# Patient Record
Sex: Female | Born: 1960 | Race: Black or African American | Hispanic: No | Marital: Married | Smoking: Current every day smoker
Health system: Southern US, Community
[De-identification: ages and names within clinical notes are randomized; demographics above are authoritative.]

## PROBLEM LIST (undated history)

## (undated) DIAGNOSIS — I10 Essential (primary) hypertension: Secondary | ICD-10-CM

## (undated) DIAGNOSIS — G4730 Sleep apnea, unspecified: Secondary | ICD-10-CM

## (undated) DIAGNOSIS — Z720 Tobacco use: Secondary | ICD-10-CM

## (undated) DIAGNOSIS — J45909 Unspecified asthma, uncomplicated: Secondary | ICD-10-CM

## (undated) DIAGNOSIS — E119 Type 2 diabetes mellitus without complications: Secondary | ICD-10-CM

## (undated) DIAGNOSIS — M5430 Sciatica, unspecified side: Secondary | ICD-10-CM

## (undated) DIAGNOSIS — J189 Pneumonia, unspecified organism: Secondary | ICD-10-CM

## (undated) DIAGNOSIS — K219 Gastro-esophageal reflux disease without esophagitis: Secondary | ICD-10-CM

## (undated) DIAGNOSIS — G40909 Epilepsy, unspecified, not intractable, without status epilepticus: Secondary | ICD-10-CM

## (undated) DIAGNOSIS — E785 Hyperlipidemia, unspecified: Secondary | ICD-10-CM

## (undated) HISTORY — PX: ENDOVENOUS ABLATION SAPHENOUS VEIN W/ LASER: SUR449

## (undated) HISTORY — DX: Gastro-esophageal reflux disease without esophagitis: K21.9

## (undated) HISTORY — PX: JOINT REPLACEMENT: SHX530

## (undated) HISTORY — PX: FOOT SURGERY: SHX648

## (undated) HISTORY — DX: Type 2 diabetes mellitus without complications: E11.9

## (undated) HISTORY — DX: Unspecified asthma, uncomplicated: J45.909

## (undated) HISTORY — DX: Essential (primary) hypertension: I10

## (undated) HISTORY — DX: Tobacco use: Z72.0

## (undated) HISTORY — PX: REPLACEMENT TOTAL KNEE BILATERAL: SUR1225

---

## 2012-01-04 HISTORY — PX: TOTAL KNEE ARTHROPLASTY: SHX125

## 2013-01-03 HISTORY — PX: ENDOVENOUS ABLATION SAPHENOUS VEIN W/ LASER: SUR449

## 2013-01-03 HISTORY — PX: COLONOSCOPY: SHX174

## 2015-01-04 HISTORY — PX: TOTAL KNEE ARTHROPLASTY: SHX125

## 2019-02-27 ENCOUNTER — Other Ambulatory Visit (HOSPITAL_COMMUNITY): Payer: Self-pay | Admitting: General Practice

## 2019-02-27 DIAGNOSIS — Z1231 Encounter for screening mammogram for malignant neoplasm of breast: Secondary | ICD-10-CM

## 2019-03-05 ENCOUNTER — Other Ambulatory Visit: Payer: Self-pay

## 2019-03-05 ENCOUNTER — Encounter: Payer: Self-pay | Admitting: Orthopaedic Surgery

## 2019-03-05 ENCOUNTER — Ambulatory Visit: Payer: Medicaid Other | Admitting: Orthopaedic Surgery

## 2019-03-05 VITALS — BP 125/77 | HR 85 | Temp 97.2°F | Ht 62.0 in | Wt 232.0 lb

## 2019-03-05 DIAGNOSIS — M25512 Pain in left shoulder: Secondary | ICD-10-CM | POA: Diagnosis not present

## 2019-03-05 DIAGNOSIS — G8929 Other chronic pain: Secondary | ICD-10-CM | POA: Diagnosis not present

## 2019-03-05 NOTE — Progress Notes (Signed)
Subjective:    Patient ID: Alyssa Patrick, female    DOB: 11/27/1960, 59 y.o.   MRN: [id number]  HPI She has four year history of left shoulder pain.  She fell and hurt her shoulder then while living in [place].  She had another injury shortly after the first one.  She was evaluated in [place].  She had MRI there.  She was told she might have rotator cuff injury.  She has now moved to [facility].  I have reviewed the notes from her family doctor and have reviewed the x-rays.  I have independently reviewed and interpreted x-rays of this patient done at another site by another physician or qualified health professional.  She has pain in the left shoulder now with most any motion.  She is taking Mobic.  She uses ice, heat,rubs with no help. She has no numbness. She has no neck pain,no redness.  She has no right shoulder pain.   Review of Systems  Constitutional: Positive for activity change.  Musculoskeletal: Positive for arthralgias, joint swelling and myalgias.  All other systems reviewed and are negative.  For Review of Systems, all other systems reviewed and are negative.  The following is a summary of the past history medically, past history surgically, known current medicines, social history and family history.  This information is gathered electronically by the computer from prior information and documentation.  I review this each visit and have found including this information at this point in the chart is beneficial and informative.   History reviewed. No pertinent past medical history.  Past Surgical History:  Procedure Laterality Date  . FOOT SURGERY    . JOINT REPLACEMENT      Current Outpatient Medications on File Prior to Visit  Medication Sig Dispense Refill  . acetaminophen (TYLENOL) 500 MG tablet Take 500 mg by mouth every 6 (six) hours as needed.    Marland Kitchen albuterol (VENTOLIN HFA) 108 (90 Base) MCG/ACT inhaler Inhale into the lungs.    Marland Kitchen amLODipine (NORVASC) 5 MG tablet Take by  mouth.    . bisacodyl (DULCOLAX) 5 MG EC tablet Take 5 mg by mouth daily as needed for moderate constipation.    . cetirizine (ZYRTEC) 10 MG tablet Take 10 mg by mouth daily.    . clotrimazole (LOTRIMIN) 1 % cream Apply 1 application topically 2 (two) times daily.    . Diclofenac & Menthol-Camphor 75 & 3-3 MG & % THPK by Combination route.    . diphenhydrAMINE (SOMINEX) 25 MG tablet Take 25 mg by mouth at bedtime as needed for sleep.    . divalproex (DEPAKOTE ER) 250 MG 24 hr tablet Take 250 mg by mouth daily.    . ergocalciferol (VITAMIN D2) 1.25 MG (50000 UT) capsule Take 50,000 Units by mouth once a week.    . Fluticasone-Salmeterol (ADVAIR) 100-50 MCG/DOSE AEPB Inhale into the lungs.    . hydrocortisone 2.5 % cream Apply topically 2 (two) times daily.    Marland Kitchen ipratropium-albuterol (DUONEB) 0.5-2.5 (3) MG/3ML SOLN Take 3 mLs by nebulization.    . meloxicam (MOBIC) 15 MG tablet Take by mouth.    . metFORMIN (GLUCOPHAGE) 1000 MG tablet Take by mouth.    . nicotine (NICODERM CQ - DOSED IN MG/24 HOURS) 21 mg/24hr patch Place 21 mg onto the skin daily.    . pantoprazole (PROTONIX) 40 MG tablet Take 40 mg by mouth daily.     No current facility-administered medications on file prior to visit.  Social History   Socioeconomic History  . Marital status: Single    Spouse name: Not on file  . Number of children: Not on file  . Years of education: Not on file  . Highest education level: Not on file  Occupational History  . Not on file  Tobacco Use  . Smoking status: Not on file  Substance and Sexual Activity  . Alcohol use: Not on file  . Drug use: Not on file  . Sexual activity: Not on file  Other Topics Concern  . Not on file  Social History Narrative  . Not on file   Social Determinants of Health   Financial Resource Strain:   . Difficulty of Paying Living Expenses: Not on file  Food Insecurity:   . Worried About Programme researcher, broadcasting/film/video in the Last Year: Not on file  . Ran Out of  Food in the Last Year: Not on file  Transportation Needs:   . Lack of Transportation (Medical): Not on file  . Lack of Transportation (Non-Medical): Not on file  Physical Activity:   . Days of Exercise per Week: Not on file  . Minutes of Exercise per Session: Not on file  Stress:   . Feeling of Stress : Not on file  Social Connections:   . Frequency of Communication with Friends and Family: Not on file  . Frequency of Social Gatherings with Friends and Family: Not on file  . Attends Religious Services: Not on file  . Active Member of Clubs or Organizations: Not on file  . Attends Banker Meetings: Not on file  . Marital Status: Not on file  Intimate Partner Violence:   . Fear of Current or Ex-Partner: Not on file  . Emotionally Abused: Not on file  . Physically Abused: Not on file  . Sexually Abused: Not on file    Family History  Problem Relation Age of Onset  . Cancer Father   . Cancer Brother   . Cancer Paternal Aunt   . Diabetes Maternal Grandmother     BP 125/77   Pulse 85   Temp (!) 97.2 F (36.2 C)   Ht 5\' 2"  (1.575 m)   Wt 232 lb (105.2 kg)   BMI 42.43 kg/m   Body mass index is 42.43 kg/m.     Objective:   Physical Exam Vitals and nursing note reviewed.  Constitutional:      Appearance: She is well-developed.  HENT:     Head: Normocephalic and atraumatic.  Eyes:     Conjunctiva/sclera: Conjunctivae normal.     Pupils: Pupils are equal, round, and reactive to light.  Cardiovascular:     Rate and Rhythm: Normal rate and regular rhythm.  Pulmonary:     Effort: Pulmonary effort is normal.  Abdominal:     Palpations: Abdomen is soft.  Musculoskeletal:       Arms:     Cervical back: Normal range of motion and neck supple.  Skin:    General: Skin is warm and dry.  Neurological:     Mental Status: She is alert and oriented to person, place, and time.     Cranial Nerves: No cranial nerve deficit.     Motor: No abnormal muscle tone.      Coordination: Coordination normal.     Deep Tendon Reflexes: Reflexes are normal and symmetric. Reflexes normal.  Psychiatric:        Behavior: Behavior normal.        Thought  Content: Thought content normal.        Judgment: Judgment normal.           Assessment & Plan:   Encounter Diagnosis  Name Primary?  . Chronic left shoulder pain Yes   PROCEDURE NOTE:  The patient request injection, verbal consent was obtained.  The left shoulder was prepped appropriately after time out was performed.   Sterile technique was observed and injection of 1 cc of Depo-Medrol 40 mg with several cc's of plain xylocaine. Anesthesia was provided by ethyl chloride and a 20-gauge needle was used to inject the shoulder area. A posterior approach was used.  The injection was tolerated well.  A band aid dressing was applied.  The patient was advised to apply ice later today and tomorrow to the injection sight as needed.  I will get a MRI of the left shoulder.  I am concerned about rotator cuff tear or adhesive capsulitis.  Return after the MRI.  Call if any problem.  Precautions discussed.   Electronically Signed Sanjuana Kava, MD 3/2/20213:02 PM

## 2019-03-18 DIAGNOSIS — R569 Unspecified convulsions: Secondary | ICD-10-CM | POA: Insufficient documentation

## 2019-04-02 ENCOUNTER — Other Ambulatory Visit: Payer: Self-pay

## 2019-04-02 ENCOUNTER — Ambulatory Visit
Admission: RE | Admit: 2019-04-02 | Discharge: 2019-04-02 | Disposition: A | Discharge status: Home / Self Care | Payer: Medicaid Other | Source: Ambulatory Visit | Attending: Orthopaedic Surgery | Admitting: Orthopaedic Surgery

## 2019-04-02 DIAGNOSIS — G8929 Other chronic pain: Secondary | ICD-10-CM

## 2019-04-02 DIAGNOSIS — M25512 Pain in left shoulder: Secondary | ICD-10-CM

## 2019-04-16 ENCOUNTER — Ambulatory Visit: Payer: Medicaid Other | Admitting: Orthopaedic Surgery

## 2019-04-16 ENCOUNTER — Other Ambulatory Visit: Payer: Self-pay

## 2019-04-16 ENCOUNTER — Encounter: Payer: Self-pay | Admitting: Orthopaedic Surgery

## 2019-04-16 DIAGNOSIS — G8929 Other chronic pain: Secondary | ICD-10-CM

## 2019-04-16 DIAGNOSIS — M25512 Pain in left shoulder: Secondary | ICD-10-CM

## 2019-04-16 MED ORDER — HYDROCODONE-ACETAMINOPHEN 5-325 MG PO TABS: 1.0000 | ORAL_TABLET | ORAL | 0 refills | Status: AC | PRN

## 2019-04-16 NOTE — Progress Notes (Signed)
[name], female DOB:[birth year], 59 y.o. PPJ:[id number]  Chief Complaint  Patient presents with  . Results    review MRI left shoulder     HPI  Alyssa Patrick is a 59 y.o. female who has continued left shoulder pain.  She had MRI which showed: IMPRESSION: 1. Moderate tendinosis of the supraspinatus tendon with a high-grade partial thickness near complete tear with a few intact fibers with the torn portions retracted 12 mm. 2. Moderate tendinosis of the infraspinatus tendon with a small interstitial tear at the musculotendinous junction. 3. Severe atrophy of the infraspinatus muscle.  I explained the findings to her.  I have independently reviewed the MRI.  I will have her see Dr. Aline Patrick to discuss possible surgery.  She is very agreeable to this.   There is no height or weight on file to calculate BMI.  ROS  Review of Systems  Constitutional: Positive for activity change.  Musculoskeletal: Positive for arthralgias, joint swelling and myalgias.  All other systems reviewed and are negative.   All other systems reviewed and are negative.  The following is a summary of the past history medically, past history surgically, known current medicines, social history and family history.  This information is gathered electronically by the computer from prior information and documentation.  I review this each visit and have found including this information at this point in the chart is beneficial and informative.    History reviewed. No pertinent past medical history.  Past Surgical History:  Procedure Laterality Date  . FOOT SURGERY    . JOINT REPLACEMENT      Family History  Problem Relation Age of Onset  . Cancer Father   . Cancer Brother   . Cancer Paternal Aunt   . Diabetes Maternal Grandmother     Social History Social History   Tobacco Use  . Smoking status: Not on file  Substance Use Topics  . Alcohol use: Not on file  . Drug use: Not on file     Allergies  Allergen Reactions  . Metronidazole Diarrhea  . Penicillin G Hives    Current Outpatient Medications  Medication Sig Dispense Refill  . acetaminophen (TYLENOL) 500 MG tablet Take 500 mg by mouth every 6 (six) hours as needed.    Marland Kitchen albuterol (VENTOLIN HFA) 108 (90 Base) MCG/ACT inhaler Inhale into the lungs.    Marland Kitchen amLODipine (NORVASC) 5 MG tablet Take by mouth.    . bisacodyl (DULCOLAX) 5 MG EC tablet Take 5 mg by mouth daily as needed for moderate constipation.    . cetirizine (ZYRTEC) 10 MG tablet Take 10 mg by mouth daily.    . clotrimazole (LOTRIMIN) 1 % cream Apply 1 application topically 2 (two) times daily.    . Diclofenac & Menthol-Camphor 75 & 3-3 MG & % THPK by Combination route.    . diphenhydrAMINE (SOMINEX) 25 MG tablet Take 25 mg by mouth at bedtime as needed for sleep.    . divalproex (DEPAKOTE ER) 250 MG 24 hr tablet Take 250 mg by mouth daily.    . ergocalciferol (VITAMIN D2) 1.25 MG (50000 UT) capsule Take 50,000 Units by mouth once a week.    . Fluticasone-Salmeterol (ADVAIR) 100-50 MCG/DOSE AEPB Inhale into the lungs.    . hydrocortisone 2.5 % cream Apply topically 2 (two) times daily.    Marland Kitchen ipratropium-albuterol (DUONEB) 0.5-2.5 (3) MG/3ML SOLN Take 3 mLs by nebulization.    . meloxicam (MOBIC) 15 MG tablet Take by mouth.    Marland Kitchen  metFORMIN (GLUCOPHAGE) 1000 MG tablet Take by mouth.    . nicotine (NICODERM CQ - DOSED IN MG/24 HOURS) 21 mg/24hr patch Place 21 mg onto the skin daily.    . pantoprazole (PROTONIX) 40 MG tablet Take 40 mg by mouth daily.    Marland Kitchen HYDROcodone-acetaminophen (NORCO/VICODIN) 5-325 MG tablet Take 1 tablet by mouth every 4 (four) hours as needed for up to 5 days for moderate pain. 30 tablet 0   No current facility-administered medications for this visit.     Physical Exam  There were no vitals taken for this visit.  Constitutional: overall normal hygiene, normal nutrition, well developed, normal grooming, normal body  habitus. Assistive device:none  Musculoskeletal: gait and station Limp none, muscle tone and strength are normal, no tremors or atrophy is present.  .  Neurological: coordination overall normal.  Deep tendon reflex/nerve stretch intact.  Sensation normal.  Cranial nerves II-XII intact.   Skin:   Normal overall no scars, lesions, ulcers or rashes. No psoriasis.  Psychiatric: Alert and oriented x 3.  Recent memory intact, remote memory unclear.  Normal mood and affect. Well groomed.  Good eye contact.  Cardiovascular: overall no swelling, no varicosities, no edema bilaterally, normal temperatures of the legs and arms, no clubbing, cyanosis and good capillary refill.  Lymphatic: palpation is normal.  Left shoulder with decreased and painful ROM.  NV intact.  All other systems reviewed and are negative   The patient has been educated about the nature of the problem(s) and counseled on treatment options.  The patient appeared to understand what I have discussed and is in agreement with it.  Encounter Diagnosis  Name Primary?  . Chronic left shoulder pain Yes    PLAN Call if any problems.  Precautions discussed.  Continue current medications.   Return to clinic to see Dr. Romeo Patrick   I have reviewed the [facility] Controlled Substance Reporting System web site prior to prescribing narcotic medicine for this patient.   Electronically Signed Darreld Mclean, MD 4/13/202110:21 AM

## 2019-04-23 ENCOUNTER — Ambulatory Visit: Payer: Medicaid Other | Admitting: Orthopedic Surgery

## 2019-04-23 ENCOUNTER — Encounter: Payer: Self-pay | Admitting: Orthopedic Surgery

## 2019-04-23 ENCOUNTER — Other Ambulatory Visit: Payer: Self-pay

## 2019-04-23 VITALS — BP 146/98 | HR 84 | Ht 62.0 in | Wt 232.0 lb

## 2019-04-23 DIAGNOSIS — G8929 Other chronic pain: Secondary | ICD-10-CM

## 2019-04-23 DIAGNOSIS — Z6841 Body Mass Index (BMI) 40.0 and over, adult: Secondary | ICD-10-CM

## 2019-04-23 DIAGNOSIS — S46012A Strain of muscle(s) and tendon(s) of the rotator cuff of left shoulder, initial encounter: Secondary | ICD-10-CM | POA: Diagnosis not present

## 2019-04-23 NOTE — Progress Notes (Signed)
Consultation request from Dr. Luna Glasgow  Chief Complaint  Patient presents with  . Shoulder Pain    left surgical consult Dr. Luna Glasgow     History this is a 59 year old female who was followed by Dr. Luna Glasgow after moving from [place] to [place].  4 years ago she fell off of a ladder trying to get a light bulb had a subsequent injury 2 weeks later fell through a door presents with pain weakness and decreased range of motion left shoulder complaining of stiffness and pain anteriorly laterally and posteriorly with some pain radiating usually to the elbow with 1 or 2 occasions radiating to the hand  Her previous treatment includes 7 injections and multiple therapies with no relief  She had risk factors for surgery for rotator cuff repair including smoking 3 cigarettes/day and diabetes  She is already had her MRI and presents for possible surgical treatment  System review she has had 2 total knees she has had multiple foot surgeries she has no chest pain shortness of breath numbness or tingling  Past Medical History:  Diagnosis Date  . Asthma   . Diabetes ([place])   . GERD (gastroesophageal reflux disease)   . High blood pressure   . Tobacco use    Past Surgical History:  Procedure Laterality Date  . FOOT SURGERY    . JOINT REPLACEMENT     Family History  Problem Relation Age of Onset  . Cancer Father   . Cancer Brother   . Cancer Paternal Aunt   . Diabetes Maternal Grandmother    Social History   Tobacco Use  . Smoking status: Current Every Day Smoker    Packs/day: 0.20    Types: Cigarettes  . Smokeless tobacco: Never Used  Substance Use Topics  . Alcohol use: Not on file  . Drug use: Not on file    BP (!) 146/98   Pulse 84   Ht 5\' 2"  (1.575 m)   Wt 232 lb (105.2 kg)   BMI 42.43 kg/m   The patient meets the AMA guidelines for Morbid (severe) obesity with a BMI > 40.0 and I have recommended weight loss.  General appearance normal development nutrition hygiene  grooming with no deformities endomorphic in body habitus  Peripheral vascular system no swelling no varicose veins in her upper extremities pulses and temperature normal without edema or tenderness.  Lymph nodes cervical region normal  Gait and Station no abnormalities  Inspection of the right shoulder shows full range of motion with no tenderness or swelling shoulder stable with normal strength and muscle tone skin is intact  Left shoulder tenderness over the biceps tendon rotator interval lateral deltoid and infra scapular beneath the spine with her arm at her side she has 50 degrees external rotation I was able to abduct her arm easily to 90 degrees but it was painful flexion was 100 degrees passive before pain and she had 80 degrees of her own flexion with normal extension no instability was noted she had weakness in the supraspinatus and infraspinatus but normal muscle tone no atrophy normal skin  Sensation was intact she was oriented x3 mood and affect were pleasant and normal no pathologic reflexes or coordination deficits were noted  MRI and imaging  Moderate tendinosis of the supraspinatus tendon with high-grade partial-thickness near complete tear with a few intact fibers retracted about 12 mm.  Tendinosis of the infraspinatus tendon with small interstitial tear at the musculotendinous junction and atrophy of the infraspinatus muscle  The independent interpretation of this MRI is that she has a full-thickness tear 1.2 cm retracted.  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  . Chronic left shoulder pain Yes  . Body mass index 40.0-44.9, adult (HCC)   . Morbid obesity (HCC)   . Traumatic complete tear of left rotator cuff, initial encounter    The procedure has been fully reviewed with  the patient; The risks and benefits of surgery have been discussed and explained and understood. Alternative treatment has also been reviewed, questions were encouraged and answered. The postoperative plan is also been reviewed.  Patient aware that she is at risk for nonhealing because of her smoking and diabetes history and she is aware that full functional recovery may take a year  Plan open rotator cuff repair left shoulder

## 2019-04-23 NOTE — Patient Instructions (Signed)
Surgery for Rotator Cuff Tear  Rotator cuff surgery is only recommended for individuals who have experienced persistent disability for greater than 3 months of non-surgical (conservative) treatment. Surgery is not necessary but is recommended for individuals who experience difficulty completing daily activities or athletes who are unable to compete. Rotator cuff tears do not usually heal without surgical intervention. If left alone small rotator cuff tears usually become larger. Younger athletes who have a rotator cuff tear may be recommended for surgery without attempting conservative rehabilitation. The purpose of surgery is to regain function of the shoulder joint and eliminate pain associated with the injury. In addition to repairing the tendon tear, the surgery may often remove a portion of the bony roof of the shoulder (acromion) as well as the chronically thickened and inflamed membrane below the acromion (subacromial bursa). REASONS NOT TO OPERATE  Infection of the shoulder. Inability to complete a rehabilitation program. Patients who have other conditions (emotional or psychological) conditions that contribute to their shoulder condition. RISKS AND COMPLICATIONS Infection. Re-tear of the rotator cuff tendons or muscles. Shoulder stiffness and/or weakness. Inability to compete in athletics. Acromioclavicular (AC) joint pain Risks of surgery: infection, bleeding, nerve damage, or damage to surrounding tissues. TECHNIQUE There are different surgical procedures used to treat rotator cuff tears. The type of procedure depends on the extent of injury as well as the surgeon's preference. All of the surgical techniques for rotator cuff tears have the same goal of repairing the torn tendon, removing part of the acromion, and removing the subacromial bursa. There are two main types of procedures: arthroscopic and open incision. Arthroscopic procedures are usually completed and you go home the same day  as surgery (outpatient). These procedures use multiple small incisions in which tools and a video camera are placed to work on the shoulder. An electric shaver removes the bursa, then a power burr shaves down the portion of the acromion that places pressure on the rotator cuff. Finally the rotator cuff is sewed (sutured) back to the humeral head. Open incision procedures require a larger incision. The deltoid muscle is detached from the acromion and a ligament in the shoulder (coracoacromial) is cut in order for the surgeon to access the rotator cuff. The subacromial bursa is removed as well as part of the acromion to give the rotator cuff room to move freely. The torn tendon is then sutured to the humeral head. After the rotator cuff is repaired, then the deltoid is reattached and the incision is closed up.  RECOVERY  Post-operative care depends on the surgical technique and the preferences of your therapist. Keep the wound clean and dry for the first 10 to 14 days after surgery. Keep your shoulder and arm in the sling provided to you for as long as you have been instructed to. You will be given pain medications by your caregiver. Passive (without using muscles) shoulder movements may be begun when instructed. It is important to follow through with you rehabilitation program in order to have the best possible recovery. RETURN TO SPORTS  The rehabilitation period will depend on the sport and position you play as well as the success of the operation. The minimum recovery period is 6 months. You must have regained complete shoulder motion and strength before returning to sports. SEEK IMMEDIATE MEDICAL CARE IF:  Any medications produce adverse side effects. Any complications from surgery occur: Pain, numbness, or coldness in the extremity operated upon. Discoloration of the nail beds (they become blue or gray) of   the extremity operated upon. Signs of infections (fever, pain, inflammation, redness, or  persistent bleeding).   You have decided to proceed with rotator cuff repair surgery. You have decided not to continue with nonoperative measures such as but not limited to oral medication,   activity modification, physical therapy, bracing, or injection.  We will perform rotator cuff repair. Some of the risks associated with rotator cuff repair include but are not limited to Bleeding Infection Swelling Stiffness Blood clot Pain Re-tearing of the rotator cuff Failure of the rotator cuff to heal   If you're not comfortable with these risks and would like to continue with nonoperative treatment please let Dr. Keval Nam know prior to your surgery.  

## 2019-05-02 ENCOUNTER — Other Ambulatory Visit: Payer: Self-pay | Admitting: Orthopedic Surgery

## 2019-05-02 ENCOUNTER — Telehealth: Payer: Self-pay | Admitting: Orthopedic Surgery

## 2019-05-02 ENCOUNTER — Telehealth: Payer: Self-pay | Admitting: Radiology

## 2019-05-02 DIAGNOSIS — M25512 Pain in left shoulder: Secondary | ICD-10-CM

## 2019-05-02 DIAGNOSIS — G8929 Other chronic pain: Secondary | ICD-10-CM

## 2019-05-02 MED ORDER — TRAMADOL HCL 50 MG PO TABS: 50.0000 mg | ORAL_TABLET | Freq: Four times a day (QID) | ORAL | 5 refills | Status: DC | PRN

## 2019-05-02 MED ORDER — IBUPROFEN 800 MG PO TABS: 800.0000 mg | ORAL_TABLET | Freq: Three times a day (TID) | ORAL | 1 refills | Status: DC | PRN

## 2019-05-02 MED ORDER — TRAMADOL HCL 50 MG PO TABS: 50.0000 mg | ORAL_TABLET | Freq: Four times a day (QID) | ORAL | 0 refills | Status: AC | PRN

## 2019-05-02 NOTE — Telephone Encounter (Signed)
Patient called - relays that she is "in excruciating pain with the shoulder"; aware of her surgery date of 05/16/19.  She is asking if Dr Romeo Apple can prescribe something to help with the pain in the meantime? If so, her pharmacy is [facility], [address], [place].

## 2019-05-02 NOTE — Telephone Encounter (Signed)
I called pharmacy To see if they can send through quantity 20 instead of 60 for her / has been rejected for prior authorization  Medicaid can only have 5 days supply

## 2019-05-02 NOTE — Progress Notes (Signed)
Meds ordered this encounter  Medications  . ibuprofen (ADVIL) 800 MG tablet    Sig: Take 1 tablet (800 mg total) by mouth every 8 (eight) hours as needed.    Dispense:  90 tablet    Refill:  1  . traMADol (ULTRAM) 50 MG tablet    Sig: Take 1 tablet (50 mg total) by mouth every 6 (six) hours as needed.    Dispense:  60 tablet    Refill:  5

## 2019-05-02 NOTE — Telephone Encounter (Signed)
Of tramadol right

## 2019-05-02 NOTE — Telephone Encounter (Signed)
Sent meds to pharm

## 2019-05-02 NOTE — Progress Notes (Signed)
Meds ordered this encounter  Medications   traMADol (ULTRAM) 50 MG tablet    Sig: Take 1 tablet (50 mg total) by mouth every 6 (six) hours as needed for up to 5 days.    Dispense:  20 tablet    Refill:  0    

## 2019-05-03 NOTE — Telephone Encounter (Signed)
Yes, sorry, Tramadol 5 day supply only.I changed to 5 day supply, left refills intact.

## 2019-05-13 NOTE — Patient Instructions (Signed)
Alyssa Patrick  05/13/2019     @   Your procedure is scheduled on Thursday, May 13.  Report to [facility] at 0800 A.M.  Call this number if you have problems the morning of surgery:  [phone]   Remember:  Do not eat or drink after midnight.     Take these medicines the morning of surgery with A SIP OF WATER albuterol, amlodipine, atorvastatin, zyrtec, norco if needed, duoneb, protonix, advair, and depakote  Do not wear jewelry, make-up or nail polish.  Do not wear lotions, powders, or perfumes, or deodorant.  Do not shave 48 hours prior to surgery.  Men may shave face and neck.  Do not bring valuables to the hospital.  [facility] is not responsible for any belongings or valuables.  Contacts, dentures or bridgework may not be worn into surgery.  Leave your suitcase in the car.  After surgery it may be brought to your room.  For patients admitted to the hospital, discharge time will be determined by your treatment team.  Patients discharged the day of surgery will not be allowed to drive home.   Name and phone number of your driver:   family Special instructions:  none  Please read over the following fact sheets that you were given. Surgical Site Infection Prevention, Anesthesia Post-op Instructions and Care and Recovery After Surgery      Rotator Cuff Tear Rehab After Surgery Ask your health care provider which exercises are safe for you. Do exercises exactly as told by your health care provider and adjust them as directed. It is normal to feel mild stretching, pulling, tightness, or discomfort as you do these exercises. Stop right away if you feel sudden pain or your pain gets worse. Do not begin these exercises until told by your health care provider. Stretching and range-of-motion exercises These exercises warm up your muscles and joints and improve the movement and flexibility of your shoulder. These exercises also help to relieve pain. Shoulder  pendulum In this exercise, you let the injured arm dangle toward the floor and then swing it like a clock pendulum. 1. Stand near a table or counter that you can hold onto for balance. 2. Bend forward at the waist and let your left / right arm hang straight down. Use your other arm to support you and help you stay balanced. 3. Relax your left / right arm and shoulder muscles, and move your hips and your trunk so your left / right arm swings freely. Your arm should swing because of the motion of your body, not because you are using your arm or shoulder muscles. 4. Keep moving your hips and trunk so your arm swings in the following directions, as told by your health care provider: ? Side to side. ? Forward and backward. ? In clockwise and counterclockwise circles. 5. Slowly return to the starting position. Repeat __________ times, or for __________ seconds per direction. Complete this exercise __________ times a day. Shoulder flexion, seated In this exercise, you raise your arm in front of your body until you feel a stretch in your injured shoulder. 1. Sit in a stable chair so your left / right forearm can rest on a flat surface. Your elbow should rest at a height that keeps your upper arm next to your body. 2. Keeping your left / right shoulder relaxed, lean forward at the waist and let your hand slide forward (flexion). Stop when you feel a stretch in your shoulder, or when  you reach the angle that is recommended by your health care provider. 3. Hold for __________ seconds. 4. Slowly return to the starting position. Repeat __________ times. Complete this exercise __________ times a day. Shoulder flexion, standing In this exercise, you raise your arm in front of your body (flexion) until you feel a stretch in your injured shoulder. 1. Stand and hold a broomstick, a cane, or a similar object. Place your hands a little more than shoulder width apart on the object. Your left / right hand should be  palm-up, and your other hand should be palm-down. 2. Keep your elbow straight and your shoulder muscles relaxed. Push the stick up with your healthy arm to raise your left / right arm in front of your body, and then over your head until you feel a stretch in your shoulder. ? Avoid shrugging your shoulder while you raise your arm. Keep your shoulder blade tucked down toward the middle of your back. ? Keep your left / right shoulder muscles relaxed. 3. Hold for __________ seconds. 4. Slowly return to the starting position. Repeat __________ times. Complete this exercise __________ times a day. Shoulder abduction, active-assisted You will need a stick, broom handle, or similar object to help you (assist) in doing this exercise. 1. Lie on your back. This is the supine position. Hold a broomstick, a cane, or a similar object. 2. Place your hands a little more than shoulder width apart on the object. Your left / right hand should be palm-up, and your other hand should be palm-down. 3. Keeping your shoulder relaxed, push the stick to raise your left / right arm out to your side (abduction) and then over your head. Use your other hand to help move the stick. Stop when you feel a stretch in your shoulder, or when you reach the angle that is recommended by your health care provider. ? Avoid shrugging your shoulder while you raise your arm. Keep your shoulder blade tucked down toward the middle of your back. 4. Hold for __________ seconds. 5. Slowly return to the starting position. Repeat __________ times. Complete this exercise __________ times a day. Shoulder flexion, active-assisted  1. Lie on your back. You may bend your knees for comfort. 2. Hold a broomstick, a cane, or a similar object so that your hands are about shoulder width apart. Your palms should face toward your feet. 3. Raise your left / right arm over your head, then behind your head toward the floor (flexion). Use your other hand to help  you do this (active-assisted). Stop when you feel a gentle stretch in your shoulder, or when you reach the angle that is recommended by your health care provider. 4. Hold for __________ seconds. 5. Use the stick and your other arm to help you return your left / right arm to the starting position. Repeat __________ times. Complete this exercise __________ times a day. External rotation  1. Sit in a stable chair without armrests, or stand up. 2. Tuck a soft object, such as a folded towel or a small ball, under your left / right upper arm. 3. Hold a broomstick, a cane, or a similar object with your palms face-down, toward the floor. Bend your elbows to a 90-degree angle (right angle), and keep your hands about shoulder width apart. 4. Straighten your healthy arm and push the stick across your body, toward your left / right side. Keep your left / right arm bent. This will rotate your left / right forearm away  from your body (external rotation). 5. Hold for __________ seconds. 6. Slowly return to the starting position. Repeat __________ times. Complete this exercise __________ times a day. Strengthening exercises These exercises build strength and endurance in your shoulder. Endurance is the ability to use your muscles for a long time, even after they get tired. Shoulder flexion, isometric  1. Stand or sit in a doorway, facing the door frame. 2. Keep your left / right arm straight and make a gentle fist with your hand. Place your fist against the door frame. Only your fist should be touching the frame. Keep your upper arm at your side. 3. Gently press your fist against the door frame, as if you are trying to raise your arm above your head (isometric shoulder flexion). ? Avoid shrugging your shoulder while you press your hand into the door frame. Keep your shoulder blade tucked down toward the middle of your back. 4. Hold for __________ seconds. 5. Slowly release the tension, and relax your muscles  completely before you repeat the exercise. Repeat __________ times. Complete this exercise __________ times a day. Shoulder abduction, isometric 1. Stand or sit in a doorway. Your left / right arm should be closest to the door frame. 2. Keep your left / right arm straight, and place the back of your hand against the door frame. Only your hand should be touching the frame. Keep the rest of your arm close to your side. 3. Gently press the back of your hand against the door frame, as if you are trying to raise your arm out to the side (isometric shoulder abduction). ? Avoid shrugging your shoulder while you press your hand into the door frame. Keep your shoulder blade tucked down toward the middle of your back. 4. Hold for __________ seconds. 5. Slowly release the tension, and relax your muscles completely before you repeat the exercise. Repeat __________ times. Complete this exercise __________ times a day. Internal rotation, isometric This is an exercise in which you press your palm against a door frame without moving your shoulder joint (isometric). 1. Stand or sit in a doorway, facing the door frame. 2. Bend your left / right elbow, and place the palm of your hand against the door frame. Only your palm should be touching the frame. Keep your upper arm at your side. 3. Gently press your hand against the door frame, as if you are trying to push your arm toward your abdomen (internal rotation). Gradually increase the pressure until you are pressing as hard as you can. Stop increasing the pressure if you feel shoulder pain. ? Avoid shrugging your shoulder while you press your hand into the door frame. Keep your shoulder blade tucked down toward the middle of your back. 4. Hold for __________ seconds. 5. Slowly release the tension, and relax your muscles completely before you repeat the exercise. Repeat __________ times. Complete this exercise __________ times a day. External rotation, isometric This  is an exercise in which you press the back of your wrist against a door frame without moving your shoulder joint (isometric). 1. Stand or sit in a doorway, facing the door frame. 2. Bend your left / right elbow and place the back of your wrist against the door frame. Only the back of your wrist should be touching the frame. Keep your upper arm at your side. 3. Gently press your wrist against the door frame, as if you are trying to push your arm away from your abdomen (external rotation). Gradually increase  the pressure until you are pressing as hard as you can. Stop increasing the pressure if you feel pain. ? Avoid shrugging your shoulder while you press your wrist into the door frame. Keep your shoulder blade tucked down toward the middle of your back. 4. Hold for __________ seconds. 5. Slowly release the tension, and relax your muscles completely before you repeat the exercise. Repeat __________ times. Complete this exercise __________ times a day. This information is not intended to replace advice given to you by your health care provider. Make sure you discuss any questions you have with your health care provider. Document Revised: 04/13/2018 Document Reviewed: 04/03/2018 Elsevier Patient Education  2020 [employer] General Anesthesia, Adult General anesthesia is the use of medicines to make a person "go to sleep" (unconscious) for a medical procedure. General anesthesia must be used for certain procedures, and is often recommended for procedures that:  Last a long time.  Require you to be still or in an unusual position.  Are major and can cause blood loss. The medicines used for general anesthesia are called general anesthetics. As well as making you unconscious for a certain amount of time, these medicines:  Prevent pain.  Control your blood pressure.  Relax your muscles. Tell a health care provider about:  Any allergies you have.  All medicines you are taking, including  vitamins, herbs, eye drops, creams, and over-the-counter medicines.  Any problems you or family members have had with anesthetic medicines.  Types of anesthetics you have had in the past.  Any blood disorders you have.  Any surgeries you have had.  Any medical conditions you have.  Any recent upper respiratory, chest, or ear infections.  Any history of: ? Heart or lung conditions, such as heart failure, sleep apnea, asthma, or chronic obstructive pulmonary disease (COPD). ? Financial planner. ? Depression or anxiety.  Any tobacco or drug use, including marijuana or alcohol use.  Whether you are pregnant or may be pregnant. What are the risks? Generally, this is a safe procedure. However, problems may occur, including:  Allergic reaction.  Lung and heart problems.  Inhaling food or liquid from the stomach into the lungs (aspiration).  Nerve injury.  Dental injury.  Air in the bloodstream, which can lead to stroke.  Extreme agitation or confusion (delirium) when you wake up from the anesthetic.  Waking up during your procedure and being unable to move. This is rare. These problems are more likely to develop if you are having a major surgery or if you have an advanced or serious medical condition. You can prevent some of these complications by answering all of your health care provider's questions thoroughly and by following all instructions before your procedure. General anesthesia can cause side effects, including:  Nausea or vomiting.  A sore throat from the breathing tube.  Hoarseness.  Wheezing or coughing.  Shaking chills.  Tiredness.  Body aches.  Anxiety.  Sleepiness or drowsiness.  Confusion or agitation. What happens before the procedure? Staying hydrated Follow instructions from your health care provider about hydration, which may include:  Up to 2 hours before the procedure - you may continue to drink clear liquids, such as water, clear fruit  juice, black coffee, and plain tea.  Eating and drinking restrictions Follow instructions from your health care provider about eating and drinking, which may include:  8 hours before the procedure - stop eating heavy meals or foods such as meat, fried foods, or fatty foods.  6 hours before  the procedure - stop eating light meals or foods, such as toast or cereal.  6 hours before the procedure - stop drinking milk or drinks that contain milk.  2 hours before the procedure - stop drinking clear liquids. Medicines Ask your health care provider about:  Changing or stopping your regular medicines. This is especially important if you are taking diabetes medicines or blood thinners.  Taking medicines such as aspirin and ibuprofen. These medicines can thin your blood. Do not take these medicines unless your health care provider tells you to take them.  Taking over-the-counter medicines, vitamins, herbs, and supplements. Do not take these during the week before your procedure unless your health care provider approves them. General instructions  Starting 3-6 weeks before the procedure, do not use any products that contain nicotine or tobacco, such as cigarettes and e-cigarettes. If you need help quitting, ask your health care provider.  If you brush your teeth on the morning of the procedure, make sure to spit out all of the toothpaste.  Tell your health care provider if you become ill or develop a cold, cough, or fever.  If instructed by your health care provider, bring your sleep apnea device with you on the day of your surgery (if applicable).  Ask your health care provider if you will be going home the same day, the following day, or after a longer hospital stay. ? Plan to have someone take you home from the hospital or clinic. ? Plan to have a responsible adult care for you for at least 24 hours after you leave the hospital or clinic. This is important. What happens during the  procedure?   You will be given anesthetics through both of the following: ? A mask placed over your nose and mouth. ? An IV in one of your veins.  You may receive a medicine to help you relax (sedative).  After you are unconscious, a breathing tube may be inserted down your throat to help you breathe. This will be removed before you wake up.  An anesthesia specialist will stay with you throughout your procedure. He or she will: ? Keep you comfortable and safe by continuing to give you medicines and adjusting the amount of medicine that you get. ? Monitor your blood pressure, pulse, and oxygen levels to make sure that the anesthetics do not cause any problems. The procedure may vary among health care providers and hospitals. What happens after the procedure?  Your blood pressure, temperature, heart rate, breathing rate, and blood oxygen level will be monitored until the medicines you were given have worn off.  You will wake up in a recovery area. You may wake up slowly.  If you feel anxious or agitated, you may be given medicine to help you calm down.  If you will be going home the same day, your health care provider may check to make sure you can walk, drink, and urinate.  Your health care provider will treat any pain or side effects you have before you go home.  Do not drive for 24 hours if you were given a sedative. Summary  General anesthesia is used to keep you still and prevent pain during a procedure.  It is important to tell your health care provider about your medical history and any surgeries you have had, and previous experience with anesthesia.  Follow your health care provider's instructions about when to stop eating, drinking, or taking certain medicines before your procedure.  Plan to have someone take  you home from the hospital or clinic. This information is not intended to replace advice given to you by your health care provider. Make sure you discuss any  questions you have with your health care provider. Document Revised: 05/09/2017 Document Reviewed: 08/05/2016 Elsevier Patient Education  [employer] Anesthesia, Adult, Care After This sheet gives you information about how to care for yourself after your procedure. Your health care provider may also give you more specific instructions. If you have problems or questions, contact your health care provider. What can I expect after the procedure? After the procedure, the following side effects are common:  Pain or discomfort at the IV site.  Nausea.  Vomiting.  Sore throat.  Trouble concentrating.  Feeling cold or chills.  Weak or tired.  Sleepiness and fatigue.  Soreness and body aches. These side effects can affect parts of the body that were not involved in surgery. Follow these instructions at home:  For at least 24 hours after the procedure:  Have a responsible adult stay with you. It is important to have someone help care for you until you are awake and alert.  Rest as needed.  Do not: ? Participate in activities in which you could fall or become injured. ? Drive. ? Use heavy machinery. ? Drink alcohol. ? Take sleeping pills or medicines that cause drowsiness. ? Make important decisions or sign legal documents. ? Take care of children on your own. Eating and drinking  Follow any instructions from your health care provider about eating or drinking restrictions.  When you feel hungry, start by eating small amounts of foods that are soft and easy to digest (bland), such as toast. Gradually return to your regular diet.  Drink enough fluid to keep your urine pale yellow.  If you vomit, rehydrate by drinking water, juice, or clear broth. General instructions  If you have sleep apnea, surgery and certain medicines can increase your risk for breathing problems. Follow instructions from your health care provider about wearing your sleep device: ? Anytime  you are sleeping, including during daytime naps. ? While taking prescription pain medicines, sleeping medicines, or medicines that make you drowsy.  Return to your normal activities as told by your health care provider. Ask your health care provider what activities are safe for you.  Take over-the-counter and prescription medicines only as told by your health care provider.  If you smoke, do not smoke without supervision.  Keep all follow-up visits as told by your health care provider. This is important. Contact a health care provider if:  You have nausea or vomiting that does not get better with medicine.  You cannot eat or drink without vomiting.  You have pain that does not get better with medicine.  You are unable to pass urine.  You develop a skin rash.  You have a fever.  You have redness around your IV site that gets worse. Get help right away if:  You have difficulty breathing.  You have chest pain.  You have blood in your urine or stool, or you vomit blood. Summary  After the procedure, it is common to have a sore throat or nausea. It is also common to feel tired.  Have a responsible adult stay with you for the first 24 hours after general anesthesia. It is important to have someone help care for you until you are awake and alert.  When you feel hungry, start by eating small amounts of foods that are soft and easy  to digest (bland), such as toast. Gradually return to your regular diet.  Drink enough fluid to keep your urine pale yellow.  Return to your normal activities as told by your health care provider. Ask your health care provider what activities are safe for you. This information is not intended to replace advice given to you by your health care provider. Make sure you discuss any questions you have with your health care provider. Document Revised: 12/23/2016 Document Reviewed: 08/05/2016 Elsevier Patient Education  2020 [employer].  How to Use  Chlorhexidine for Bathing Chlorhexidine gluconate (CHG) is a germ-killing (antiseptic) solution that is used to clean the skin. It can get rid of the bacteria that normally live on the skin and can keep them away for about 24 hours. To clean your skin with CHG, you may be given:  A CHG solution to use in the shower or as part of a sponge bath.  A prepackaged cloth that contains CHG. Cleaning your skin with CHG may help lower the risk for infection:  While you are staying in the intensive care unit of the hospital.  If you have a vascular access, such as a central line, to provide short-term or long-term access to your veins.  If you have a catheter to drain urine from your bladder.  If you are on a ventilator. A ventilator is a machine that helps you breathe by moving air in and out of your lungs.  After surgery. What are the risks? Risks of using CHG include:  A skin reaction.  Hearing loss, if CHG gets in your ears.  Eye injury, if CHG gets in your eyes and is not rinsed out.  The CHG product catching fire. Make sure that you avoid smoking and flames after applying CHG to your skin. Do not use CHG:  If you have a chlorhexidine allergy or have previously reacted to chlorhexidine.  On babies younger than 54 months of age. How to use CHG solution  Use CHG only as told by your health care provider, and follow the instructions on the label.  Use the full amount of CHG as directed. Usually, this is one bottle. During a shower Follow these steps when using CHG solution during a shower (unless your health care provider gives you different instructions): 2. Start the shower. 3. Use your normal soap and shampoo to wash your face and hair. 4. Turn off the shower or move out of the shower stream. 5. Pour the CHG onto a clean washcloth. Do not use any type of brush or rough-edged sponge. 6. Starting at your neck, lather your body down to your toes. Make sure you follow these  instructions: ? If you will be having surgery, pay special attention to the part of your body where you will be having surgery. Scrub this area for at least 1 minute. ? Do not use CHG on your head or face. If the solution gets into your ears or eyes, rinse them well with water. ? Avoid your genital area. ? Avoid any areas of skin that have broken skin, cuts, or scrapes. ? Scrub your back and under your arms. Make sure to wash skin folds. 7. Let the lather sit on your skin for 1-2 minutes or as long as told by your health care provider. 8. Thoroughly rinse your entire body in the shower. Make sure that all body creases and crevices are rinsed well. 9. Dry off with a clean towel. Do not put any substances on your  body afterward--such as powder, lotion, or perfume--unless you are told to do so by your health care provider. Only use lotions that are recommended by the manufacturer. 10. Put on clean clothes or pajamas. 11. If it is the night before your surgery, sleep in clean sheets.  During a sponge bath Follow these steps when using CHG solution during a sponge bath (unless your health care provider gives you different instructions): 5. Use your normal soap and shampoo to wash your face and hair. 6. Pour the CHG onto a clean washcloth. 7. Starting at your neck, lather your body down to your toes. Make sure you follow these instructions: ? If you will be having surgery, pay special attention to the part of your body where you will be having surgery. Scrub this area for at least 1 minute. ? Do not use CHG on your head or face. If the solution gets into your ears or eyes, rinse them well with water. ? Avoid your genital area. ? Avoid any areas of skin that have broken skin, cuts, or scrapes. ? Scrub your back and under your arms. Make sure to wash skin folds. 8. Let the lather sit on your skin for 1-2 minutes or as long as told by your health care provider. 9. Using a different clean, wet  washcloth, thoroughly rinse your entire body. Make sure that all body creases and crevices are rinsed well. 10. Dry off with a clean towel. Do not put any substances on your body afterward--such as powder, lotion, or perfume--unless you are told to do so by your health care provider. Only use lotions that are recommended by the manufacturer. 11. Put on clean clothes or pajamas. 12. If it is the night before your surgery, sleep in clean sheets. How to use CHG prepackaged cloths  Only use CHG cloths as told by your health care provider, and follow the instructions on the label.  Use the CHG cloth on clean, dry skin.  Do not use the CHG cloth on your head or face unless your health care provider tells you to.  When washing with the CHG cloth: ? Avoid your genital area. ? Avoid any areas of skin that have broken skin, cuts, or scrapes. Before surgery Follow these steps when using a CHG cloth to clean before surgery (unless your health care provider gives you different instructions): 6. Using the CHG cloth, vigorously scrub the part of your body where you will be having surgery. Scrub using a back-and-forth motion for 3 minutes. The area on your body should be completely wet with CHG when you are done scrubbing. 7. Do not rinse. Discard the cloth and let the area air-dry. Do not put any substances on the area afterward, such as powder, lotion, or perfume. 8. Put on clean clothes or pajamas. 9. If it is the night before your surgery, sleep in clean sheets.  For general bathing Follow these steps when using CHG cloths for general bathing (unless your health care provider gives you different instructions). 5. Use a separate CHG cloth for each area of your body. Make sure you wash between any folds of skin and between your fingers and toes. Wash your body in the following order, switching to a new cloth after each step: ? The front of your neck, shoulders, and chest. ? Both of your arms, under your  arms, and your hands. ? Your stomach and groin area, avoiding the genitals. ? Your right leg and foot. ? Your left leg and  foot. ? The back of your neck, your back, and your buttocks. 6. Do not rinse. Discard the cloth and let the area air-dry. Do not put any substances on your body afterward--such as powder, lotion, or perfume--unless you are told to do so by your health care provider. Only use lotions that are recommended by the manufacturer. 7. Put on clean clothes or pajamas. Contact a health care provider if:  Your skin gets irritated after scrubbing.  You have questions about using your solution or cloth. Get help right away if:  Your eyes become very red or swollen.  Your eyes itch badly.  Your skin itches badly and is red or swollen.  Your hearing changes.  You have trouble seeing.  You have swelling or tingling in your mouth or throat.  You have trouble breathing.  You swallow any chlorhexidine. Summary  Chlorhexidine gluconate (CHG) is a germ-killing (antiseptic) solution that is used to clean the skin. Cleaning your skin with CHG may help to lower your risk for infection.  You may be given CHG to use for bathing. It may be in a bottle or in a prepackaged cloth to use on your skin. Carefully follow your health care provider's instructions and the instructions on the product label.  Do not use CHG if you have a chlorhexidine allergy.  Contact your health care provider if your skin gets irritated after scrubbing. This information is not intended to replace advice given to you by your health care provider. Make sure you discuss any questions you have with your health care provider. Document Revised: 03/08/2018 Document Reviewed: 11/17/2016 Elsevier Patient Education  2020 [employer].

## 2019-05-14 ENCOUNTER — Encounter (HOSPITAL_COMMUNITY)
Admission: RE | Admit: 2019-05-14 | Discharge: 2019-05-14 | Disposition: A | Discharge status: Home / Self Care | Payer: Medicaid Other | Source: Ambulatory Visit | Attending: Orthopedic Surgery | Admitting: Orthopedic Surgery

## 2019-05-14 ENCOUNTER — Encounter (HOSPITAL_COMMUNITY): Payer: Self-pay

## 2019-05-14 ENCOUNTER — Other Ambulatory Visit (HOSPITAL_COMMUNITY)
Admission: RE | Admit: 2019-05-14 | Discharge: 2019-05-14 | Disposition: A | Discharge status: Home / Self Care | Payer: Medicaid Other | Source: Ambulatory Visit | Attending: Orthopedic Surgery | Admitting: Orthopedic Surgery

## 2019-05-14 ENCOUNTER — Other Ambulatory Visit: Payer: Self-pay

## 2019-05-14 DIAGNOSIS — Z01812 Encounter for preprocedural laboratory examination: Secondary | ICD-10-CM | POA: Insufficient documentation

## 2019-05-14 DIAGNOSIS — I10 Essential (primary) hypertension: Secondary | ICD-10-CM | POA: Insufficient documentation

## 2019-05-14 HISTORY — DX: Sleep apnea, unspecified: G47.30

## 2019-05-14 LAB — BASIC METABOLIC PANEL
Anion gap: 9 (ref 5–15)
BUN: 14 mg/dL (ref 8–23)
CO2: 24 mmol/L (ref 22–32)
Calcium: 8.9 mg/dL (ref 8.9–10.3)
Chloride: 105 mmol/L (ref 98–111)
Creatinine, Ser: 0.76 mg/dL (ref 0.44–1.00)
GFR calc Af Amer: >60 mL/min (ref >60)
GFR calc non Af Amer: >60 mL/min (ref >60)
Glucose, Bld: 126 mg/dL — ABNORMAL HIGH (ref 70–99)
Potassium: 4.4 mmol/L (ref 3.5–5.1)
Sodium: 138 mmol/L (ref 135–145)

## 2019-05-14 LAB — CBC WITH DIFFERENTIAL/PLATELET
Abs Immature Granulocytes: 0.03 10*3/uL (ref 0.00–0.07)
Basophils Absolute: 0.1 10*3/uL (ref 0.0–0.1)
Basophils Relative: 1 %
Eosinophils Absolute: 0.5 10*3/uL (ref 0.0–0.5)
Eosinophils Relative: 6 %
HCT: 42.6 % (ref 36.0–46.0)
Hemoglobin: 12.6 g/dL (ref 12.0–15.0)
Immature Granulocytes: 0 %
Lymphocytes Relative: 33 %
Lymphs Abs: 2.6 10*3/uL (ref 0.7–4.0)
MCH: 22.9 pg — ABNORMAL LOW (ref 26.0–34.0)
MCHC: 29.6 g/dL — ABNORMAL LOW (ref 30.0–36.0)
MCV: 77.3 fL — ABNORMAL LOW (ref 80.0–100.0)
Monocytes Absolute: 0.7 10*3/uL (ref 0.1–1.0)
Monocytes Relative: 9 %
Neutro Abs: 4 10*3/uL (ref 1.7–7.7)
Neutrophils Relative %: 51 %
Platelets: 267 10*3/uL (ref 150–400)
RBC: 5.51 MIL/uL — ABNORMAL HIGH (ref 3.87–5.11)
RDW: 14.6 % (ref 11.5–15.5)
WBC: 7.9 10*3/uL (ref 4.0–10.5)
nRBC: 0 % (ref 0.0–0.2)

## 2019-05-14 LAB — HEMOGLOBIN A1C
Hgb A1c MFr Bld: 8.8 % — ABNORMAL HIGH (ref 4.8–5.6)
Mean Plasma Glucose: 205.86 mg/dL

## 2019-05-15 ENCOUNTER — Other Ambulatory Visit (HOSPITAL_COMMUNITY)
Admission: RE | Admit: 2019-05-15 | Discharge: 2019-05-15 | Disposition: A | Discharge status: Home / Self Care | Payer: Medicaid Other | Source: Ambulatory Visit | Attending: Orthopedic Surgery | Admitting: Orthopedic Surgery

## 2019-05-15 NOTE — H&P (Signed)
Chief Complaint  Patient presents with  . Shoulder Pain      left surgical consult Dr. Hilda Lias       History this is a 59 year old female who was followed by Dr. Hilda Lias after moving from [place] to [place].  4 years ago she fell off of a ladder trying to get a light bulb had a subsequent injury 2 weeks later fell through a door presents with pain weakness and decreased range of motion left shoulder complaining of stiffness and pain anteriorly laterally and posteriorly with some pain radiating usually to the elbow with 1 or 2 occasions radiating to the hand   Her previous treatment includes 7 injections and multiple therapies with no relief   She had risk factors for surgery for rotator cuff repair including smoking 3 cigarettes/day and diabetes   She is already had her MRI and presents for possible surgical treatment   System review she has had 2 total knees she has had multiple foot surgeries she has no chest pain shortness of breath numbness or tingling       Past Medical History:  Diagnosis Date  . Asthma    . Diabetes (HCC)    . GERD (gastroesophageal reflux disease)    . High blood pressure    . Tobacco use           Past Surgical History:  Procedure Laterality Date  . FOOT SURGERY      . JOINT REPLACEMENT             Family History  Problem Relation Age of Onset  . Cancer Father    . Cancer Brother    . Cancer Paternal Aunt    . Diabetes Maternal Grandmother      Social History         Tobacco Use  . Smoking status: Current Every Day Smoker      Packs/day: 0.20      Types: Cigarettes  . Smokeless tobacco: Never Used  Substance Use Topics  . Alcohol use: Not on file  . Drug use: Not on file      BP (!) 146/98   Pulse 84   Ht 5\' 2"  (1.575 m)   Wt 232 lb (105.2 kg)   BMI 42.43 kg/m    The patient meets the AMA guidelines for Morbid (severe) obesity with a BMI > 40.0 and I have recommended weight loss.   General appearance normal development  nutrition hygiene grooming with no deformities endomorphic in body habitus   Peripheral vascular system no swelling no varicose veins in her upper extremities pulses and temperature normal without edema or tenderness.  Lymph nodes cervical region normal   Gait and Station no abnormalities   Inspection of the right shoulder shows full range of motion with no tenderness or swelling shoulder stable with normal strength and muscle tone skin is intact   Left shoulder tenderness over the biceps tendon rotator interval lateral deltoid and infra scapular beneath the spine with her arm at her side she has 50 degrees external rotation I was able to abduct her arm easily to 90 degrees but it was painful flexion was 100 degrees passive before pain and she had 80 degrees of her own flexion with normal extension no instability was noted she had weakness in the supraspinatus and infraspinatus but normal muscle tone no atrophy normal skin   Sensation was intact she was oriented x3 mood and affect were pleasant and normal no pathologic reflexes  or coordination deficits were noted   MRI and imaging   Moderate tendinosis of the supraspinatus tendon with high-grade partial-thickness near complete tear with a few intact fibers retracted about 12 mm.  Tendinosis of the infraspinatus tendon with small interstitial tear at the musculotendinous junction and atrophy of the infraspinatus muscle   The independent interpretation of this MRI is that she has a full-thickness tear 1.2 cm retracted.  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  . Chronic left shoulder pain Yes  . Body mass index 40.0-44.9, adult ([place])    . Morbid obesity ([place])    . Traumatic complete tear of left rotator cuff, initial encounter      The  procedure has been fully reviewed with the patient; The risks and benefits of surgery have been discussed and explained and understood. Alternative treatment has also been reviewed, questions were encouraged and answered. The postoperative plan is also been reviewed.   Patient aware that she is at risk for nonhealing because of her smoking and diabetes history and she is aware that full functional recovery may take a year   Plan open rotator cuff repair left shoulder

## 2019-05-16 ENCOUNTER — Encounter (HOSPITAL_COMMUNITY)
Admission: RE | Disposition: A | Discharge status: Home / Self Care | Payer: Self-pay | Source: Home / Self Care | Attending: Orthopedic Surgery

## 2019-05-16 ENCOUNTER — Ambulatory Visit (HOSPITAL_COMMUNITY): Payer: Medicaid Other | Admitting: Anesthesiology

## 2019-05-16 ENCOUNTER — Ambulatory Visit (HOSPITAL_COMMUNITY)
Admission: RE | Admit: 2019-05-16 | Discharge: 2019-05-16 | Disposition: A | Discharge status: Home / Self Care | Payer: Medicaid Other | Attending: Orthopedic Surgery | Admitting: Orthopedic Surgery

## 2019-05-16 DIAGNOSIS — E119 Type 2 diabetes mellitus without complications: Secondary | ICD-10-CM | POA: Insufficient documentation

## 2019-05-16 DIAGNOSIS — W11XXXA Fall on and from ladder, initial encounter: Secondary | ICD-10-CM | POA: Diagnosis not present

## 2019-05-16 DIAGNOSIS — Z20822 Contact with and (suspected) exposure to covid-19: Secondary | ICD-10-CM | POA: Insufficient documentation

## 2019-05-16 DIAGNOSIS — S46012A Strain of muscle(s) and tendon(s) of the rotator cuff of left shoulder, initial encounter: Secondary | ICD-10-CM | POA: Insufficient documentation

## 2019-05-16 DIAGNOSIS — F17210 Nicotine dependence, cigarettes, uncomplicated: Secondary | ICD-10-CM | POA: Insufficient documentation

## 2019-05-16 DIAGNOSIS — Z6841 Body Mass Index (BMI) 40.0 and over, adult: Secondary | ICD-10-CM | POA: Diagnosis not present

## 2019-05-16 DIAGNOSIS — Z794 Long term (current) use of insulin: Secondary | ICD-10-CM | POA: Diagnosis not present

## 2019-05-16 DIAGNOSIS — I10 Essential (primary) hypertension: Secondary | ICD-10-CM | POA: Diagnosis not present

## 2019-05-16 HISTORY — PX: SHOULDER OPEN ROTATOR CUFF REPAIR: SHX2407

## 2019-05-16 LAB — SARS CORONAVIRUS 2 BY RT PCR (HOSPITAL ORDER, PERFORMED IN ~~LOC~~ HOSPITAL LAB): SARS Coronavirus 2: NEGATIVE

## 2019-05-16 SURGERY — REPAIR, ROTATOR CUFF, OPEN
Anesthesia: General | Site: Shoulder | Laterality: Left

## 2019-05-16 MED ORDER — SUCCINYLCHOLINE CHLORIDE 200 MG/10ML IV SOSY
PREFILLED_SYRINGE | INTRAVENOUS | Status: AC
  Filled 2019-05-16: qty 10

## 2019-05-16 MED ORDER — MEPERIDINE HCL 50 MG/ML IJ SOLN: 6.2500 mg | INTRAMUSCULAR | Status: DC | PRN

## 2019-05-16 MED ORDER — EPHEDRINE SULFATE 50 MG/ML IJ SOLN
INTRAMUSCULAR | Status: DC | PRN
  Administered 2019-05-16: 10 mg via INTRAVENOUS
  Administered 2019-05-16: 5 mg via INTRAVENOUS

## 2019-05-16 MED ORDER — 0.9 % SODIUM CHLORIDE (POUR BTL) OPTIME
TOPICAL | Status: DC | PRN
  Administered 2019-05-16: 1000 mL

## 2019-05-16 MED ORDER — HYDROMORPHONE HCL 1 MG/ML IJ SOLN
0.2500 mg | INTRAMUSCULAR | Status: DC | PRN
  Administered 2019-05-16: 0.5 mg via INTRAVENOUS
  Filled 2019-05-16: qty 0.5

## 2019-05-16 MED ORDER — SUGAMMADEX SODIUM 200 MG/2ML IV SOLN
INTRAVENOUS | Status: DC | PRN
Start: 2019-05-16 — End: 2019-05-16
  Administered 2019-05-16: 200 mg via INTRAVENOUS

## 2019-05-16 MED ORDER — VANCOMYCIN HCL 1500 MG/300ML IV SOLN
INTRAVENOUS | Status: AC
  Filled 2019-05-16: qty 300

## 2019-05-16 MED ORDER — GLYCOPYRROLATE PF 0.2 MG/ML IJ SOSY
PREFILLED_SYRINGE | INTRAMUSCULAR | Status: AC
  Filled 2019-05-16: qty 1

## 2019-05-16 MED ORDER — EPHEDRINE 5 MG/ML INJ
INTRAVENOUS | Status: AC
  Filled 2019-05-16: qty 10

## 2019-05-16 MED ORDER — LACTATED RINGERS IV SOLN: Freq: Once | INTRAVENOUS | Status: AC

## 2019-05-16 MED ORDER — LIDOCAINE HCL URETHRAL/MUCOSAL 2 % EX GEL
CUTANEOUS | Status: DC | PRN
  Administered 2019-05-16: 60 via TOPICAL

## 2019-05-16 MED ORDER — FENTANYL CITRATE (PF) 100 MCG/2ML IJ SOLN
INTRAMUSCULAR | Status: AC
  Filled 2019-05-16: qty 2

## 2019-05-16 MED ORDER — HYDROCODONE-ACETAMINOPHEN 10-325 MG PO TABS: 1.0000 | ORAL_TABLET | ORAL | 0 refills | Status: AC | PRN

## 2019-05-16 MED ORDER — DEXAMETHASONE SODIUM PHOSPHATE 4 MG/ML IJ SOLN
INTRAMUSCULAR | Status: AC
  Filled 2019-05-16: qty 2

## 2019-05-16 MED ORDER — PHENYLEPHRINE 40 MCG/ML (10ML) SYRINGE FOR IV PUSH (FOR BLOOD PRESSURE SUPPORT)
PREFILLED_SYRINGE | INTRAVENOUS | Status: AC
  Filled 2019-05-16: qty 30

## 2019-05-16 MED ORDER — MIDAZOLAM HCL 2 MG/2ML IJ SOLN
INTRAMUSCULAR | Status: AC
  Filled 2019-05-16: qty 2

## 2019-05-16 MED ORDER — LIDOCAINE HCL (PF) 1 % IJ SOLN
INTRAMUSCULAR | Status: DC | PRN
Start: 2019-05-16 — End: 2019-05-16
  Administered 2019-05-16: 3 mL

## 2019-05-16 MED ORDER — PHENYLEPHRINE HCL (PRESSORS) 10 MG/ML IV SOLN
INTRAVENOUS | Status: DC | PRN
  Administered 2019-05-16: 120 ug via INTRAVENOUS
  Administered 2019-05-16: 80 ug via INTRAVENOUS
  Administered 2019-05-16 (×3): 100 ug via INTRAVENOUS
  Administered 2019-05-16: 80 ug via INTRAVENOUS
  Administered 2019-05-16: 120 ug via INTRAVENOUS

## 2019-05-16 MED ORDER — ROCURONIUM BROMIDE 10 MG/ML (PF) SYRINGE
PREFILLED_SYRINGE | INTRAVENOUS | Status: AC
  Filled 2019-05-16: qty 10

## 2019-05-16 MED ORDER — LIDOCAINE 2% (20 MG/ML) 5 ML SYRINGE
INTRAMUSCULAR | Status: AC
  Filled 2019-05-16: qty 5

## 2019-05-16 MED ORDER — HYDROCODONE-ACETAMINOPHEN 7.5-325 MG PO TABS
1.0000 | ORAL_TABLET | Freq: Once | ORAL | Status: AC
  Administered 2019-05-16: 1 via ORAL
  Filled 2019-05-16: qty 1

## 2019-05-16 MED ORDER — SUCCINYLCHOLINE CHLORIDE 20 MG/ML IJ SOLN
INTRAMUSCULAR | Status: DC | PRN
  Administered 2019-05-16: 120 mg via INTRAVENOUS

## 2019-05-16 MED ORDER — TIZANIDINE HCL 4 MG PO TABS
4.0000 mg | ORAL_TABLET | Freq: Three times a day (TID) | ORAL | 1 refills | Status: DC
Start: 2019-05-16 — End: 2019-07-11

## 2019-05-16 MED ORDER — MIDAZOLAM HCL 2 MG/2ML IJ SOLN
2.0000 mg | Freq: Once | INTRAMUSCULAR | Status: AC
  Administered 2019-05-16: 2 mg via INTRAVENOUS

## 2019-05-16 MED ORDER — METHOCARBAMOL 1000 MG/10ML IJ SOLN
500.0000 mg | Freq: Once | INTRAVENOUS | Status: AC
  Administered 2019-05-16: 500 mg via INTRAVENOUS
  Filled 2019-05-16: qty 5

## 2019-05-16 MED ORDER — DEXAMETHASONE SODIUM PHOSPHATE 4 MG/ML IJ SOLN
INTRAMUSCULAR | Status: DC | PRN
  Administered 2019-05-16: 6 mg via INTRAVENOUS

## 2019-05-16 MED ORDER — ONDANSETRON HCL 4 MG/2ML IJ SOLN
INTRAMUSCULAR | Status: DC | PRN
  Administered 2019-05-16: 4 mg via INTRAVENOUS

## 2019-05-16 MED ORDER — PROPOFOL 10 MG/ML IV BOLUS
INTRAVENOUS | Status: AC
  Filled 2019-05-16: qty 20

## 2019-05-16 MED ORDER — ONDANSETRON HCL 4 MG/2ML IJ SOLN
4.0000 mg | Freq: Once | INTRAMUSCULAR | Status: AC | PRN
  Administered 2019-05-16: 4 mg via INTRAVENOUS
  Filled 2019-05-16: qty 2

## 2019-05-16 MED ORDER — ONDANSETRON HCL 4 MG/2ML IJ SOLN
INTRAMUSCULAR | Status: AC
  Filled 2019-05-16: qty 2

## 2019-05-16 MED ORDER — LACTATED RINGERS IV SOLN: INTRAVENOUS | Status: DC | PRN

## 2019-05-16 MED ORDER — ONDANSETRON HCL 4 MG/2ML IJ SOLN: 4.0000 mg | Freq: Four times a day (QID) | INTRAMUSCULAR | Status: DC

## 2019-05-16 MED ORDER — PROPOFOL 10 MG/ML IV BOLUS
INTRAVENOUS | Status: DC | PRN
  Administered 2019-05-16: 100 mg via INTRAVENOUS

## 2019-05-16 MED ORDER — ROPIVACAINE HCL 5 MG/ML IJ SOLN
INTRAMUSCULAR | Status: DC | PRN
Start: 2019-05-16 — End: 2019-05-16
  Administered 2019-05-16: 21 mL via PERINEURAL

## 2019-05-16 MED ORDER — PROMETHAZINE HCL 12.5 MG PO TABS
12.5000 mg | ORAL_TABLET | Freq: Four times a day (QID) | ORAL | 0 refills | Status: DC | PRN
Start: 2019-05-16 — End: 2023-01-19

## 2019-05-16 MED ORDER — GLYCOPYRROLATE 0.2 MG/ML IJ SOLN
INTRAMUSCULAR | Status: DC | PRN
Start: 2019-05-16 — End: 2019-05-16
  Administered 2019-05-16 (×2): .1 mg via INTRAVENOUS

## 2019-05-16 MED ORDER — LIDOCAINE HCL (PF) 1 % IJ SOLN
INTRAMUSCULAR | Status: AC
  Filled 2019-05-16: qty 30

## 2019-05-16 MED ORDER — ROCURONIUM BROMIDE 100 MG/10ML IV SOLN
INTRAVENOUS | Status: DC | PRN
  Administered 2019-05-16: 50 mg via INTRAVENOUS

## 2019-05-16 MED ORDER — BUPIVACAINE-EPINEPHRINE (PF) 0.5% -1:200000 IJ SOLN
INTRAMUSCULAR | Status: DC | PRN
  Administered 2019-05-16: 10 mL via PERINEURAL

## 2019-05-16 MED ORDER — VANCOMYCIN HCL 1500 MG/300ML IV SOLN
1500.0000 mg | INTRAVENOUS | Status: AC
  Administered 2019-05-16: 1500 mg via INTRAVENOUS

## 2019-05-16 MED ORDER — ROPIVACAINE HCL 5 MG/ML IJ SOLN
INTRAMUSCULAR | Status: AC
  Filled 2019-05-16: qty 30

## 2019-05-16 SURGICAL SUPPLY — 53 items
ANCHOR SUT BIO SW 4.75X19.1 (Anchor) ×2 IMPLANT
BENZOIN TINCTURE PRP APPL 2/3 (GAUZE/BANDAGES/DRESSINGS) ×2 IMPLANT
BIT DRILL 1.6MX128 (BIT) ×1 IMPLANT
BLADE HEX COATED 2.75 (ELECTRODE) ×2 IMPLANT
BLADE OSC/SAGITTAL MD 9X18.5 (BLADE) ×1 IMPLANT
BUR FAST CUTTING (BURR) ×1
BUR SRGRND 54.5X3.2X8 (BURR) IMPLANT
BURR SRGRND 54.5X3.2X8 (BURR) ×1
CHLORAPREP W/TINT 26 (MISCELLANEOUS) ×2 IMPLANT
CLOTH BEACON ORANGE TIMEOUT ST (SAFETY) ×2 IMPLANT
CLSR STERI-STRIP ANTIMIC 1/2X4 (GAUZE/BANDAGES/DRESSINGS) ×2 IMPLANT
COVER LIGHT HANDLE STERIS (MISCELLANEOUS) ×4 IMPLANT
COVER WAND RF STERILE (DRAPES) ×2 IMPLANT
DECANTER SPIKE VIAL GLASS SM (MISCELLANEOUS) ×1 IMPLANT
DRAPE ORTHO 2.5IN SPLIT 77X108 (DRAPES) ×2 IMPLANT
DRAPE ORTHO SPLIT 77X108 STRL (DRAPES) ×2
DRESSING MEPILEX BORDER 6X8 (GAUZE/BANDAGES/DRESSINGS) ×1 IMPLANT
DRSG MEPILEX BORDER 6X8 (GAUZE/BANDAGES/DRESSINGS) ×2
ELECT REM PT RETURN 9FT ADLT (ELECTROSURGICAL) ×2
ELECTRODE REM PT RTRN 9FT ADLT (ELECTROSURGICAL) ×1 IMPLANT
GLOVE BIO SURGEON STRL SZ7 (GLOVE) ×1 IMPLANT
GLOVE BIOGEL PI IND STRL 7.0 (GLOVE) ×3 IMPLANT
GLOVE BIOGEL PI INDICATOR 7.0 (GLOVE) ×3
GLOVE ECLIPSE 6.5 STRL STRAW (GLOVE) ×1 IMPLANT
GLOVE SKINSENSE NS SZ8.0 LF (GLOVE) ×1
GLOVE SKINSENSE STRL SZ8.0 LF (GLOVE) ×1 IMPLANT
GLOVE SS N UNI LF 8.5 STRL (GLOVE) ×2 IMPLANT
GOWN STRL REUS W/TWL LRG LVL3 (GOWN DISPOSABLE) ×4 IMPLANT
GOWN STRL REUS W/TWL XL LVL3 (GOWN DISPOSABLE) ×2 IMPLANT
INST SET MINOR BONE (KITS) ×2 IMPLANT
KIT BLADEGUARD II DBL (SET/KITS/TRAYS/PACK) ×2 IMPLANT
KIT TURNOVER KIT A (KITS) ×2 IMPLANT
MANIFOLD NEPTUNE II (INSTRUMENTS) ×2 IMPLANT
MARKER SKIN DUAL TIP RULER LAB (MISCELLANEOUS) ×2 IMPLANT
NDL HYPO 21X1.5 SAFETY (NEEDLE) ×1 IMPLANT
NDL MA TROC 1/2 (NEEDLE) IMPLANT
NDL SCORPION MULTI FIRE (NEEDLE) IMPLANT
NEEDLE HYPO 21X1.5 SAFETY (NEEDLE) ×2 IMPLANT
NEEDLE MA TROC 1/2 (NEEDLE) ×2 IMPLANT
NEEDLE SCORPION MULTI FIRE (NEEDLE) ×2 IMPLANT
NS IRRIG 1000ML POUR BTL (IV SOLUTION) ×2 IMPLANT
PACK TOTAL JOINT (CUSTOM PROCEDURE TRAY) ×1 IMPLANT
PAD ARMBOARD 7.5X6 YLW CONV (MISCELLANEOUS) ×2 IMPLANT
RASP SM TEAR CROSS CUT (RASP) ×1 IMPLANT
SET BASIN LINEN APH (SET/KITS/TRAYS/PACK) ×2 IMPLANT
SLING ARM IMMOBILIZER LRG (SOFTGOODS) ×1 IMPLANT
STRIP CLOSURE SKIN 1/2X4 (GAUZE/BANDAGES/DRESSINGS) ×1 IMPLANT
SUT ETHIBOND NAB OS 4 #2 30IN (SUTURE) ×2 IMPLANT
SUT MON AB 0 CT1 (SUTURE) ×2 IMPLANT
SUT MON AB 2-0 CT1 36 (SUTURE) ×2 IMPLANT
SUT TIGER TAPE 7 IN WHITE (SUTURE) ×1 IMPLANT
SYR BULB IRRIG 60ML STRL (SYRINGE) ×2 IMPLANT
TAPE FIBER 2MM 7IN #2 BLUE (SUTURE) ×1 IMPLANT

## 2019-05-16 NOTE — Discharge Instructions (Signed)
Interscalene Nerve Block, Care After This sheet gives you information about how to care for yourself after your procedure. Your health care provider may also give you more specific instructions. If you have problems or questions, contact your health care provider. What can I expect after the procedure? After the procedure, it is common to have:  Soreness or tenderness in your neck.  Numbness in your shoulder, upper arm, and some fingers.  Weakness in your shoulder and arm muscles. The feeling and strength in your shoulder, arm, and fingers should return to normal within hours after your procedure. Follow these instructions at home: For at least 24 hours after the procedure:  Do not: ? Participate in activities in which you could fall or become injured. ? Drive. ? Use heavy machinery. ? Drink alcohol. ? Take sleeping pills or medicines that cause drowsiness. ? Make important decisions or sign legal documents. ? Take care of children on your own.  Rest. Eating and drinking  If you vomit, drink water, juice, or soup when you can drink without vomiting.  Make sure you have little or no nausea before eating solid foods.  Follow the diet that is recommended by your health care provider. If you have a sling:  Wear it as told by your health care provider. Remove it only as told by your health care provider.  Loosen the sling if your fingers tingle, become numb, or turn cold and blue.  Make sure that your entire arm, including your wrist, is supported. Do not allow your wrist to dangle over the end of the sling.  Do not let your sling get wet if it is not waterproof.  Keep the sling clean. Bathing  Do not take baths, swim, or use a hot tub until your health care provider approves.  If you have a nerve block catheter in place, keep the incision site and tubing dry. Injection site care   Wash your hands with soap and water before you change your bandage (dressing). If soap and  water are not available, use hand sanitizer.  Change your dressing as told by your health care provider.  Keep your dressing dry.  Check your nerve block injection site every day for signs of infection. Check for: ? Redness, swelling, or pain. ? Fluid or blood. ? Warmth. Activity  Do not perform complex or risky activities while taking prescription pain medicine and until you have fully recovered.  Return to your normal activities as told by your health care provider and as you can tolerate them. Ask your health care provider what activities are safe for you.  Rest and take it easy. This will help you heal and recover more quickly and fully.  Be very cautious until you have regained strength and sensation. General instructions  Have a responsible adult stay with you until you are awake and alert.  Do not drive or use heavy machinery while taking prescription pain medicine and until you have fully recovered. Ask your health care provider when it is safe to drive.  Take over-the-counter and prescription medicines only as told by your health care provider.  If you smoke, do not smoke without supervision.  Do not expose your arm or shoulder to very cold or very hot temperatures until you have full feeling back.  If you have a nerve block catheter in place: ? Try to keep the catheter from getting kinked or pinched. ? Avoid pulling or tugging on the catheter.  Keep all follow-up visits as told  by your health care provider. This is important. Contact a health care provider if:  You have chills or fever.  You have redness, swelling, or pain around your injection site.  You have fluid or blood coming from the injection site.  The skin around the injection site is warm to the touch.  There is a bad smell coming from your dressing.  You have hoarseness or a drooping or dry eye that lasts more than a few days.  You have pain that is poorly controlled with the block or with pain  medicine.  You have numbness, tingling, or weakness in your shoulder or arm that lasts for more than one week. Get help right away if:  You have severe pain.  You lose or do not regain strength and sensation in your arm even after the nerve block medicine has stopped.  You have trouble breathing.  You have a nerve block catheter still in place and you begin to shiver.  You have a nerve block catheter still in place and you are getting more and more numb or weak. This information is not intended to replace advice given to you by your health care provider. Make sure you discuss any questions you have with your health care provider. Document Revised: 12/23/2016 Document Reviewed: 08/21/2015 Elsevier Patient Education  2020 [employer] [facility], Adult, Care After This sheet gives you information about how to care for yourself after your procedure. Your health care provider may also give you more specific instructions. If you have problems or questions, contact your health care provider. What can I expect after the procedure? After the procedure, the following side effects are common:  Pain or discomfort at the IV site.  Nausea.  Vomiting.  Sore throat.  Trouble concentrating.  Feeling cold or chills.  Weak or tired.  Sleepiness and fatigue.  Soreness and body aches. These side effects can affect parts of the body that were not involved in surgery. Follow these instructions at home:  For at least 24 hours after the procedure:  Have a responsible adult stay with you. It is important to have someone help care for you until you are awake and alert.  Rest as needed.  Do not: ? Participate in activities in which you could fall or become injured. ? Drive. ? Use heavy machinery. ? Drink alcohol. ? Take sleeping pills or medicines that cause drowsiness. ? Make important decisions or sign legal documents. ? Take care of children on your own. Eating and  drinking  Follow any instructions from your health care provider about eating or drinking restrictions.  When you feel hungry, start by eating small amounts of foods that are soft and easy to digest (bland), such as toast. Gradually return to your regular diet.  Drink enough fluid to keep your urine pale yellow.  If you vomit, rehydrate by drinking water, juice, or clear broth. General instructions  If you have sleep apnea, surgery and certain medicines can increase your risk for breathing problems. Follow instructions from your health care provider about wearing your sleep device: ? Anytime you are sleeping, including during daytime naps. ? While taking prescription pain medicines, sleeping medicines, or medicines that make you drowsy.  Return to your normal activities as told by your health care provider. Ask your health care provider what activities are safe for you.  Take over-the-counter and prescription medicines only as told by your health care provider.  If you smoke, do not  smoke without supervision.  Keep all follow-up visits as told by your health care provider. This is important. Contact a health care provider if:  You have nausea or vomiting that does not get better with medicine.  You cannot eat or drink without vomiting.  You have pain that does not get better with medicine.  You are unable to pass urine.  You develop a skin rash.  You have a fever.  You have redness around your IV site that gets worse. Get help right away if:  You have difficulty breathing.  You have chest pain.  You have blood in your urine or stool, or you vomit blood. Summary  After the procedure, it is common to have a sore throat or nausea. It is also common to feel tired.  Have a responsible adult stay with you for the first 24 hours after general anesthesia. It is important to have someone help care for you until you are awake and alert.  When you feel hungry, start by eating  small amounts of foods that are soft and easy to digest (bland), such as toast. Gradually return to your regular diet.  Drink enough fluid to keep your urine pale yellow.  Return to your normal activities as told by your health care provider. Ask your health care provider what activities are safe for you. This information is not intended to replace advice given to you by your health care provider. Make sure you discuss any questions you have with your health care provider. Document Revised: 12/23/2016 Document Reviewed: 08/05/2016 Elsevier Patient Education  2020 [employer].

## 2019-05-16 NOTE — Anesthesia Postprocedure Evaluation (Signed)
Anesthesia Post Note  Patient: Alyssa Patrick  Procedure(s) Performed: ROTATOR CUFF REPAIR SHOULDER OPEN (Left Shoulder)  Patient location during evaluation: PACU Anesthesia Type: General Level of consciousness: awake and alert and oriented Pain management: pain level controlled Vital Signs Assessment: post-procedure vital signs reviewed and stable Respiratory status: spontaneous breathing Cardiovascular status: blood pressure returned to baseline and stable Postop Assessment: no apparent nausea or vomiting Anesthetic complications: no     Last Vitals:  Vitals:   05/16/19 1435 05/16/19 1447  BP: 124/84   Pulse: 74   Resp: 18   Temp: (!) 36.1 C   SpO2: 92% 94%    Last Pain:  Vitals:   05/16/19 1435  TempSrc: Oral  PainSc:                  Anchor Dwan

## 2019-05-16 NOTE — Op Note (Signed)
05/16/2019  11:41 AM  PATIENT:  Alyssa Patrick  59 y.o. female  PRE-OPERATIVE DIAGNOSIS:  left rotator cuff tear  POST-OPERATIVE DIAGNOSIS:  left rotator cuff tear  PROCEDURE:  Procedure(s) with comments: ROTATOR CUFF REPAIR SHOULDER OPEN (Left) - with scalene block  - 23410  Findings: partial thickness (75%) tear rotator cuff supraspinatus  Implants:  arthrex speed fix with 2 fiber tapes and 2 swivel locks   SURGEON:  Surgeon(s) and Role:    Vickki Hearing, MD - Primary  PHYSICIAN ASSISTANT:   ASSISTANTS: Trenton Founds  ANESTHESIA:   GA General anesthesia with scalene block EBL:  50 mL   BLOOD ADMINISTERED:none  DRAINS: none   LOCAL MEDICATIONS USED:  MARCAINE     SPECIMEN:  No Specimen  DISPOSITION OF SPECIMEN:  N/A  COUNTS:  YES  TOURNIQUET:  * No tourniquets in log *  DICTATION: .Dragon Dictation  PLAN OF CARE: Discharge to home after PACU  PATIENT DISPOSITION:  PACU - hemodynamically stable.   Delay start of Pharmacological VTE agent (>24hrs) due to surgical blood loss or risk of bleeding: not applicable  Del Wiseman was seen in preop after scalene block was performed.  Site was confirmed and marked surgeon's initials.  Chart review was completed images were studied and implants were checked and were available for surgery  The patient was taken to the operating room for general anesthesia.  She was in the supine position.  A 5 pound sandbag was placed on the left scapula and she was placed in a modified beachchair position  Under anesthesia the arm was taken through a range of motion there was no adhesions or restrictions in motion  She was then prepped and draped sterilely timeout was completed  Subcutaneous tissue was injected with Marcaine with epinephrine.  The incision was made over the anterolateral edge of the acromion in Langer's lines.  The subcutaneous tissue was divided down to the deltoid fascia which was then split in line with the  incision up to the acromion.  The periosteum was dissected anteriorly and posteriorly and continuing the deltoid split down to the underlying bursa.  Thickened bursa was encountered and was excised.  The tear was found in the supraspinatus tendon with no retraction.  It was a partial thickness tear of approximately 75% of the depth of the supraspinatus with the superior layer retracted approximately 1 cm.  The diseased tissue was excised with a sharp knife.  Good bleeding tissue was noted to be remaining.  The greater tuberosity was treated with a bur and for 1.6 mm drill holes were placed.  2 suture tapes were placed in the rotator cuff.  The cuff had good excursion.  The undersurface was debrided.  2 swivel lock anchors were placed in the humerus which reapproximated the remaining tendon back to the greater tuberosity with excellent footprint and compression  The wound was then irrigated the deltoid split was repaired with nonabsorbable suture in interrupted fashion.  The subcutaneous tissue was closed with 0 Monocryl in the deep subcutaneous tissue followed by 2-0 Monocryl in a running subcuticular stitch.  Sterile dressing was applied as well as a sling  She was then extubated taken to recovery room in stable condition

## 2019-05-16 NOTE — Interval H&P Note (Signed)
History and Physical Interval Note:  05/16/2019 9:43 AM  Alyssa Patrick  has presented today for surgery, with the diagnosis of left rotator cuff tear.  The various methods of treatment have been discussed with the patient and family. After consideration of risks, benefits and other options for treatment, the patient has consented to  Procedure(s) with comments: ROTATOR CUFF REPAIR SHOULDER OPEN (Left) - with scalene block  as a surgical intervention.  The patient's history has been reviewed, patient examined, no change in status, stable for surgery.  I have reviewed the patient's chart and labs.  Questions were answered to the patient's satisfaction.     Fuller Canada

## 2019-05-16 NOTE — Anesthesia Preprocedure Evaluation (Signed)
Anesthesia Evaluation  Patient identified by MRN, date of birth, ID band Patient awake    Reviewed: Allergy & Precautions, NPO status , Patient's Chart, lab work & pertinent test results  Airway Mallampati: III  TM Distance: >3 FB Neck ROM: Full    Dental  (+) Upper Dentures, Dental Advisory Given   Pulmonary asthma , sleep apnea , Current Smoker and Patient abstained from smoking.,    Pulmonary exam normal breath sounds clear to auscultation       Cardiovascular hypertension, Pt. on medications  Rhythm:Regular Rate:Normal     Neuro/Psych negative neurological ROS  negative psych ROS   GI/Hepatic GERD  ,  Endo/Other  diabetes, Well Controlled, Type 2, Insulin Dependent  Renal/GU      Musculoskeletal   Abdominal   Peds  Hematology   Anesthesia Other Findings   Reproductive/Obstetrics                           Anesthesia Physical Anesthesia Plan  ASA: III  Anesthesia Plan: General   Post-op Pain Management:  Regional for Post-op pain   Induction: Intravenous  PONV Risk Score and Plan: 4 or greater and Midazolam, Ondansetron and Dexamethasone  Airway Management Planned: Oral ETT  Additional Equipment:   Intra-op Plan:   Post-operative Plan: Extubation in OR  Informed Consent: I have reviewed the patients History and Physical, chart, labs and discussed the procedure including the risks, benefits and alternatives for the proposed anesthesia with the patient or authorized representative who has indicated his/her understanding and acceptance.     Dental advisory given  Plan Discussed with: CRNA and Surgeon  Anesthesia Plan Comments:         Anesthesia Quick Evaluation

## 2019-05-16 NOTE — Anesthesia Procedure Notes (Signed)
Procedure Name: Intubation Date/Time: 05/16/2019 10:33 AM Performed by: Jonna Munro, CRNA Pre-anesthesia Checklist: Patient identified, Emergency Drugs available, Suction available, Patient being monitored and Timeout performed Patient Re-evaluated:Patient Re-evaluated prior to induction Oxygen Delivery Method: Circle system utilized Preoxygenation: Pre-oxygenation with 100% oxygen Induction Type: IV induction Laryngoscope Size: Mac and 3 Grade View: Grade I Tube type: Oral Tube size: 7.0 mm Number of attempts: 1 Airway Equipment and Method: Stylet Placement Confirmation: ETT inserted through vocal cords under direct vision,  positive ETCO2 and breath sounds checked- equal and bilateral Secured at: 22 cm Tube secured with: Tape

## 2019-05-16 NOTE — Anesthesia Procedure Notes (Addendum)
Anesthesia Regional Block: Interscalene brachial plexus block   Pre-Anesthetic Checklist: ,, timeout performed, Correct Patient, Correct Site, Correct Laterality, Correct Procedure, Correct Position, site marked, Risks and benefits discussed,  Surgical consent,  Pre-op evaluation,  At surgeon's request and post-op pain management  Laterality: Upper and Left  Prep: chloraprep       Needles:  Injection technique: Single-shot  Needle Type: Echogenic Stimulator Needle     Needle Length: 5cm  Needle Gauge: 22     Additional Needles:   Procedures:, nerve stimulator,,, ultrasound used (permanent image in chart),,,,  Narrative:  Start time: 05/16/2019 10:08 AM End time: 05/16/2019 10:18 AM  Performed by: Personally  Anesthesiologist: Molli Barrows, MD  Additional Notes: Ropivacaine 0.5% 21 ml with 4 ml dexamethasone at interscalene block, and 7 ml of ropivacaine with 2 mg of dexamethasone at superficial cervical plexus block.   Block assessed prior to start of surgery

## 2019-05-16 NOTE — Brief Op Note (Signed)
05/16/2019  11:41 AM  PATIENT:  Alyssa Patrick  59 y.o. female  PRE-OPERATIVE DIAGNOSIS:  left rotator cuff tear  POST-OPERATIVE DIAGNOSIS:  left rotator cuff tear  PROCEDURE:  Procedure(s) with comments: ROTATOR CUFF REPAIR SHOULDER OPEN (Left) - with scalene block    Findings: partial thickness (75%) tear rotator cuff supraspinatus  Implants:  arthrex speed fix with 2 fiber tapes and 2 swivel locks   SURGEON:  Surgeon(s) and Role:    Vickki Hearing, MD - Primary  PHYSICIAN ASSISTANT:   ASSISTANTS: Trenton Founds  ANESTHESIA:   GA General anesthesia with scalene block EBL:  50 mL   BLOOD ADMINISTERED:none  DRAINS: none   LOCAL MEDICATIONS USED:  MARCAINE     SPECIMEN:  No Specimen  DISPOSITION OF SPECIMEN:  N/A  COUNTS:  YES  TOURNIQUET:  * No tourniquets in log *  DICTATION: .Dragon Dictation  PLAN OF CARE: Discharge to home after PACU  PATIENT DISPOSITION:  PACU - hemodynamically stable.   Delay start of Pharmacological VTE agent (>24hrs) due to surgical blood loss or risk of bleeding: not applicable  Alyssa Patrick was seen in preop after scalene block was performed.  Site was confirmed and marked surgeon's initials.  Chart review was completed images were studied and implants were checked and were available for surgery  The patient was taken to the operating room for general anesthesia.  She was in the supine position.  A 5 pound sandbag was placed on the left scapula and she was placed in a modified beachchair position  Under anesthesia the arm was taken through a range of motion there was no adhesions or restrictions in motion  She was then prepped and draped sterilely timeout was completed  Subcutaneous tissue was injected with Marcaine with epinephrine.  The incision was made over the anterolateral edge of the acromion in Langer's lines.  The subcutaneous tissue was divided down to the deltoid fascia which was then split in line with the incision  up to the acromion.  The periosteum was dissected anteriorly and posteriorly and continuing the deltoid split down to the underlying bursa.  Thickened bursa was encountered and was excised.  The tear was found in the supraspinatus tendon with no retraction.  It was a partial thickness tear of approximately 75% of the depth of the supraspinatus with the superior layer retracted approximately 1 cm.  The diseased tissue was excised with a sharp knife.  Good bleeding tissue was noted to be remaining.  The greater tuberosity was treated with a bur and for 1.6 mm drill holes were placed.  2 suture tapes were placed in the rotator cuff.  The cuff had good excursion.  The undersurface was debrided.  2 swivel lock anchors were placed in the humerus which reapproximated the remaining tendon back to the greater tuberosity with excellent footprint and compression  The wound was then irrigated the deltoid split was repaired with nonabsorbable suture in interrupted fashion.  The subcutaneous tissue was closed with 0 Monocryl in the deep subcutaneous tissue followed by 2-0 Monocryl in a running subcuticular stitch.  Sterile dressing was applied as well as a sling  She was then extubated taken to recovery room in stable condition

## 2019-05-16 NOTE — Transfer of Care (Signed)
Immediate Anesthesia Transfer of Care Note  Patient: Alyssa Patrick  Procedure(s) Performed: ROTATOR CUFF REPAIR SHOULDER OPEN (Left Shoulder)  Patient Location: PACU  Anesthesia Type:General  Level of Consciousness: awake, alert , oriented and patient cooperative  Airway & Oxygen Therapy: Patient Spontanous Breathing and Patient connected to face mask oxygen  Post-op Assessment: Report given to RN, Post -op Vital signs reviewed and stable and Patient moving all extremities X 4  Post vital signs: Reviewed and stable  Last Vitals:  Vitals Value Taken Time  BP 124/86 05/16/19 1147  Temp    Pulse 85 05/16/19 1148  Resp 14 05/16/19 1148  SpO2 97 % 05/16/19 1148  Vitals shown include unvalidated device data.  Last Pain:  Vitals:   05/16/19 0930  TempSrc: Oral  PainSc: 6       Patients Stated Pain Goal: 4 (05/16/19 0930)  Complications: No apparent anesthesia complications

## 2019-05-17 ENCOUNTER — Telehealth: Payer: Self-pay | Admitting: Orthopedic Surgery

## 2019-05-17 NOTE — Telephone Encounter (Signed)
Patient called, states having pain, and that Hydrocodone does not seem to be helping; she is status/post shoulder surgery yesterday, 05/16/19. States uses Administrator, sports, [place]. Please call her #[phone]

## 2019-05-24 ENCOUNTER — Ambulatory Visit (INDEPENDENT_AMBULATORY_CARE_PROVIDER_SITE_OTHER): Payer: Medicaid Other | Admitting: Orthopedic Surgery

## 2019-05-24 ENCOUNTER — Other Ambulatory Visit: Payer: Self-pay

## 2019-05-24 ENCOUNTER — Encounter: Payer: Self-pay | Admitting: Orthopedic Surgery

## 2019-05-24 ENCOUNTER — Telehealth: Payer: Self-pay | Admitting: Radiology

## 2019-05-24 DIAGNOSIS — Z9889 Other specified postprocedural states: Secondary | ICD-10-CM

## 2019-05-24 MED ORDER — TRAMADOL HCL 50 MG PO TABS: 50.0000 mg | ORAL_TABLET | Freq: Four times a day (QID) | ORAL | 5 refills | Status: AC | PRN

## 2019-05-24 NOTE — Progress Notes (Signed)
Chief Complaint  Patient presents with  . Routine Post Op    05/16/19 left shoulder arthroscopy    norco causing itching   Postop day #8 patient would like to change to a different type of sling she also had some itching caused by the hydrocodone which did not respond well to the Benadryl  She wants to switch over to tramadol  She can start physical therapy  She will follow-up in 4 - 6 weeks   Meds ordered this encounter  Medications  . traMADol (ULTRAM) 50 MG tablet    Sig: Take 1 tablet (50 mg total) by mouth every 6 (six) hours as needed for up to 7 days.    Dispense:  28 tablet    Refill:  5

## 2019-05-24 NOTE — Addendum Note (Signed)
Addended byCaffie Damme on: 05/24/2019 11:16 AM   Modules accepted: Orders

## 2019-05-24 NOTE — Patient Instructions (Signed)
Start Therapy   Wear sling except for 3 x a day to straighten the arm

## 2019-05-24 NOTE — Addendum Note (Signed)
Addended byCaffie Damme on: 05/24/2019 10:56 AM   Modules accepted: Orders

## 2019-05-24 NOTE — Telephone Encounter (Signed)
-----   Message from Doristine Section sent at 05/24/2019 11:10 AM EDT ----- Regarding: Patient came back into office, question Does Alyssa Patrick [phone] need to have a bandage placed back on; said had the steri-strips on. Also asking if she may take a shower yet?

## 2019-05-24 NOTE — Telephone Encounter (Signed)
I spoke to her. Yes yest.

## 2019-05-28 ENCOUNTER — Encounter (HOSPITAL_COMMUNITY): Payer: Self-pay | Admitting: Occupational Therapy

## 2019-05-28 ENCOUNTER — Ambulatory Visit (HOSPITAL_COMMUNITY): Payer: Medicaid Other | Attending: Orthopedic Surgery | Admitting: Occupational Therapy

## 2019-05-28 ENCOUNTER — Other Ambulatory Visit: Payer: Self-pay

## 2019-05-28 DIAGNOSIS — R29898 Other symptoms and signs involving the musculoskeletal system: Secondary | ICD-10-CM | POA: Diagnosis present

## 2019-05-28 DIAGNOSIS — M25512 Pain in left shoulder: Secondary | ICD-10-CM | POA: Diagnosis present

## 2019-05-28 NOTE — Patient Instructions (Signed)
1) SHOULDER: Flexion On Table   Place hands on towel placed on table, elbows straight. Lean forward with you upper body, pushing towel away from body.  _15__ reps per set, _3__ sets per day  2) Abduction (Passive)   With arm out to side, resting on towel placed on table with palm DOWN, keeping trunk away from table, lean to the side while pushing towel away from body.  Repeat __15__ times. Do __3__ sessions per day.  Copyright  VHI. All rights reserved.     3) Internal Rotation (Assistive)   Seated with elbow bent at right angle and held against side, slide arm on table surface in an inward arc keeping elbow anchored in place. Repeat _15___ times. Do __3__ sessions per day. Activity: Use this motion to brush crumbs off the table.  Copyright  VHI. All rights reserved.    

## 2019-05-28 NOTE — Therapy (Signed)
[facility] [address] [place], [place], [address] Phone: [phone]   Fax:  [phone]  Occupational Therapy Evaluation  Patient Details  Name: Alyssa Patrick MRN: [id number] Date of Birth: 11-24-1960 Referring Provider (OT): Dr. Fuller Canada   Encounter Date: 05/28/2019  OT End of Session - 05/28/19 1451    Visit Number  1    Number of Visits  16    Date for OT Re-Evaluation  07/27/19   mini-reassessment 06/26/19   Authorization Type  Medicaid    Authorization Time Period  Requesting initial 3 visits    Authorization - Visit Number  0    Authorization - Number of Visits  3    OT Start Time  1346    OT Stop Time  1420    OT Time Calculation (min)  34 min    Activity Tolerance  Patient tolerated treatment well    Behavior During Therapy  [facility] for tasks assessed/performed       Past Medical History:  Diagnosis Date  . Asthma   . Diabetes (HCC)   . GERD (gastroesophageal reflux disease)   . High blood pressure   . Sleep apnea   . Tobacco use     Past Surgical History:  Procedure Laterality Date  . ENDOVENOUS ABLATION SAPHENOUS VEIN W/ LASER    . FOOT SURGERY    . JOINT REPLACEMENT    . SHOULDER OPEN ROTATOR CUFF REPAIR Left 05/16/2019   Procedure: ROTATOR CUFF REPAIR SHOULDER OPEN;  Surgeon: Vickki Hearing, MD;  Location: [facility];  Service: Orthopedics;  Laterality: Left;  with scalene block     There were no vitals filed for this visit.  Subjective Assessment - 05/28/19 1447    Subjective   S: I bought this sling off of [employer] because the other one was hurting my neck.    Pertinent History  Pt is a 59 y/o female presenting s/p right open RCR on 05/16/19. Pt reports pain and RC injury approximately 4 years ago, had multiple cortisone injections while living in [place] and then pursued sx once moving back to [place]. Pt was referred to occupational therapy for evaluation and treatment by Dr. Fuller Canada.    Patient Stated Goals  To improve ability to use LUE without pain.    Currently in Pain?  No/denies        [facility] OT Assessment - 05/28/19 1346      Assessment   Medical Diagnosis  s/p left open RCR    Referring Provider (OT)  Dr. Fuller Canada    Onset Date/Surgical Date  05/16/19    Hand Dominance  Right    Next MD Visit  06/25/19    Prior Therapy  None      Precautions   Precautions  Shoulder    Type of Shoulder Precautions  Weeks 1-4 (5/13-6/10) P/ROM, pendulums, scapular A/ROM; Weeks 4-12 (6/11-8/5). Week 12: 8/6+ strengthening.     Shoulder Interventions  Shoulder sling/immobilizer;Off for dressing/bathing/exercises      Restrictions   Weight Bearing Restrictions  No      Balance Screen   Has the patient fallen in the past 6 months  No    Has the patient had a decrease in activity level because of a fear of falling?   No    Is the patient reluctant to leave their home because of a fear of falling?   No      Prior Function   Level  of Independence  Independent    Vocation  On disability    Leisure  television, cooking      ADL   ADL comments  Pt is having difficulty with dressing, bathing, reaching overhead and behind back. Pt is unable to use LUE for washing/fixing hair.       Written Expression   Dominant Hand  Right      Cognition   Overall Cognitive Status  Within Functional Limits for tasks assessed      ROM / Strength   AROM / PROM / Strength  AROM;PROM;Strength      Palpation   Palpation comment  moderate fascial restrictions along left upper arm, anterior shoulder, trapezius, and scapular regions      AROM   Overall AROM   Deficits;Unable to assess;Due to precautions      PROM   Overall PROM Comments  Assessed supine, er/IR adducted    PROM Assessment Site  Shoulder    Right/Left Shoulder  Left    Left Shoulder Flexion  131 Degrees    Left Shoulder ABduction  112 Degrees    Left Shoulder Internal Rotation  90 Degrees    Left Shoulder External  Rotation  63 Degrees      Strength   Overall Strength  Deficits;Unable to assess;Due to precautions                        OT Short Term Goals - 05/28/19 1536      OT SHORT TERM GOAL #1   Title  Pt will be provided with and educated on HEP to improve LUE use during ADLs.    Time  4    Period  Weeks    Status  New    Target Date  06/27/19      OT SHORT TERM GOAL #2   Title  Pt will increase LUE P/ROM to WNL to improve ability to donn shirts with minimal compensatory techniques.    Time  4    Period  Weeks    Status  New      OT SHORT TERM GOAL #3   Title  Pt will increase LUE strength to 3+/5 to improve ability to reach items at waist to chest height during daily tasks.    Time  4    Period  Weeks    Status  New        OT Long Term Goals - 05/28/19 1537      OT LONG TERM GOAL #1   Title  Pt will decrease LUE pain to 3/10 or less to improve ability to sleep in preferred position without waking up due to pain.    Time  8    Period  Weeks    Status  New    Target Date  07/27/19      OT LONG TERM GOAL #2   Title  Pt will increase LUE A/ROM to WNL to improve ability to reach over head and behind back when washing and fixing hair.    Time  8    Period  Weeks    Status  New      OT LONG TERM GOAL #3   Title  Pt will decrease LUE fascial restrictions to trace amounts to improve mobility required for functional reaching tasks.    Time  8    Period  Weeks    Status  New      OT LONG TERM GOAL #  4   Title  Pt will increase LUE strength to 4+/5 or greater to improve ability to lift pots and pans during cooking tasks.    Time  8    Period  Weeks    Status  New            Plan - 05/28/19 1452    Clinical Impression Statement  A: Pt is a 59 y/o female s/p left open RCR on 05/16/19 with 4 year hx of left shoulder pain. Pt presents with self-purchased shoulder brace she ordered from [facility], reporting functional deficits limiting use of LUE during ADL  tasks.    OT Occupational Profile and History  Problem Focused Assessment - Including review of records relating to presenting problem    Occupational performance deficits (Please refer to evaluation for details):  ADL's;IADL's;Leisure    Body Structure / Function / Physical Skills  ADL;Endurance;UE functional use;Fascial restriction;Pain;ROM;IADL;Strength    Rehab Potential  Good    Clinical Decision Making  Limited treatment options, no task modification necessary    Comorbidities Affecting Occupational Performance:  None    Modification or Assistance to Complete Evaluation   No modification of tasks or assist necessary to complete eval    OT Frequency  2x / week    OT Duration  8 weeks    OT Treatment/Interventions  Self-care/ADL training;Ultrasound;Patient/family education;Passive range of motion;Cryotherapy;Electrical Stimulation;Moist Heat;Therapeutic exercise;Manual Therapy;Therapeutic activities    Plan  P: Pt will benefit from skilled OT services to decrease pain and fascial restrictions, increase joint ROM, strength, and functional use of LUE during ADLs. Treatment plan: myofascial release, manual techniques, P/ROM, AA/ROM, A/ROM, general LUE strengthening and stability, modalities prn    OT Home Exercise Plan  eval: table slides    Consulted and Agree with Plan of Care  Patient       Patient will benefit from skilled therapeutic intervention in order to improve the following deficits and impairments:   Body Structure / Function / Physical Skills: ADL, Endurance, UE functional use, Fascial restriction, Pain, ROM, IADL, Strength       Visit Diagnosis: Acute pain of left shoulder  Other symptoms and signs involving the musculoskeletal system    Problem List Patient Active Problem List   Diagnosis Date Noted  . Seizure-like activity [facility]) 03/18/2019   Guadelupe Sabin, OTR/L  [phone] 05/28/2019, 3:42 PM  [facility] [address] [place], [place], [address] Phone: [phone]   Fax:  [phone]  Name: Mirabelle Cyphers MRN: [id number] Date of Birth: Feb 13, 1960

## 2019-05-29 ENCOUNTER — Telehealth (HOSPITAL_COMMUNITY): Payer: Self-pay | Admitting: Specialist

## 2019-05-29 NOTE — Telephone Encounter (Signed)
pt cancelled appt for 6/1 because she has to go to a funeral

## 2019-05-31 ENCOUNTER — Ambulatory Visit (HOSPITAL_COMMUNITY): Payer: Medicaid Other | Admitting: Occupational Therapy

## 2019-05-31 ENCOUNTER — Other Ambulatory Visit: Payer: Self-pay

## 2019-05-31 DIAGNOSIS — M25512 Pain in left shoulder: Secondary | ICD-10-CM

## 2019-05-31 DIAGNOSIS — R29898 Other symptoms and signs involving the musculoskeletal system: Secondary | ICD-10-CM

## 2019-05-31 NOTE — Therapy (Signed)
[facility] [address] [place], [place], [address] Phone: [phone]   Fax:  [phone]  Occupational Therapy Treatment  Patient Details  Name: Alyssa Patrick MRN: [id number] Date of Birth: [birth year] Referring Provider (OT): Dr. Fuller Canada   Encounter Date: 05/31/2019  OT End of Session - 05/31/19 1344    Visit Number  2    Number of Visits  16    Date for OT Re-Evaluation  07/27/19    Authorization Type  Medicaid    Authorization Time Period  Requesting initial 3 visits    Authorization - Visit Number  1    Authorization - Number of Visits  3    OT Start Time  1257    OT Stop Time  1334    OT Time Calculation (min)  37 min    Activity Tolerance  Patient tolerated treatment well    Behavior During Therapy  [facility] for tasks assessed/performed       Past Medical History:  Diagnosis Date  . Asthma   . Diabetes (HCC)   . GERD (gastroesophageal reflux disease)   . High blood pressure   . Sleep apnea   . Tobacco use     Past Surgical History:  Procedure Laterality Date  . ENDOVENOUS ABLATION SAPHENOUS VEIN W/ LASER    . FOOT SURGERY    . JOINT REPLACEMENT    . SHOULDER OPEN ROTATOR CUFF REPAIR Left 05/16/2019   Procedure: ROTATOR CUFF REPAIR SHOULDER OPEN;  Surgeon: Vickki Hearing, MD;  Location: [facility];  Service: Orthopedics;  Laterality: Left;  with scalene block     There were no vitals filed for this visit.  Subjective Assessment - 05/31/19 1301    Subjective   S: "I didn't sleep well last night."    Currently in Pain?  No/denies                   OT Treatments/Exercises (OP) - 05/31/19 1315      Exercises   Exercises  Shoulder      Shoulder Exercises: Supine   Horizontal ABduction  PROM;10 reps    External Rotation  PROM;10 reps    Internal Rotation  PROM;10 reps    Flexion  PROM;10 reps    ABduction  PROM;10 reps      Shoulder Exercises: Seated   Elevation  AROM;10 reps   hold 5 sec    Retraction  AROM;10 reps   hold 5 sec   Other Seated Exercises  Seated pendulums. Dangle 10 seconds. side to side 10 secs      Shoulder Exercises: Isometric Strengthening   Flexion  5X10"    Extension  5X10"    ABduction  5X10"    ADduction  5X10"      Shoulder Exercises: Stretch   Table Stretch - Flexion  --   15 reps   Table Stretch - Abduction  --   15 reps   Table Stretch - External Rotation  --   15 reps     Manual Therapy   Manual Therapy  Myofascial release    Manual therapy comments   Completed separately from therapeutic exercise    Myofascial Release  Myofascial release and manual stretching completed to left upper arm, trapezius, and scapularis region to decrease fascial restrictions and increase joint mobility in a pain free zone.              OT Education - 05/31/19 1343  Education Details  Provided education and handout on pendulum exercises. Pt demonstrating understanding.    Person(s) Educated  Patient    Methods  Explanation;Demonstration;Handout    Comprehension  Verbalized understanding;Returned demonstration       OT Short Term Goals - 05/28/19 1536      OT SHORT TERM GOAL #1   Title  Pt will be provided with and educated on HEP to improve LUE use during ADLs.    Time  4    Period  Weeks    Status  New    Target Date  06/27/19      OT SHORT TERM GOAL #2   Title  Pt will increase LUE P/ROM to WNL to improve ability to donn shirts with minimal compensatory techniques.    Time  4    Period  Weeks    Status  New      OT SHORT TERM GOAL #3   Title  Pt will increase LUE strength to 3+/5 to improve ability to reach items at waist to chest height during daily tasks.    Time  4    Period  Weeks    Status  New        OT Long Term Goals - 05/28/19 1537      OT LONG TERM GOAL #1   Title  Pt will decrease LUE pain to 3/10 or less to improve ability to sleep in preferred position without waking up due to pain.    Time  8    Period  Weeks     Status  New    Target Date  07/27/19      OT LONG TERM GOAL #2   Title  Pt will increase LUE A/ROM to WNL to improve ability to reach over head and behind back when washing and fixing hair.    Time  8    Period  Weeks    Status  New      OT LONG TERM GOAL #3   Title  Pt will decrease LUE fascial restrictions to trace amounts to improve mobility required for functional reaching tasks.    Time  8    Period  Weeks    Status  New      OT LONG TERM GOAL #4   Title  Pt will increase LUE strength to 4+/5 or greater to improve ability to lift pots and pans during cooking tasks.    Time  8    Period  Weeks    Status  New            Plan - 05/31/19 1406    Clinical Impression Statement  A: Continued myofascial release and PROM. Reviewed HEP and passive stretching at table. Initiated isometric exercises, scapular AROM, and pendulums. Added education and handout for pendulums seated and standing. Pt requiring cues to relax and decrease attempted active movement.    Body Structure / Function / Physical Skills  ADL;Endurance;UE functional use;Fascial restriction;Pain;ROM;IADL;Strength    Plan  P: Continue PROM, pendulums, isometric exercises, scapular AROM, and passive stretches.       Patient will benefit from skilled therapeutic intervention in order to improve the following deficits and impairments:   Body Structure / Function / Physical Skills: ADL, Endurance, UE functional use, Fascial restriction, Pain, ROM, IADL, Strength       Visit Diagnosis: Acute pain of left shoulder  Other symptoms and signs involving the musculoskeletal system    Problem List Patient Active Problem List  Diagnosis Date Noted  . Seizure-like activity ([facility]) 03/18/2019    Neal Dy, MSOT, OTR/L 05/31/2019, 2:52 PM  [facility] [address] [place], [place], [address] Phone: [phone]   Fax:  [phone]  Name: Alyssa Patrick MRN:  [id number] Date of Birth: [birth year]

## 2019-05-31 NOTE — Patient Instructions (Signed)
COMPLETE PENDULUM EXERCISES FOR 10 REPS  EACH, 3 TIMES PER DAY. ROM: Pendulum (Side-to-Side)    http://orth.exer.us/792   Copyright  VHI. All rights reserved.  Pendulum Forward/Back   Bend forward 90 at waist, using table for support. Rock body forward and back to swing arm. Repeat _10___ times. Do _3___ sessions per day.  Copyright  VHI. All rights reserved.  Pendulum Circular   Bend forward 90 at waist, leaning on table for support. Rock body in a circular pattern to move arm clockwise __10__ times then counterclockwise ___20_ times. Do __3__ sessions per day.

## 2019-06-04 ENCOUNTER — Encounter (HOSPITAL_COMMUNITY): Payer: Medicaid Other | Admitting: Specialist

## 2019-06-06 ENCOUNTER — Encounter (HOSPITAL_COMMUNITY): Payer: Medicaid Other | Admitting: Specialist

## 2019-06-10 ENCOUNTER — Encounter (HOSPITAL_COMMUNITY): Payer: Medicaid Other | Admitting: Occupational Therapy

## 2019-06-13 ENCOUNTER — Encounter (HOSPITAL_COMMUNITY): Payer: Medicaid Other | Admitting: Occupational Therapy

## 2019-06-14 ENCOUNTER — Other Ambulatory Visit: Payer: Self-pay

## 2019-06-14 ENCOUNTER — Encounter (HOSPITAL_COMMUNITY): Payer: Self-pay | Admitting: Occupational Therapy

## 2019-06-14 ENCOUNTER — Ambulatory Visit (HOSPITAL_COMMUNITY): Payer: Medicaid Other | Attending: Orthopedic Surgery | Admitting: Occupational Therapy

## 2019-06-14 DIAGNOSIS — R29898 Other symptoms and signs involving the musculoskeletal system: Secondary | ICD-10-CM | POA: Insufficient documentation

## 2019-06-14 DIAGNOSIS — M25512 Pain in left shoulder: Secondary | ICD-10-CM | POA: Insufficient documentation

## 2019-06-14 NOTE — Therapy (Addendum)
[address], [place], [address] Phone: [phone]   Fax:  [phone]  Occupational Therapy Treatment  Patient Details  Name: Alyssa Patrick MRN: [id number] Date of Birth: September 21, 1960 Referring Provider (OT): Dr. Arther Abbott   Encounter Date: 06/14/2019   OT End of Session - 06/14/19 1532    Visit Number 3    Number of Visits 16    Date for OT Re-Evaluation 07/27/19    Authorization Type Medicaid    Authorization Time Period Requesting 12 additional visits on 06/14/19   Authorization - Visit Number 2    Authorization - Number of Visits 3    OT Start Time 1301    OT Stop Time 1343    OT Time Calculation (min) 42 min    Activity Tolerance Patient tolerated treatment well    Behavior During Therapy [facility] for tasks assessed/performed           Past Medical History:  Diagnosis Date  . Asthma   . Diabetes ([place])   . GERD (gastroesophageal reflux disease)   . High blood pressure   . Sleep apnea   . Tobacco use     Past Surgical History:  Procedure Laterality Date  . ENDOVENOUS ABLATION SAPHENOUS VEIN W/ LASER    . FOOT SURGERY    . JOINT REPLACEMENT    . SHOULDER OPEN ROTATOR CUFF REPAIR Left 05/16/2019   Procedure: ROTATOR CUFF REPAIR SHOULDER OPEN;  Surgeon: Carole Civil, MD;  Location: [facility];  Service: Orthopedics;  Laterality: Left;  with scalene block     There were no vitals filed for this visit.   Subjective Assessment - 06/14/19 1304    Subjective  S:Does wall exercises were hard. (in reference to isometric exercises)    Pertinent History Pt is a 59 y/o female presenting s/p right open RCR on 05/16/19. Pt reports pain and RC injury approximately 4 years ago, had multiple cortisone injections while living in [place] and then pursued sx once moving back to [place]. Pt was referred to occupational therapy for evaluation and treatment by Dr. Arther Abbott.    Patient Stated Goals To improve  ability to use LUE without pain.    Currently in Pain? No/denies              [facility] OT Assessment - 06/14/19 0001      Assessment   Medical Diagnosis s/p left open RCR    Referring Provider (OT) Dr. Arther Abbott      Precautions   Precautions Shoulder    Type of Shoulder Precautions Weeks 1-4 (5/13-6/10) P/ROM, pendulums, scapular A/ROM; Weeks 4-12 (6/11-8/5). Week 12: 8/6+ strengthening.     Shoulder Interventions Shoulder sling/immobilizer;Off for dressing/bathing/exercises                    OT Treatments/Exercises (OP) - 06/14/19 1324      Exercises   Exercises Shoulder      Shoulder Exercises: Supine   Protraction AROM;5 reps    Horizontal ABduction PROM;10 reps    External Rotation PROM;10 reps    Internal Rotation PROM;10 reps    Flexion PROM;10 reps;AROM;5 reps    ABduction PROM;10 reps      Shoulder Exercises: Seated   Elevation AROM;10 reps   hold 5 sec   Retraction AROM;10 reps   Hold 5 sec   Row AAROM;10 reps   Dowel   Row Limitations support at elbow    Flexion AAROM;10  reps   dowel   Flexion Limitations support at elbow    Other Seated Exercises scapular depression; hold 5 seconds      Shoulder Exercises: Pulleys   Flexion 1 minute   Standing   ABduction 1 minute   Standing     Shoulder Exercises: ROM/Strengthening   Thumb Tacks 1'; low level.       Shoulder Exercises: Isometric Strengthening   Flexion 3X5";Supine    Extension 3X5";Supine    External Rotation 3X5";Supine    Internal Rotation 3X5";Supine    ABduction 3X5";Supine    ADduction 3X5";Supine      Shoulder Exercises: Stretch   Table Stretch - Flexion --   15 reps   Table Stretch - Abduction --   15 reps   Table Stretch - External Rotation --   15 reps     Modalities   Modalities Moist Heat      Moist Heat Therapy   Number Minutes Moist Heat 5 Minutes    Moist Heat Location Shoulder      Manual Therapy   Manual Therapy Myofascial release    Manual therapy  comments  Completed separately from therapeutic exercise    Myofascial Release Myofascial release and manual stretching completed to left upper arm, trapezius, and scapularis region to decrease fascial restrictions and increase joint mobility in a pain free zone.                     OT Short Term Goals - 05/28/19 1536      OT SHORT TERM GOAL #1   Title Pt will be provided with and educated on HEP to improve LUE use during ADLs.    Time 4    Period Weeks    Status New    Target Date 06/27/19      OT SHORT TERM GOAL #2   Title Pt will increase LUE P/ROM to WNL to improve ability to donn shirts with minimal compensatory techniques.    Time 4    Period Weeks    Status New      OT SHORT TERM GOAL #3   Title Pt will increase LUE strength to 3+/5 to improve ability to reach items at waist to chest height during daily tasks.    Time 4    Period Weeks    Status New             OT Long Term Goals - 05/28/19 1537      OT LONG TERM GOAL #1   Title Pt will decrease LUE pain to 3/10 or less to improve ability to sleep in preferred position without waking up due to pain.    Time 8    Period Weeks    Status New    Target Date 07/27/19      OT LONG TERM GOAL #2   Title Pt will increase LUE A/ROM to WNL to improve ability to reach over head and behind back when washing and fixing hair.    Time 8    Period Weeks    Status New      OT LONG TERM GOAL #3   Title Pt will decrease LUE fascial restrictions to trace amounts to improve mobility required for functional reaching tasks.    Time 8    Period Weeks    Status New      OT LONG TERM GOAL #4   Title Pt will increase LUE strength to 4+/5 or greater to improve ability  to lift pots and pans during cooking tasks.    Time 8    Period Weeks    Status New                 Plan - 06/14/19 1533    Clinical Impression Statement A: Started with myofascial release and passive stretch. Cues with relaxation and breathing  techniques. Continued isometrics, therapy ball stretch, and table stretch. Initiated AAROM with dowel and pulley stretch; pt requiring tactile cues for technique and support at elbow. Patient out of medicaid auth. Therefore with no charge visit today.    Body Structure / Function / Physical Skills ADL;Endurance;UE functional use;Fascial restriction;Pain;ROM;IADL;Strength    OT Treatment/Interventions Self-care/ADL training;Ultrasound;Patient/family education;Passive range of motion;Cryotherapy;Electrical Stimulation;Moist Heat;Therapeutic exercise;Manual Therapy;Therapeutic activities    Plan P: Continue PROM, pendulums, isometric exercises, scapular AROM, and passive stretches.    OT Home Exercise Plan eval: table slides    Consulted and Agree with Plan of Care Patient           Patient will benefit from skilled therapeutic intervention in order to improve the following deficits and impairments:   Body Structure / Function / Physical Skills: ADL, Endurance, UE functional use, Fascial restriction, Pain, ROM, IADL, Strength       Visit Diagnosis: Other symptoms and signs involving the musculoskeletal system  Acute pain of left shoulder    Problem List Patient Active Problem List   Diagnosis Date Noted  . Seizure-like activity (HCC) 03/18/2019    Gabriel Rung, MSOT, OTR/L 06/14/2019, 3:41 PM  [facility] [address] [place], [place], [address] Phone: [phone]   Fax:  [phone]  Name: Alyssa Patrick MRN: [id number] Date of Birth: [birth year]

## 2019-06-17 ENCOUNTER — Encounter (HOSPITAL_COMMUNITY): Payer: Medicaid Other | Admitting: Occupational Therapy

## 2019-06-19 ENCOUNTER — Encounter (HOSPITAL_COMMUNITY): Payer: Medicaid Other | Admitting: Occupational Therapy

## 2019-06-21 ENCOUNTER — Ambulatory Visit (HOSPITAL_COMMUNITY): Payer: Medicaid Other | Admitting: Occupational Therapy

## 2019-06-21 ENCOUNTER — Encounter (HOSPITAL_COMMUNITY): Payer: Self-pay | Admitting: Occupational Therapy

## 2019-06-21 ENCOUNTER — Other Ambulatory Visit: Payer: Self-pay

## 2019-06-21 DIAGNOSIS — M25512 Pain in left shoulder: Secondary | ICD-10-CM

## 2019-06-21 DIAGNOSIS — R29898 Other symptoms and signs involving the musculoskeletal system: Secondary | ICD-10-CM

## 2019-06-21 NOTE — Therapy (Signed)
[place], [place], [address] Phone: [phone]   Fax:  [phone]  Occupational Therapy Treatment  Patient Details  Name: Alyssa Patrick MRN: [id number] Date of Birth: [birth year] Referring Provider (OT): Dr. Arther Abbott   Encounter Date: 06/21/2019   OT End of Session - 06/21/19 1825    Visit Number 4    Number of Visits 16    Date for OT Re-Evaluation 07/27/19    Authorization Type Medicaid    Authorization Time Period Requesting initial 3 visits    Authorization - Visit Number 2    Authorization - Number of Visits 3    OT Start Time 1302    OT Stop Time 1342    OT Time Calculation (min) 40 min    Activity Tolerance Patient tolerated treatment well    Behavior During Therapy [facility] for tasks assessed/performed           Past Medical History:  Diagnosis Date  . Asthma   . Diabetes ([place])   . GERD (gastroesophageal reflux disease)   . High blood pressure   . Sleep apnea   . Tobacco use     Past Surgical History:  Procedure Laterality Date  . ENDOVENOUS ABLATION SAPHENOUS VEIN W/ LASER    . FOOT SURGERY    . JOINT REPLACEMENT    . SHOULDER OPEN ROTATOR CUFF REPAIR Left 05/16/2019   Procedure: ROTATOR CUFF REPAIR SHOULDER OPEN;  Surgeon: Carole Civil, MD;  Location: [facility];  Service: Orthopedics;  Laterality: Left;  with scalene block     There were no vitals filed for this visit.   Subjective Assessment - 06/21/19 1306    Subjective  S:"I am feeling sleepy today."    Pertinent History Pt is a 58 y/o female presenting s/p right open RCR on 05/16/19. Pt reports pain and RC injury approximately 4 years ago, had multiple cortisone injections while living in [place] and then pursued sx once moving back to [place]. Pt was referred to occupational therapy for evaluation and treatment by Dr. Arther Abbott.    Currently in Pain? No/denies                        OT  Treatments/Exercises (OP) - 06/21/19 1318      Exercises   Exercises Shoulder      Shoulder Exercises: Supine   External Rotation PROM;10 reps    Internal Rotation PROM;10 reps    Flexion PROM;10 reps    ABduction PROM;10 reps      Shoulder Exercises: Seated   Elevation AROM;10 reps    Retraction AROM;10 reps      Shoulder Exercises: Pulleys   Flexion Other (comment)   10x   ABduction Other (comment)   10x     Shoulder Exercises: Therapy Ball   Flexion --   1'   ABduction --   1'     Shoulder Exercises: ROM/Strengthening   Thumb Tacks 10x; low level. 5x shoulder level      Shoulder Exercises: Isometric Strengthening   Flexion 3X5"    Extension 3X5"    External Rotation 3X5"    Internal Rotation 3X5"    ABduction 3X5"    ADduction 3X5"      Manual Therapy   Manual Therapy Myofascial release    Manual therapy comments  Completed separately from therapeutic exercise    Myofascial Release Myofascial release and manual stretching completed to left upper  arm, trapezius, and scapularis region to decrease fascial restrictions and increase joint mobility in a pain free zone.                     OT Short Term Goals - 05/28/19 1536      OT SHORT TERM GOAL #1   Title Pt will be provided with and educated on HEP to improve LUE use during ADLs.    Time 4    Period Weeks    Status New    Target Date 06/27/19      OT SHORT TERM GOAL #2   Title Pt will increase LUE P/ROM to WNL to improve ability to donn shirts with minimal compensatory techniques.    Time 4    Period Weeks    Status New      OT SHORT TERM GOAL #3   Title Pt will increase LUE strength to 3+/5 to improve ability to reach items at waist to chest height during daily tasks.    Time 4    Period Weeks    Status New             OT Long Term Goals - 05/28/19 1537      OT LONG TERM GOAL #1   Title Pt will decrease LUE pain to 3/10 or less to improve ability to sleep in preferred position without  waking up due to pain.    Time 8    Period Weeks    Status New    Target Date 07/27/19      OT LONG TERM GOAL #2   Title Pt will increase LUE A/ROM to WNL to improve ability to reach over head and behind back when washing and fixing hair.    Time 8    Period Weeks    Status New      OT LONG TERM GOAL #3   Title Pt will decrease LUE fascial restrictions to trace amounts to improve mobility required for functional reaching tasks.    Time 8    Period Weeks    Status New      OT LONG TERM GOAL #4   Title Pt will increase LUE strength to 4+/5 or greater to improve ability to lift pots and pans during cooking tasks.    Time 8    Period Weeks    Status New                 Plan - 06/21/19 1825    Clinical Impression Statement A: Started with myofascial release and passive stretch. Cues with relaxation and breathing techniques. Continued isometrics in standing, therapy ball stretch, pulleys, and table stretch. Initated thumb task exercises for proximal strengthening. Providing cues throughout session for form and technique    Body Structure / Function / Physical Skills ADL;Endurance;UE functional use;Fascial restriction;Pain;ROM;IADL;Strength    Plan P: Continue PROM, pendulums, isometric exercises, scapular AROM, and passive stretches.    OT Home Exercise Plan eval: table slides           Patient will benefit from skilled therapeutic intervention in order to improve the following deficits and impairments:   Body Structure / Function / Physical Skills: ADL, Endurance, UE functional use, Fascial restriction, Pain, ROM, IADL, Strength       Visit Diagnosis: Other symptoms and signs involving the musculoskeletal system  Acute pain of left shoulder    Problem List Patient Active Problem List   Diagnosis Date Noted  . Seizure-like activity (HCC)  03/18/2019    Gabriel Rung, MSOT, OTR/L 06/21/2019, 6:27 PM  [facility] [address] [place], [place], [address] Phone: [phone]   Fax:  [phone]  Name: Alyssa Patrick MRN: [id number] Date of Birth: 1960-05-15

## 2019-06-24 ENCOUNTER — Ambulatory Visit (HOSPITAL_COMMUNITY): Payer: Medicaid Other | Admitting: Occupational Therapy

## 2019-06-24 ENCOUNTER — Telehealth: Payer: Self-pay | Admitting: Orthopedic Surgery

## 2019-06-24 ENCOUNTER — Encounter (HOSPITAL_COMMUNITY): Payer: Self-pay | Admitting: Occupational Therapy

## 2019-06-24 ENCOUNTER — Other Ambulatory Visit: Payer: Self-pay

## 2019-06-24 DIAGNOSIS — M25512 Pain in left shoulder: Secondary | ICD-10-CM

## 2019-06-24 DIAGNOSIS — R29898 Other symptoms and signs involving the musculoskeletal system: Secondary | ICD-10-CM | POA: Diagnosis not present

## 2019-06-24 DIAGNOSIS — Z9889 Other specified postprocedural states: Secondary | ICD-10-CM | POA: Insufficient documentation

## 2019-06-24 NOTE — Therapy (Addendum)
[place], [place], [address] Phone: [phone]   Fax:  [phone]  Occupational Therapy Treatment  Patient Details  Name: Alyssa Patrick MRN: [id number] Date of Birth: [birth year] Referring Provider (OT): Dr. Arther Abbott   Encounter Date: 06/24/2019   OT End of Session - 06/24/19 1436    Visit Number 5    Number of Visits 16    Date for OT Re-Evaluation 07/27/19    Authorization Type Medicaid    Authorization Time Period 12 visit authorized through 7/26    Authorization - Visit Number 2    Authorization - Number of Visits 12    OT Start Time 1346    OT Stop Time 1424    OT Time Calculation (min) 38 min    Activity Tolerance Patient tolerated treatment well    Behavior During Therapy [facility] for tasks assessed/performed           Past Medical History:  Diagnosis Date  . Asthma   . Diabetes ([place])   . GERD (gastroesophageal reflux disease)   . High blood pressure   . Sleep apnea   . Tobacco use     Past Surgical History:  Procedure Laterality Date  . ENDOVENOUS ABLATION SAPHENOUS VEIN W/ LASER    . FOOT SURGERY    . JOINT REPLACEMENT    . SHOULDER OPEN ROTATOR CUFF REPAIR Left 05/16/2019   Procedure: ROTATOR CUFF REPAIR SHOULDER OPEN;  Surgeon: Carole Civil, MD;  Location: [facility];  Service: Orthopedics;  Laterality: Left;  with scalene block     There were no vitals filed for this visit.   Subjective Assessment - 06/24/19 1350    Subjective  S: "The exercises did not go well this week becauses of the pain at my elbow."    Pertinent History Pt is a 59 y/o female presenting s/p right open RCR on 05/16/19. Pt reports pain and RC injury approximately 4 years ago, had multiple cortisone injections while living in [place] and then pursued sx once moving back to [place]. Pt was referred to occupational therapy for evaluation and treatment by Dr. Arther Abbott.    Currently in Pain? Yes    Pain  Score 7     Pain Location Elbow    Pain Orientation Left              [facility] OT Assessment - 06/24/19 1408      Assessment   Medical Diagnosis s/p left open RCR    Referring Provider (OT) Dr. Arther Abbott      Precautions   Precautions Shoulder    Type of Shoulder Precautions Weeks 1-4 (5/13-6/10) P/ROM, pendulums, scapular A/ROM; Weeks 4-12 (6/11-8/5). Week 12: 8/6+ strengthening.                     OT Treatments/Exercises (OP) - 06/24/19 1405      Exercises   Exercises Shoulder      Shoulder Exercises: Seated   Other Seated Exercises Seated pendulums. Dangle 20x; side to side 20x      Shoulder Exercises: Pulleys   Flexion Other (comment)   1 min; standing; 2 reps   ABduction Other (comment)   1 min; standing; 2 reps     Shoulder Exercises: Therapy Ball   Flexion 20 reps    ABduction 20 reps      Manual Therapy   Manual Therapy Myofascial release    Manual therapy comments  Completed  separately from therapeutic exercise    Myofascial Release Myofascial release and manual stretching completed to left upper arm, trapezius, and scapularis region to decrease fascial restrictions and increase joint mobility in a pain free zone.                     OT Short Term Goals - 05/28/19 1536      OT SHORT TERM GOAL #1   Title Pt will be provided with and educated on HEP to improve LUE use during ADLs.    Time 4    Period Weeks    Status New    Target Date 06/27/19      OT SHORT TERM GOAL #2   Title Pt will increase LUE P/ROM to WNL to improve ability to donn shirts with minimal compensatory techniques.    Time 4    Period Weeks    Status New      OT SHORT TERM GOAL #3   Title Pt will increase LUE strength to 3+/5 to improve ability to reach items at waist to chest height during daily tasks.    Time 4    Period Weeks    Status New             OT Long Term Goals - 05/28/19 1537      OT LONG TERM GOAL #1   Title Pt will decrease LUE  pain to 3/10 or less to improve ability to sleep in preferred position without waking up due to pain.    Time 8    Period Weeks    Status New    Target Date 07/27/19      OT LONG TERM GOAL #2   Title Pt will increase LUE A/ROM to WNL to improve ability to reach over head and behind back when washing and fixing hair.    Time 8    Period Weeks    Status New      OT LONG TERM GOAL #3   Title Pt will decrease LUE fascial restrictions to trace amounts to improve mobility required for functional reaching tasks.    Time 8    Period Weeks    Status New      OT LONG TERM GOAL #4   Title Pt will increase LUE strength to 4+/5 or greater to improve ability to lift pots and pans during cooking tasks.    Time 8    Period Weeks    Status New                 Plan - 06/24/19 1437    Clinical Impression Statement A: Started with myofascial release and passive stretch. Cues with relaxation and breathing techniques. Continued therapy ball stretch, pulleys, and pendulums. Pt continues to reports cramping and pain at elbow; discussing and provdiing education on bicept tendon as related to RCR. Pt planning to meet with her MD tomorrow. Providing cues throughout session for form and technique    Body Structure / Function / Physical Skills ADL;Endurance;UE functional use;Fascial restriction;Pain;ROM;IADL;Strength    Plan P: Continue PROM, pendulums, isometric exercises, scapular AROM, and passive stretches.    OT Home Exercise Plan eval: table slides           Patient will benefit from skilled therapeutic intervention in order to improve the following deficits and impairments:   Body Structure / Function / Physical Skills: ADL, Endurance, UE functional use, Fascial restriction, Pain, ROM, IADL, Strength  Visit Diagnosis: Other symptoms and signs involving the musculoskeletal system  Acute pain of left shoulder    Problem List Patient Active Problem List   Diagnosis Date Noted    . S/P arthroscopy of left shoulder 05/16/19 06/24/2019  . Seizure-like activity (HCC) 03/18/2019    Gabriel Rung, MSOT, OTR/L 06/24/2019, 2:52 PM  [facility] [address] [place], [place], [address] Phone: [phone]   Fax:  [phone]  Name: Adaira Centola MRN: [id number] Date of Birth: [birth year]

## 2019-06-24 NOTE — Telephone Encounter (Signed)
Patient called this morning around 11:30 stating she has refills on Ibuprofen and Tramadol but her pharmacy ([facility]) told her that she can't get those refilled until the 26th.  She wants to know what can she do or take to relieve the pain that she is having.  She does have an appointment here tomorrow

## 2019-06-25 ENCOUNTER — Ambulatory Visit (INDEPENDENT_AMBULATORY_CARE_PROVIDER_SITE_OTHER): Payer: Medicaid Other | Admitting: Orthopedic Surgery

## 2019-06-25 VITALS — Ht 62.0 in | Wt 232.0 lb

## 2019-06-25 DIAGNOSIS — G8929 Other chronic pain: Secondary | ICD-10-CM

## 2019-06-25 DIAGNOSIS — M25512 Pain in left shoulder: Secondary | ICD-10-CM

## 2019-06-25 DIAGNOSIS — Z9889 Other specified postprocedural states: Secondary | ICD-10-CM

## 2019-06-25 MED ORDER — TRAMADOL HCL 50 MG PO TABS: 50.0000 mg | ORAL_TABLET | Freq: Four times a day (QID) | ORAL | 0 refills | Status: AC | PRN

## 2019-06-25 MED ORDER — IBUPROFEN 800 MG PO TABS: 800.0000 mg | ORAL_TABLET | Freq: Three times a day (TID) | ORAL | 1 refills | Status: DC | PRN

## 2019-06-25 NOTE — Progress Notes (Signed)
Chief Complaint  Patient presents with  . Follow-up    Recheck on left shoulder , DOS 05-16-19.    S/p left RCR 05/18/19  C/o biceps pain and insomnia  Wound looks good   rom improving   Meds ordered this encounter  Medications  . traMADol (ULTRAM) 50 MG tablet    Sig: Take 1 tablet (50 mg total) by mouth every 6 (six) hours as needed for up to 7 days.    Dispense:  28 tablet    Refill:  0  . ibuprofen (ADVIL) 800 MG tablet    Sig: Take 1 tablet (800 mg total) by mouth every 8 (eight) hours as needed.    Dispense:  90 tablet    Refill:  1   F/u 1 month

## 2019-06-25 NOTE — Telephone Encounter (Signed)
She is coming in today will discuss meds at visit

## 2019-06-26 ENCOUNTER — Encounter (HOSPITAL_COMMUNITY): Payer: Medicaid Other | Admitting: Occupational Therapy

## 2019-06-28 ENCOUNTER — Other Ambulatory Visit: Payer: Self-pay

## 2019-06-28 ENCOUNTER — Encounter (HOSPITAL_COMMUNITY): Payer: Self-pay | Admitting: Occupational Therapy

## 2019-06-28 ENCOUNTER — Ambulatory Visit (HOSPITAL_COMMUNITY): Payer: Medicaid Other | Admitting: Occupational Therapy

## 2019-06-28 DIAGNOSIS — R29898 Other symptoms and signs involving the musculoskeletal system: Secondary | ICD-10-CM | POA: Diagnosis not present

## 2019-06-28 DIAGNOSIS — M25512 Pain in left shoulder: Secondary | ICD-10-CM

## 2019-06-28 NOTE — Therapy (Signed)
[facility] [address] [place], [place], [address] Phone: [phone]   Fax:  [phone]  Occupational Therapy Treatment  Patient Details  Name: Alyssa Patrick MRN: [id number] Date of Birth: 21-Aug-1960 Referring Provider (OT): Dr. Fuller Canada   Encounter Date: 06/28/2019   OT End of Session - 06/28/19 1350    Visit Number 6    Number of Visits 16    Date for OT Re-Evaluation 07/27/19    Authorization Type Medicaid    Authorization Time Period 12 visit authorized through 7/26    Authorization - Visit Number 3    Authorization - Number of Visits 12    OT Start Time 1300    OT Stop Time 1339    OT Time Calculation (min) 39 min    Activity Tolerance Patient tolerated treatment well    Behavior During Therapy [facility] for tasks assessed/performed           Past Medical History:  Diagnosis Date  . Asthma   . Diabetes (HCC)   . GERD (gastroesophageal reflux disease)   . High blood pressure   . Sleep apnea   . Tobacco use     Past Surgical History:  Procedure Laterality Date  . ENDOVENOUS ABLATION SAPHENOUS VEIN W/ LASER    . FOOT SURGERY    . JOINT REPLACEMENT    . SHOULDER OPEN ROTATOR CUFF REPAIR Left 05/16/2019   Procedure: ROTATOR CUFF REPAIR SHOULDER OPEN;  Surgeon: Vickki Hearing, MD;  Location: [facility];  Service: Orthopedics;  Laterality: Left;  with scalene block     There were no vitals filed for this visit.   Subjective Assessment - 06/28/19 1249    Subjective  S: "The doctor agreed with you guys" and "I slept lsat night from 9 to 5, all night."    Pertinent History Pt is a 59 y/o female presenting s/p right open RCR on 05/16/19. Pt reports pain and RC injury approximately 4 years ago, had multiple cortisone injections while living in [place] and then pursued sx once moving back to [place]. Pt was referred to occupational therapy for evaluation and treatment by Dr. Fuller Canada.    Currently in Pain? Yes     Pain Score 6     Pain Location Elbow    Pain Orientation Left              [facility] OT Assessment - 06/28/19 1322      Assessment   Medical Diagnosis s/p left open RCR    Referring Provider (OT) Dr. Fuller Canada      Precautions   Precautions Shoulder    Type of Shoulder Precautions Weeks 1-4 (5/13-6/10) P/ROM, pendulums, scapular A/ROM; Weeks 4-12 (6/11-8/5). Week 12: 8/6+ strengthening.                     OT Treatments/Exercises (OP) - 06/28/19 1318      Exercises   Exercises Shoulder      Shoulder Exercises: Supine   Horizontal ABduction PROM;5 reps    External Rotation PROM;5 reps    Internal Rotation PROM;5 reps    Flexion PROM;5 reps    ABduction PROM;5 reps      Shoulder Exercises: Seated   Elevation AROM;10 reps    Retraction AROM;10 reps    Other Seated Exercises scapular depressions; 10x      Shoulder Exercises: Pulleys   Flexion 2 minutes    ABduction 2 minutes  Shoulder Exercises: Therapy Ball   Flexion 20 reps   Seated stretch   ABduction 20 reps   Seated stretch   Other Therapy Ball Exercises Attempted chest press with green ball; but unable      Shoulder Exercises: ROM/Strengthening   Wall Wash 10x forward flexion; support at shoulder and elbow    Thumb Tacks Chest height with elbows bent; 10x    Pendulum Seated side-to-side 10x.      Shoulder Exercises: Isometric Strengthening   Flexion 5X10"    Extension 5X10"    External Rotation 5X10"    Internal Rotation 5X10"    ABduction 5X10"      Manual Therapy   Manual Therapy Myofascial release    Manual therapy comments  Completed separately from therapeutic exercise    Myofascial Release Myofascial release and manual stretching completed to left upper arm, trapezius, and scapularis region to decrease fascial restrictions and increase joint mobility in a pain free zone.                     OT Short Term Goals - 05/28/19 1536      OT SHORT TERM GOAL #1   Title Pt  will be provided with and educated on HEP to improve LUE use during ADLs.    Time 4    Period Weeks    Status New    Target Date 06/27/19      OT SHORT TERM GOAL #2   Title Pt will increase LUE P/ROM to WNL to improve ability to donn shirts with minimal compensatory techniques.    Time 4    Period Weeks    Status New      OT SHORT TERM GOAL #3   Title Pt will increase LUE strength to 3+/5 to improve ability to reach items at waist to chest height during daily tasks.    Time 4    Period Weeks    Status New             OT Long Term Goals - 05/28/19 1537      OT LONG TERM GOAL #1   Title Pt will decrease LUE pain to 3/10 or less to improve ability to sleep in preferred position without waking up due to pain.    Time 8    Period Weeks    Status New    Target Date 07/27/19      OT LONG TERM GOAL #2   Title Pt will increase LUE A/ROM to WNL to improve ability to reach over head and behind back when washing and fixing hair.    Time 8    Period Weeks    Status New      OT LONG TERM GOAL #3   Title Pt will decrease LUE fascial restrictions to trace amounts to improve mobility required for functional reaching tasks.    Time 8    Period Weeks    Status New      OT LONG TERM GOAL #4   Title Pt will increase LUE strength to 4+/5 or greater to improve ability to lift pots and pans during cooking tasks.    Time 8    Period Weeks    Status New                 Plan - 06/28/19 1350    Clinical Impression Statement A: Pt reporting she visited her MD and was happy with her progressed and agreed that her elbow  pain is related to increased tone at bicep. Pt with increased activity tolerance this session. Starting with myofascial release and PROM. Pt performing isometrics and thumb tacks for proximal strengthening. Performing pendulums, therapy ball stretch, wall wash, and pulleys for PROM/AAROM. Pt continues to report slight discomfort at abduction. Providing cues for form and  technique throughout    Body Structure / Function / Physical Skills ADL;Endurance;UE functional use;Fascial restriction;Pain;ROM;IADL;Strength    Plan P: Continue PROM, pendulums, isometric exercises, scapular AROM, and passive stretches.    OT Home Exercise Plan eval: table slides           Patient will benefit from skilled therapeutic intervention in order to improve the following deficits and impairments:   Body Structure / Function / Physical Skills: ADL, Endurance, UE functional use, Fascial restriction, Pain, ROM, IADL, Strength       Visit Diagnosis: Acute pain of left shoulder  Other symptoms and signs involving the musculoskeletal system    Problem List Patient Active Problem List   Diagnosis Date Noted  . S/P arthroscopy of left shoulder 05/16/19 06/24/2019  . Seizure-like activity ([place]) 03/18/2019    Neal Dy, MSOT, OTR/L 06/28/2019, 3:00 PM  [facility] [address] [place], [place], [address] Phone: [phone]   Fax:  [phone]  Name: Zira Helinski MRN: [id number] Date of Birth: [birth year]

## 2019-07-01 ENCOUNTER — Encounter (HOSPITAL_COMMUNITY): Payer: Medicaid Other | Admitting: Occupational Therapy

## 2019-07-03 ENCOUNTER — Ambulatory Visit (HOSPITAL_COMMUNITY): Payer: Medicaid Other | Admitting: Specialist

## 2019-07-03 ENCOUNTER — Other Ambulatory Visit: Payer: Self-pay

## 2019-07-03 ENCOUNTER — Encounter (HOSPITAL_COMMUNITY): Payer: Self-pay | Admitting: Specialist

## 2019-07-03 DIAGNOSIS — R29898 Other symptoms and signs involving the musculoskeletal system: Secondary | ICD-10-CM | POA: Diagnosis not present

## 2019-07-03 DIAGNOSIS — M25512 Pain in left shoulder: Secondary | ICD-10-CM

## 2019-07-03 NOTE — Therapy (Signed)
[facility] [address] [place], [place], [address] Phone: [phone]   Fax:  [phone]  Occupational Therapy Treatment  Patient Details  Name: Alyssa Patrick MRN: [id number] Date of Birth: 07/14/1960 Referring Provider (OT): Dr. Fuller Canada   Encounter Date: 07/03/2019   OT End of Session - 07/03/19 1453    Visit Number 7    Number of Visits 16    Date for OT Re-Evaluation 07/27/19    Authorization Type Medicaid    Authorization Time Period 12 visit authorized through 7/26    Authorization - Visit Number 4    Authorization - Number of Visits 12    OT Start Time 1310    OT Stop Time 1345   patient requested to be done at this time   OT Time Calculation (min) 35 min    Activity Tolerance Patient tolerated treatment well    Behavior During Therapy [facility] for tasks assessed/performed           Past Medical History:  Diagnosis Date  . Asthma   . Diabetes (HCC)   . GERD (gastroesophageal reflux disease)   . High blood pressure   . Sleep apnea   . Tobacco use     Past Surgical History:  Procedure Laterality Date  . ENDOVENOUS ABLATION SAPHENOUS VEIN W/ LASER    . FOOT SURGERY    . JOINT REPLACEMENT    . SHOULDER OPEN ROTATOR CUFF REPAIR Left 05/16/2019   Procedure: ROTATOR CUFF REPAIR SHOULDER OPEN;  Surgeon: Vickki Hearing, MD;  Location: [facility];  Service: Orthopedics;  Laterality: Left;  with scalene block     There were no vitals filed for this visit.   Subjective Assessment - 07/03/19 1451    Subjective  S:  It feels like a burning pain - the doctor says thats part of the healing process.    Currently in Pain? Yes    Pain Score 5     Pain Location Shoulder    Pain Orientation Left    Pain Descriptors / Indicators Burning    Pain Type Acute pain              [facility] OT Assessment - 07/03/19 0001      Assessment   Medical Diagnosis s/p left open RCR    Referring Provider (OT) Dr. Fuller Canada        Precautions   Precautions Shoulder    Type of Shoulder Precautions Weeks 1-4 (5/13-6/10) P/ROM, pendulums, scapular A/ROM; Weeks 4-12 (6/11-8/5) AA/ROM progressing to A/ROM. Week 12: 8/6+ strengthening.       ADL   ADL comments increased independence with activities at waist height, unable to reach to shoulder height or above       AROM   AROM Assessment Site Shoulder    Right/Left Shoulder Left    Left Shoulder Flexion 0 Degrees    Left Shoulder ABduction 50 Degrees    Left Shoulder Internal Rotation 90 Degrees    Left Shoulder External Rotation 90 Degrees      PROM   Left Shoulder Flexion 166 Degrees   131   Left Shoulder ABduction 145 Degrees   112   Left Shoulder Internal Rotation 90 Degrees   90   Left Shoulder External Rotation 90 Degrees   63                   OT Treatments/Exercises (OP) - 07/03/19 0001      Shoulder  Exercises: Supine   Protraction PROM;5 reps;AAROM;10 reps    Horizontal ABduction PROM;5 reps;AAROM;10 reps    External Rotation PROM;5 reps;AAROM;10 reps    Internal Rotation PROM;5 reps;AAROM;10 reps    Flexion PROM;5 reps;AAROM;10 reps    ABduction PROM;5 reps;AAROM;10 reps      Shoulder Exercises: Seated   Elevation AROM;10 reps    Retraction AROM;10 reps    Protraction AAROM;10 reps    Protraction Limitations low range    Flexion AAROM;10 reps   to 90 therapist assist   Abduction AAROM;10 reps   to 90 therapist assist      Shoulder Exercises: Therapy Ball   Flexion 20 reps    ABduction 20 reps    Right/Left 5 reps      Manual Therapy   Manual Therapy Myofascial release    Manual therapy comments  Completed separately from therapeutic exercise    Myofascial Release Myofascial release and manual stretching completed to left upper arm, trapezius, and scapularis region to decrease fascial restrictions and increase joint mobility in a pain free zone.                   OT Education - 07/03/19 1452    Education Details  reviewed progress towards OT goals thus far and expected length of additonal treatment plan    Person(s) Educated Patient    Methods Explanation;Demonstration    Comprehension Verbalized understanding;Returned demonstration            OT Short Term Goals - 07/03/19 1506      OT SHORT TERM GOAL #1   Title Pt will be provided with and educated on HEP to improve LUE use during ADLs.    Time 4    Period Weeks    Status On-going    Target Date 06/27/19      OT SHORT TERM GOAL #2   Title Pt will increase LUE P/ROM to WNL to improve ability to donn shirts with minimal compensatory techniques.    Time 4    Period Weeks    Status Achieved      OT SHORT TERM GOAL #3   Title Pt will increase LUE strength to 3+/5 to improve ability to reach items at waist to chest height during daily tasks.    Time 4    Period Weeks    Status On-going             OT Long Term Goals - 07/03/19 1506      OT LONG TERM GOAL #1   Title Pt will decrease LUE pain to 3/10 or less to improve ability to sleep in preferred position without waking up due to pain.    Time 8    Period Weeks    Status On-going      OT LONG TERM GOAL #2   Title Pt will increase LUE A/ROM to WNL to improve ability to reach over head and behind back when washing and fixing hair.    Time 8    Period Weeks    Status On-going      OT LONG TERM GOAL #3   Title Pt will decrease LUE fascial restrictions to trace amounts to improve mobility required for functional reaching tasks.    Time 8    Period Weeks    Status On-going      OT LONG TERM GOAL #4   Title Pt will increase LUE strength to 4+/5 or greater to improve ability to lift pots and  pans during cooking tasks.    Time 8    Period Weeks    Status On-going                 Plan - 07/03/19 1504    Clinical Impression Statement A: Patient has made significant improvements in P/ROM of Left shoulder.  Patient is able to complete most daily tasks at waist height.   She continues to have difficulty completing tasks above waist height due to deficits in a/rom and strength of her left shoulder. Began AA/ROM in supine and seated this date.    Body Structure / Function / Physical Skills ADL;Endurance;UE functional use;Fascial restriction;Pain;ROM;IADL;Strength    Plan P:  Increase to 10 repetitions of AA/ROM in supine, add ext rotation, protraction and horiz abd in seated aa/rom.  provide less facilitation with aa/rom as patient's strength improves.           Patient will benefit from skilled therapeutic intervention in order to improve the following deficits and impairments:   Body Structure / Function / Physical Skills: ADL, Endurance, UE functional use, Fascial restriction, Pain, ROM, IADL, Strength       Visit Diagnosis: Acute pain of left shoulder  Other symptoms and signs involving the musculoskeletal system    Problem List Patient Active Problem List   Diagnosis Date Noted  . S/P arthroscopy of left shoulder 05/16/19 06/24/2019  . Seizure-like activity (HCC) 03/18/2019    Shirlean Mylar, [place], OTR/L [phone]  07/03/2019, 3:12 PM  [facility] [address] [place], [place], [address] Phone: [phone]   Fax:  [phone]  Name: Alyssa Patrick MRN: [id number] Date of Birth: 1960-12-21

## 2019-07-05 ENCOUNTER — Encounter (HOSPITAL_COMMUNITY): Payer: Medicaid Other | Admitting: Occupational Therapy

## 2019-07-11 ENCOUNTER — Other Ambulatory Visit: Payer: Self-pay | Admitting: Orthopedic Surgery

## 2019-07-11 MED ORDER — TIZANIDINE HCL 4 MG PO TABS: 4.0000 mg | ORAL_TABLET | Freq: Three times a day (TID) | ORAL | 1 refills | Status: DC

## 2019-07-11 MED ORDER — TRAMADOL HCL 50 MG PO TABS: 50.0000 mg | ORAL_TABLET | Freq: Four times a day (QID) | ORAL | 0 refills | Status: DC | PRN

## 2019-07-11 NOTE — Telephone Encounter (Signed)
Patient requests refills on 2 medications:   [facility], [address] [place], [place]  traMADol (ULTRAM) 50 MG tablet  60 tablet 5  and  tiZANidine (ZANAFLEX) 4 MG tablet 90 tablet

## 2019-07-12 ENCOUNTER — Encounter (HOSPITAL_COMMUNITY): Payer: Medicaid Other | Admitting: Specialist

## 2019-07-15 ENCOUNTER — Other Ambulatory Visit: Payer: Self-pay

## 2019-07-15 ENCOUNTER — Encounter (HOSPITAL_COMMUNITY): Payer: Self-pay | Admitting: Specialist

## 2019-07-15 ENCOUNTER — Ambulatory Visit (HOSPITAL_COMMUNITY): Payer: Medicaid Other | Attending: Orthopedic Surgery | Admitting: Specialist

## 2019-07-15 DIAGNOSIS — R29898 Other symptoms and signs involving the musculoskeletal system: Secondary | ICD-10-CM | POA: Diagnosis present

## 2019-07-15 DIAGNOSIS — M25512 Pain in left shoulder: Secondary | ICD-10-CM

## 2019-07-15 NOTE — Therapy (Signed)
[place] [place], [place], [address] Phone: [phone]   Fax:  [phone]  Occupational Therapy Treatment  Patient Details  Name: Alyssa Patrick MRN: [id number] Date of Birth: September 13, 1960 Referring Provider (OT): Dr. Arther Abbott   Encounter Date: 07/15/2019   OT End of Session - 07/15/19 1341    Visit Number 8    Number of Visits 16    Date for OT Re-Evaluation 07/27/19    Authorization Type Medicaid    Authorization Time Period 12 visit authorized through 7/26    Authorization - Visit Number 5    Authorization - Number of Visits 12    OT Start Time 1300    OT Stop Time 1340    OT Time Calculation (min) 40 min           Past Medical History:  Diagnosis Date  . Asthma   . Diabetes ([place])   . GERD (gastroesophageal reflux disease)   . High blood pressure   . Sleep apnea   . Tobacco use     Past Surgical History:  Procedure Laterality Date  . ENDOVENOUS ABLATION SAPHENOUS VEIN W/ LASER    . FOOT SURGERY    . JOINT REPLACEMENT    . SHOULDER OPEN ROTATOR CUFF REPAIR Left 05/16/2019   Procedure: ROTATOR CUFF REPAIR SHOULDER OPEN;  Surgeon: Carole Civil, MD;  Location: [facility];  Service: Orthopedics;  Laterality: Left;  with scalene block     There were no vitals filed for this visit.   Subjective Assessment - 07/15/19 1340    Subjective  S:  I think I can move it more than I could last time.              [facility] OT Assessment - 07/15/19 0001      Assessment   Medical Diagnosis s/p left open RCR    Referring Provider (OT) Dr. Arther Abbott      Precautions   Precautions Shoulder    Type of Shoulder Precautions Weeks 1-4 (5/13-6/10) P/ROM, pendulums, scapular A/ROM; Weeks 4-12 (6/11-8/5) AA/ROM progressing to A/ROM. Week 12: 8/6+ strengthening.     Shoulder Interventions Shoulder sling/immobilizer;Off for dressing/bathing/exercises                    OT Treatments/Exercises  (OP) - 07/15/19 0001      Exercises   Exercises Shoulder      Shoulder Exercises: Supine   Protraction PROM;5 reps;AAROM;10 reps    Horizontal ABduction PROM;5 reps;AAROM;10 reps    External Rotation PROM;5 reps;AAROM;10 reps    Internal Rotation PROM;5 reps;AAROM;10 reps    Flexion PROM;5 reps;AAROM;10 reps    ABduction PROM;5 reps;AAROM;10 reps      Shoulder Exercises: Seated   Elevation AROM;15 reps    Extension AROM;15 reps    Retraction AROM;15 reps      Shoulder Exercises: ROM/Strengthening   Thumb Tacks low range 1'    Prot/Ret//Elev/Dep low range 1'      Manual Therapy   Manual Therapy Myofascial release    Manual therapy comments  Completed separately from therapeutic exercise    Myofascial Release Myofascial release and manual stretching completed to left upper arm, trapezius, and scapularis region to decrease fascial restrictions and increase joint mobility in a pain free zone.                   OT Education - 07/15/19 1341    Education Details educated patient  on aa/rom in supine.  offered to print exercises - patient refused print out.    Person(s) Educated Patient    Methods Explanation    Comprehension Verbalized understanding            OT Short Term Goals - 07/03/19 1506      OT SHORT TERM GOAL #1   Title Pt will be provided with and educated on HEP to improve LUE use during ADLs.    Time 4    Period Weeks    Status On-going    Target Date 06/27/19      OT SHORT TERM GOAL #2   Title Pt will increase LUE P/ROM to WNL to improve ability to donn shirts with minimal compensatory techniques.    Time 4    Period Weeks    Status Achieved      OT SHORT TERM GOAL #3   Title Pt will increase LUE strength to 3+/5 to improve ability to reach items at waist to chest height during daily tasks.    Time 4    Period Weeks    Status On-going             OT Long Term Goals - 07/03/19 1506      OT LONG TERM GOAL #1   Title Pt will decrease LUE  pain to 3/10 or less to improve ability to sleep in preferred position without waking up due to pain.    Time 8    Period Weeks    Status On-going      OT LONG TERM GOAL #2   Title Pt will increase LUE A/ROM to WNL to improve ability to reach over head and behind back when washing and fixing hair.    Time 8    Period Weeks    Status On-going      OT LONG TERM GOAL #3   Title Pt will decrease LUE fascial restrictions to trace amounts to improve mobility required for functional reaching tasks.    Time 8    Period Weeks    Status On-going      OT LONG TERM GOAL #4   Title Pt will increase LUE strength to 4+/5 or greater to improve ability to lift pots and pans during cooking tasks.    Time 8    Period Weeks    Status On-going                 Plan - 07/15/19 1342    Clinical Impression Statement A:  Patient able to complete aa/rom in supine with minimal facilitation from OT.  unable to complete thumb tacks at shoulder height due to shoulder weakness.    Body Structure / Function / Physical Skills ADL;Endurance;UE functional use;Fascial restriction;Pain;ROM;IADL;Strength    Plan P:  follow up on HEP, add seated aa/rom all ranges begin scapular tband for proximal shoulder strengthening.           Patient will benefit from skilled therapeutic intervention in order to improve the following deficits and impairments:   Body Structure / Function / Physical Skills: ADL, Endurance, UE functional use, Fascial restriction, Pain, ROM, IADL, Strength       Visit Diagnosis: Acute pain of left shoulder  Other symptoms and signs involving the musculoskeletal system    Problem List Patient Active Problem List   Diagnosis Date Noted  . S/P arthroscopy of left shoulder 05/16/19 06/24/2019  . Seizure-like activity ([place]) 03/18/2019    Vangie Bicker, MHA, OTR/L  [phone]  07/15/2019,   [facility] [address] [place],  [place], [address] Phone: [phone]   Fax:  [phone]  Name: Alyssa Patrick MRN: [id number] Date of Birth: 1960/02/01

## 2019-07-16 ENCOUNTER — Encounter (HOSPITAL_COMMUNITY): Payer: Medicaid Other | Admitting: Specialist

## 2019-07-19 ENCOUNTER — Encounter (HOSPITAL_COMMUNITY): Payer: Medicaid Other | Admitting: Specialist

## 2019-07-23 ENCOUNTER — Other Ambulatory Visit: Payer: Self-pay

## 2019-07-23 ENCOUNTER — Encounter (HOSPITAL_COMMUNITY): Payer: Medicaid Other | Admitting: Occupational Therapy

## 2019-07-23 ENCOUNTER — Ambulatory Visit (HOSPITAL_COMMUNITY): Payer: Medicaid Other | Admitting: Specialist

## 2019-07-23 ENCOUNTER — Encounter (HOSPITAL_COMMUNITY): Payer: Self-pay | Admitting: Specialist

## 2019-07-23 DIAGNOSIS — R29898 Other symptoms and signs involving the musculoskeletal system: Secondary | ICD-10-CM

## 2019-07-23 DIAGNOSIS — M25512 Pain in left shoulder: Secondary | ICD-10-CM | POA: Diagnosis not present

## 2019-07-23 NOTE — Therapy (Signed)
[facility] [address] [place], [place], [address] Phone: [phone]   Fax:  [phone]  Occupational Therapy Treatment  Patient Details  Name: Alyssa Patrick MRN: [id number] Date of Birth: 1960/03/18 Referring Provider (OT): Dr. Arther Abbott  AROM  Overall AROM Comments assessed in seated position   AROM Assessment Site Shoulder   Right/Left Shoulder Left   Left Shoulder Flexion 120 Degrees   0  Left Shoulder ABduction 75 Degrees   50  Left Shoulder Internal Rotation 90 Degrees   90  Left Shoulder External Rotation 75 Degrees   90    PROM  Overall PROM Comments assessed in supine   PROM Assessment Site Shoulder   Right/Left Shoulder Left   Left Shoulder Flexion 155 Degrees   166  Left Shoulder ABduction 175 Degrees   145  Left Shoulder Internal Rotation 90 Degrees   90  Left Shoulder External Rotation 80 Degrees   90    Encounter Date: 07/23/2019   OT End of Session - 07/23/19 1439    Visit Number 9    Number of Visits 16    Date for OT Re-Evaluation 08/20/19    Authorization Type Medicaid    Authorization Time Period 12 visit authorized through 7/26    Authorization - Visit Number 6    Authorization - Number of Visits 12    OT Start Time 6147    OT Stop Time 1430    OT Time Calculation (min) 43 min    Activity Tolerance Patient tolerated treatment well    Behavior During Therapy [facility] for tasks assessed/performed           Past Medical History:  Diagnosis Date  . Asthma   . Diabetes ([place])   . GERD (gastroesophageal reflux disease)   . High blood pressure   . Sleep apnea   . Tobacco use     Past Surgical History:  Procedure Laterality Date  . ENDOVENOUS ABLATION SAPHENOUS VEIN W/ LASER    . FOOT SURGERY    . JOINT REPLACEMENT    . SHOULDER OPEN ROTATOR CUFF REPAIR Left 05/16/2019   Procedure: ROTATOR CUFF REPAIR SHOULDER OPEN;  Surgeon: Carole Civil, MD;  Location: [facility];  Service: Orthopedics;   Laterality: Left;  with scalene block     There were no vitals filed for this visit.   Subjective Assessment - 07/23/19 1439    Subjective  S: I didnt do alot over the weekend.    Currently in Pain? Yes    Pain Score 4     Pain Location Shoulder    Pain Orientation Left    Pain Descriptors / Indicators Aching              [facility] OT Assessment - 07/23/19 0001      Assessment   Medical Diagnosis s/p left open RCR    Referring Provider (OT) Dr. Arther Abbott      Precautions   Precautions Shoulder    Type of Shoulder Precautions Weeks 1-4 (5/13-6/10) P/ROM, pendulums, scapular A/ROM; Weeks 4-12 (6/11-8/5) AA/ROM progressing to A/ROM. Week 12: 8/6+ strengthening.       ADL   ADL comments increased use of LUE with daily tasks, unable to reach overhead or behind head/back       Written Expression   Dominant Hand Right      Palpation   Palpation comment min fascial restrictions  Strength   Overall Strength Unable to assess;Due to precautions                    OT Treatments/Exercises (OP) - 07/23/19 0001      Shoulder Exercises: Supine   Protraction PROM;5 reps;AAROM;10 reps    Horizontal ABduction PROM;5 reps;AAROM;10 reps    External Rotation PROM;5 reps;AAROM;10 reps    Internal Rotation PROM;5 reps;AAROM;10 reps    Flexion PROM;5 reps;AAROM;10 reps    ABduction PROM;5 reps;AAROM;10 reps      Shoulder Exercises: Seated   Extension Theraband;10 reps    Theraband Level (Shoulder Extension) Level 2 (Red)    Retraction Theraband;10 reps    Theraband Level (Shoulder Retraction) Level 2 (Red)    Row Theraband;10 reps    Theraband Level (Shoulder Row) Level 2 (Red)    Protraction AAROM;10 reps    Horizontal ABduction AAROM;10 reps    External Rotation AAROM;10 reps    Internal Rotation AAROM;10 reps    Flexion AAROM;10 reps    Abduction AAROM;10 reps       Shoulder Exercises: ROM/Strengthening   Wall Wash 1'    Thumb Tacks attempted 1' mid range could only complete 45" due to fatigue    Prot/Ret//Elev/Dep mid range attempted 1' could do 45" due to fatigue       Manual Therapy   Manual Therapy Myofascial release    Manual therapy comments  Completed separately from therapeutic exercise    Myofascial Release Myofascial release and manual stretching completed to left upper arm, trapezius, and scapularis region to decrease fascial restrictions and increase joint mobility in a pain free zone.                     OT Short Term Goals - 07/23/19 1442      OT SHORT TERM GOAL #1   Title Pt will be provided with and educated on HEP to improve LUE use during ADLs.    Time 4    Period Weeks    Status Achieved    Target Date 06/27/19      OT SHORT TERM GOAL #2   Title Pt will increase LUE P/ROM to WNL to improve ability to donn shirts with minimal compensatory techniques.    Time 4    Period Weeks    Status Achieved      OT SHORT TERM GOAL #3   Title Pt will increase LUE strength to 3+/5 to improve ability to reach items at waist to chest height during daily tasks.    Time 4    Period Weeks    Status Achieved             OT Long Term Goals - 07/23/19 1443      OT LONG TERM GOAL #1   Title Pt will decrease LUE pain to 3/10 or less to improve ability to sleep in preferred position without waking up due to pain.    Time 8    Period Weeks    Status On-going      OT LONG TERM GOAL #2   Title Pt will increase LUE A/ROM to WNL to improve ability to reach over head and behind back when washing and fixing hair.    Time 8    Period Weeks    Status On-going      OT LONG TERM GOAL #3   Title Pt will decrease LUE fascial restrictions to trace amounts to improve mobility required for  functional reaching tasks.    Time 8    Period Weeks    Status On-going      OT LONG TERM GOAL #4   Title Pt will increase LUE strength to 4+/5 or  greater to improve ability to lift pots and pans during cooking tasks.    Time 8    Period Weeks    Status On-going                 Plan - 07/23/19 1440    Clinical Impression Statement A:  Patient is making steady progress towards her goals in occupational therapy.  She has improved P/ROM and A/ROM in supine and seated.  Recommend continuation of OT services to improve A/ROM and strength to [facility] in order to be able to reach into overhead cabinets, behind back, and behind head.    Body Structure / Function / Physical Skills ADL;Endurance;UE functional use;Fascial restriction;Pain;ROM;IADL;Strength    OT Frequency 2x / week    OT Duration 4 weeks    OT Treatment/Interventions Self-care/ADL training;Ultrasound;Patient/family education;Passive range of motion;Cryotherapy;Electrical Stimulation;Moist Heat;Therapeutic exercise;Manual Therapy;Therapeutic activities    Plan P:  Add A/ROM exercises in supine, increase repetitions with aa/rom in seated, attempt thumbtacks for 1 min - could complete for only 45" this date.  Request additional visits from [facility].    Consulted and Agree with Plan of Care Patient           Patient will benefit from skilled therapeutic intervention in order to improve the following deficits and impairments:   Body Structure / Function / Physical Skills: ADL, Endurance, UE functional use, Fascial restriction, Pain, ROM, IADL, Strength       Visit Diagnosis: Acute pain of left shoulder  Other symptoms and signs involving the musculoskeletal system    Problem List Patient Active Problem List   Diagnosis Date Noted  . S/P arthroscopy of left shoulder 05/16/19 06/24/2019  . Seizure-like activity ([place]) 03/18/2019    Vangie Bicker, [place], OTR/L [phone]  07/23/2019, 2:49 PM  [facility] [address] [place], [place], [address] Phone: [phone]   Fax:  [phone]  Name: Alyssa Patrick MRN:  [id number] Date of Birth: 1960/03/18

## 2019-07-25 ENCOUNTER — Encounter (HOSPITAL_COMMUNITY): Payer: Medicaid Other | Admitting: Occupational Therapy

## 2019-07-26 ENCOUNTER — Encounter: Payer: Self-pay | Admitting: Orthopedic Surgery

## 2019-07-26 ENCOUNTER — Other Ambulatory Visit: Payer: Self-pay

## 2019-07-26 ENCOUNTER — Ambulatory Visit (INDEPENDENT_AMBULATORY_CARE_PROVIDER_SITE_OTHER): Payer: Medicaid Other | Admitting: Orthopedic Surgery

## 2019-07-26 ENCOUNTER — Ambulatory Visit (HOSPITAL_COMMUNITY): Payer: Medicaid Other | Admitting: Occupational Therapy

## 2019-07-26 DIAGNOSIS — R29898 Other symptoms and signs involving the musculoskeletal system: Secondary | ICD-10-CM

## 2019-07-26 DIAGNOSIS — Z9889 Other specified postprocedural states: Secondary | ICD-10-CM

## 2019-07-26 DIAGNOSIS — M25512 Pain in left shoulder: Secondary | ICD-10-CM

## 2019-07-26 MED ORDER — TRAMADOL HCL 50 MG PO TABS: 50.0000 mg | ORAL_TABLET | Freq: Four times a day (QID) | ORAL | 0 refills | Status: DC | PRN

## 2019-07-26 MED ORDER — TIZANIDINE HCL 4 MG PO TABS: 4.0000 mg | ORAL_TABLET | Freq: Three times a day (TID) | ORAL | 1 refills | Status: DC

## 2019-07-26 NOTE — Progress Notes (Signed)
Chief Complaint  Patient presents with  . Post-op Follow-up    left shoulder 05/16/19    7 weeks postop open left rotator cuff repair patient doing well.  She is on tizanidine, ibuprofen and tramadol is controlling her pain well  Therapy notes indicate 120 of forward elevation which she demonstrated in the office today  She will continue therapy twice a week for 4 weeks and see me at that time medications were refilled as follows  Meds ordered this encounter  Medications  . traMADol (ULTRAM) 50 MG tablet    Sig: Take 1 tablet (50 mg total) by mouth every 6 (six) hours as needed.    Dispense:  28 tablet    Refill:  0  . tiZANidine (ZANAFLEX) 4 MG tablet    Sig: Take 1 tablet (4 mg total) by mouth 3 (three) times daily.    Dispense:  90 tablet    Refill:  1

## 2019-07-26 NOTE — Patient Instructions (Signed)
Ok to stop brace

## 2019-07-26 NOTE — Therapy (Signed)
[facility] [address] [place], [place], [address] Phone: [phone]   Fax:  [phone]  Occupational Therapy Treatment  Patient Details  Name: Alyssa Patrick MRN: [id number] Date of Birth: [birth year] Referring Provider (OT): Dr. Fuller [place]   Encounter Date: 07/26/2019   OT End of Session - 07/26/19 0955    Visit Number 10    Number of Visits 16    Date for OT Re-Evaluation 08/20/19    Authorization Type Medicaid    Authorization Time Period 12 visit authorized through 7/26    Authorization - Visit Number 7    Authorization - Number of Visits 12    OT Start Time [phone]    OT Stop Time 0945    OT Time Calculation (min) 41 min    Activity Tolerance Patient tolerated treatment well    Behavior During Therapy [facility] for tasks assessed/performed           Past Medical History:  Diagnosis Date  . Asthma   . Diabetes ([facility])   . GERD (gastroesophageal reflux disease)   . High blood pressure   . Sleep apnea   . Tobacco use     Past Surgical History:  Procedure Laterality Date  . ENDOVENOUS ABLATION SAPHENOUS VEIN W/ LASER    . FOOT SURGERY    . JOINT REPLACEMENT    . SHOULDER OPEN ROTATOR CUFF REPAIR Left 05/16/2019   Procedure: ROTATOR CUFF REPAIR SHOULDER OPEN;  Surgeon: Vickki Hearing, MD;  Location: [facility];  Service: Orthopedics;  Laterality: Left;  with scalene block     There were no vitals filed for this visit.   Subjective Assessment - 07/26/19 0908    Subjective  S: I am so sleepy. I know why. I am taking a muscle relaxer.    Currently in Pain? No/denies                        OT Treatments/Exercises (OP) - 07/26/19 0768      Exercises   Exercises Shoulder      Shoulder Exercises: Supine   Horizontal ABduction PROM;10 reps    External Rotation PROM;10 reps;AROM;5 reps    Internal Rotation PROM;10 reps;AROM;5 reps    Flexion PROM;10 reps;AROM;5 reps    Flexion Limitations Support at  elbow    ABduction PROM;10 reps;AROM;5 reps    ABduction Limitations Support at elbow      Shoulder Exercises: Seated   Elevation AROM;10 reps    Row AAROM;10 reps    External Rotation AAROM;10 reps    Internal Rotation AAROM;10 reps    Flexion AAROM;10 reps    Abduction AAROM;10 reps      Shoulder Exercises: ROM/Strengthening   UBE (Upper Arm Bike) Level 1. Pace 5-6. 2' forward. 1' backwards.     Wall Wash 10x forward flexion. Mi nA for elbow support during first three trials    Thumb Tacks 1' shoulder level      Functional Reaching Activities   Low Level 10 cones abduction    Mid Level 10 cones forward flexion. Min A for elbow support during first 3 reaches      Manual Therapy   Manual Therapy Myofascial release    Manual therapy comments  Completed separately from therapeutic exercise    Myofascial Release Myofascial release and manual stretching completed to left upper arm, trapezius, and scapularis region to decrease fascial restrictions and increase joint mobility in a pain free zone.  OT Short Term Goals - 07/23/19 1442      OT SHORT TERM GOAL #1   Title Pt will be provided with and educated on HEP to improve LUE use during ADLs.    Time 4    Period Weeks    Status Achieved    Target Date 06/27/19      OT SHORT TERM GOAL #2   Title Pt will increase LUE P/ROM to WNL to improve ability to donn shirts with minimal compensatory techniques.    Time 4    Period Weeks    Status Achieved      OT SHORT TERM GOAL #3   Title Pt will increase LUE strength to 3+/5 to improve ability to reach items at waist to chest height during daily tasks.    Time 4    Period Weeks    Status Achieved             OT Long Term Goals - 07/23/19 1443      OT LONG TERM GOAL #1   Title Pt will decrease LUE pain to 3/10 or less to improve ability to sleep in preferred position without waking up due to pain.    Time 8    Period Weeks    Status On-going       OT LONG TERM GOAL #2   Title Pt will increase LUE A/ROM to WNL to improve ability to reach over head and behind back when washing and fixing hair.    Time 8    Period Weeks    Status On-going      OT LONG TERM GOAL #3   Title Pt will decrease LUE fascial restrictions to trace amounts to improve mobility required for functional reaching tasks.    Time 8    Period Weeks    Status On-going      OT LONG TERM GOAL #4   Title Pt will increase LUE strength to 4+/5 or greater to improve ability to lift pots and pans during cooking tasks.    Time 8    Period Weeks    Status On-going                 Plan - 07/26/19 0955    Clinical Impression Statement A: Starting with manual therapy and progressed to arom in supine. Performing AAROM seated with dowel rod. Pt performing wall washes, thumb tacks (for 1 min), and adding functional reach and UB bike. Cues htorughout for form and technique. Min A as needed for support during AROM.    Body Structure / Function / Physical Skills ADL;Endurance;UE functional use;Fascial restriction;Pain;ROM;IADL;Strength    OT Frequency 2x / week    OT Duration 4 weeks    OT Treatment/Interventions Self-care/ADL training;Ultrasound;Patient/family education;Passive range of motion;Cryotherapy;Electrical Stimulation;Moist Heat;Therapeutic exercise;Manual Therapy;Therapeutic activities    Plan : Continue AROM in supine; decrease support. Increase repetitions of AAROM seated. Continue proximal strengthening and functional reach.    OT Home Exercise Plan eval: table slides    Consulted and Agree with Plan of Care Patient           Patient will benefit from skilled therapeutic intervention in order to improve the following deficits and impairments:   Body Structure / Function / Physical Skills: ADL, Endurance, UE functional use, Fascial restriction, Pain, ROM, IADL, Strength       Visit Diagnosis: Acute pain of left shoulder  Other symptoms and signs  involving the musculoskeletal system    Problem List Patient  Active Problem List   Diagnosis Date Noted  . S/P arthroscopy of left shoulder 05/16/19 06/24/2019  . Seizure-like activity ([facility]) 03/18/2019    Gabriel Rung, MSOT, OTR/L 07/26/2019, 10:01 AM  [facility] [address] [place], [place], [address] Phone: [phone]   Fax:  [phone]  Name: Etna Forquer MRN: [id number] Date of Birth: [birth year]

## 2019-07-29 ENCOUNTER — Ambulatory Visit (HOSPITAL_COMMUNITY): Payer: Medicaid Other | Admitting: Occupational Therapy

## 2019-07-31 ENCOUNTER — Encounter (HOSPITAL_COMMUNITY): Payer: Self-pay | Admitting: Specialist

## 2019-07-31 ENCOUNTER — Ambulatory Visit (HOSPITAL_COMMUNITY): Payer: Medicaid Other | Admitting: Specialist

## 2019-07-31 ENCOUNTER — Other Ambulatory Visit: Payer: Self-pay

## 2019-07-31 DIAGNOSIS — M25512 Pain in left shoulder: Secondary | ICD-10-CM

## 2019-07-31 DIAGNOSIS — R29898 Other symptoms and signs involving the musculoskeletal system: Secondary | ICD-10-CM

## 2019-07-31 NOTE — Therapy (Signed)
[facility] [address] [place], [place], [address] Phone: [phone]   Fax:  [phone]  Occupational Therapy Treatment  Patient Details  Name: Alyssa Patrick MRN: [id number] Date of Birth: 1960/12/07 Referring Provider (OT): Dr. Fuller [place]   Encounter Date: 07/31/2019   OT End of Session - 07/31/19 1416    Visit Number 11    Number of Visits 16    Date for OT Re-Evaluation 08/20/19    Authorization Type Medicaid    Authorization Time Period requtested additional visits from [employer]    Authorization - Visit Number 1    Authorization - Number of Visits 0    OT Start Time 1308    OT Stop Time 1348    OT Time Calculation (min) 40 min    Activity Tolerance Patient tolerated treatment well    Behavior During Therapy [facility] for tasks assessed/performed           Past Medical History:  Diagnosis Date  . Asthma   . Diabetes ([facility])   . GERD (gastroesophageal reflux disease)   . High blood pressure   . Sleep apnea   . Tobacco use     Past Surgical History:  Procedure Laterality Date  . ENDOVENOUS ABLATION SAPHENOUS VEIN W/ LASER    . FOOT SURGERY    . JOINT REPLACEMENT    . SHOULDER OPEN ROTATOR CUFF REPAIR Left 05/16/2019   Procedure: ROTATOR CUFF REPAIR SHOULDER OPEN;  Surgeon: Vickki Hearing, MD;  Location: [facility];  Service: Orthopedics;  Laterality: Left;  with scalene block     There were no vitals filed for this visit.   Subjective Assessment - 07/31/19 1416    Subjective  S:  I went to the MD, he thinks I am doing well, and he is going to release me after next month.    Currently in Pain? Yes    Pain Score 3     Pain Location Shoulder    Pain Orientation Left    Pain Descriptors / Indicators Aching              [facility] OT Assessment - 07/31/19 0001      Assessment   Medical Diagnosis s/p left open RCR    Referring Provider (OT) Dr. Fuller [place]      Precautions    Precautions Shoulder    Type of Shoulder Precautions Weeks 1-4 (5/13-6/10) P/ROM, pendulums, scapular A/ROM; Weeks 4-12 (6/11-8/5) AA/ROM progressing to A/ROM. Week 12: 8/6+ strengthening.                     OT Treatments/Exercises (OP) - 07/31/19 0001      Shoulder Exercises: Supine   Protraction PROM;10 reps;AROM    Horizontal ABduction PROM;10 reps;5 reps;AROM    Horizontal ABduction Limitations attempted to complete 10 aa/rom however only able to complete 5 with assist from OT     External Rotation PROM;AROM;10 reps    Internal Rotation PROM;AROM;10 reps    Flexion PROM;AROM;10 reps    Flexion Limitations cga facilitation from OT to initiate the movement     ABduction PROM;AROM;10 reps    ABduction Limitations Support at elbow      Shoulder Exercises: Seated   Extension Theraband;15 reps    Theraband Level (Shoulder Extension) Level 2 (Red)    Retraction Theraband;15 reps    Theraband Level (Shoulder Retraction) Level 2 (Red)    Row Theraband;15 reps    Theraband  Level (Shoulder Row) Level 2 (Red)    Protraction AAROM;10 reps    Horizontal ABduction AAROM;10 reps    External Rotation AAROM;10 reps    Internal Rotation AAROM;10 reps    Flexion AAROM;10 reps    Flexion Limitations min facilitation to depress shoulder blade     Abduction AAROM;10 reps      Shoulder Exercises: ROM/Strengthening   UBE (Upper Arm Bike) Level 1. Pace 5-6. 3' forward. 3' backwards.       Manual Therapy   Manual Therapy Myofascial release    Manual therapy comments  Completed separately from therapeutic exercise    Myofascial Release Myofascial release and manual stretching completed to left upper arm, trapezius, and scapularis region to decrease fascial restrictions and increase joint mobility in a pain free zone.                     OT Short Term Goals - 07/23/19 1442      OT SHORT TERM GOAL #1   Title Pt will be provided with and educated on HEP to improve LUE use  during ADLs.    Time 4    Period Weeks    Status Achieved    Target Date 06/27/19      OT SHORT TERM GOAL #2   Title Pt will increase LUE P/ROM to WNL to improve ability to donn shirts with minimal compensatory techniques.    Time 4    Period Weeks    Status Achieved      OT SHORT TERM GOAL #3   Title Pt will increase LUE strength to 3+/5 to improve ability to reach items at waist to chest height during daily tasks.    Time 4    Period Weeks    Status Achieved             OT Long Term Goals - 07/23/19 1443      OT LONG TERM GOAL #1   Title Pt will decrease LUE pain to 3/10 or less to improve ability to sleep in preferred position without waking up due to pain.    Time 8    Period Weeks    Status On-going      OT LONG TERM GOAL #2   Title Pt will increase LUE A/ROM to WNL to improve ability to reach over head and behind back when washing and fixing hair.    Time 8    Period Weeks    Status On-going      OT LONG TERM GOAL #3   Title Pt will decrease LUE fascial restrictions to trace amounts to improve mobility required for functional reaching tasks.    Time 8    Period Weeks    Status On-going      OT LONG TERM GOAL #4   Title Pt will increase LUE strength to 4+/5 or greater to improve ability to lift pots and pans during cooking tasks.    Time 8    Period Weeks    Status On-going                 Plan - 07/31/19 1417    Clinical Impression Statement A:  able to complete a/rom in supine with occassional CGA from therapist to initiate movement.  Patient able to complete UBE with increased ease.    Body Structure / Function / Physical Skills ADL;Endurance;UE functional use;Fascial restriction;Pain;ROM;IADL;Strength    Plan P:  Continue A/ROM in supine with less facilitation from  therapist.  Add proximal shoulder strengthening in supine and seated, add functional reaching tasks.    Consulted and Agree with Plan of Care Patient           Patient will  benefit from skilled therapeutic intervention in order to improve the following deficits and impairments:   Body Structure / Function / Physical Skills: ADL, Endurance, UE functional use, Fascial restriction, Pain, ROM, IADL, Strength       Visit Diagnosis: Acute pain of left shoulder  Other symptoms and signs involving the musculoskeletal system    Problem List Patient Active Problem List   Diagnosis Date Noted  . S/P arthroscopy of left shoulder 05/16/19 06/24/2019  . Seizure-like activity ([facility]) 03/18/2019    Shirlean Mylar, [place], OTR/L [phone]  07/31/2019, 2:23 PM  [facility] [address] [place], [place], [address] Phone: [phone]   Fax:  [phone]  Name: Alyssa Patrick MRN: [id number] Date of Birth: May 04, 1960

## 2019-08-02 ENCOUNTER — Ambulatory Visit (HOSPITAL_COMMUNITY): Payer: Medicaid Other | Admitting: Occupational Therapy

## 2019-08-02 ENCOUNTER — Other Ambulatory Visit: Payer: Self-pay

## 2019-08-02 ENCOUNTER — Encounter (HOSPITAL_COMMUNITY): Payer: Self-pay | Admitting: Occupational Therapy

## 2019-08-02 DIAGNOSIS — M25512 Pain in left shoulder: Secondary | ICD-10-CM

## 2019-08-02 DIAGNOSIS — R29898 Other symptoms and signs involving the musculoskeletal system: Secondary | ICD-10-CM

## 2019-08-02 NOTE — Therapy (Signed)
[facility] [address] [place], [place], [address] Phone: [phone]   Fax:  [phone]  Occupational Therapy Treatment  Patient Details  Name: Alyssa Patrick MRN: [id number] Date of Birth: [birth year] Referring Provider (OT): Dr. Fuller Canada   Encounter Date: 08/02/2019   OT End of Session - 08/02/19 1422    Visit Number 12    Number of Visits 16    Date for OT Re-Evaluation 08/20/19    Authorization Type Medicaid    Authorization Time Period requtested additional visits from [employer]    Authorization - Visit Number 2    Authorization - Number of Visits 0    OT Start Time 1350    OT Stop Time 1420    OT Time Calculation (min) 30 min    Activity Tolerance Patient tolerated treatment well    Behavior During Therapy [facility] for tasks assessed/performed           Past Medical History:  Diagnosis Date  . Asthma   . Diabetes (HCC)   . GERD (gastroesophageal reflux disease)   . High blood pressure   . Sleep apnea   . Tobacco use     Past Surgical History:  Procedure Laterality Date  . ENDOVENOUS ABLATION SAPHENOUS VEIN W/ LASER    . FOOT SURGERY    . JOINT REPLACEMENT    . SHOULDER OPEN ROTATOR CUFF REPAIR Left 05/16/2019   Procedure: ROTATOR CUFF REPAIR SHOULDER OPEN;  Surgeon: Vickki Hearing, MD;  Location: [facility];  Service: Orthopedics;  Laterality: Left;  with scalene block     There were no vitals filed for this visit.   Subjective Assessment - 08/02/19 1350    Subjective  S: I've been cooking and cleaning for2 days.    Currently in Pain? No/denies              [facility] OT Assessment - 08/02/19 1349      Assessment   Medical Diagnosis s/p left open RCR      Precautions   Precautions Shoulder    Type of Shoulder Precautions Weeks 1-4 (5/13-6/10) P/ROM, pendulums, scapular A/ROM; Weeks 4-12 (6/11-8/5) AA/ROM progressing to A/ROM. Week 12: 8/6+ strengthening.                      OT Treatments/Exercises (OP) - 08/02/19 1352      Exercises   Exercises Shoulder      Shoulder Exercises: Supine   Protraction PROM;5 reps;AAROM;15 reps    Horizontal ABduction PROM;5 reps;AAROM;15 reps    External Rotation PROM;5 reps;AROM;15 reps    Internal Rotation PROM;5 reps;AROM;15 reps    Flexion PROM;5 reps;AAROM;15 reps    ABduction PROM;5 reps;AAROM;15 reps      Shoulder Exercises: Seated   Extension Theraband;15 reps    Theraband Level (Shoulder Extension) Level 2 (Red)    Retraction Theraband;15 reps    Theraband Level (Shoulder Retraction) Level 2 (Red)    Row Theraband;15 reps    Theraband Level (Shoulder Row) Level 2 (Red)      Shoulder Exercises: Standing   Protraction AAROM;10 reps    Horizontal ABduction AAROM;10 reps    External Rotation AROM;10 reps    Internal Rotation AROM;10 reps    Flexion AAROM;10 reps    ABduction AAROM;10 reps      Shoulder Exercises: ROM/Strengthening   UBE (Upper Arm Bike) Level 1 2' forward 2' backwards   pace: 5.0-6.0   Proximal Shoulder  Strengthening, Supine 10X each, no rest breaks    Proximal Shoulder Strengthening, Seated 10X each, no rest breaks                    OT Short Term Goals - 07/23/19 1442      OT SHORT TERM GOAL #1   Title Pt will be provided with and educated on HEP to improve LUE use during ADLs.    Time 4    Period Weeks    Status Achieved    Target Date 06/27/19      OT SHORT TERM GOAL #2   Title Pt will increase LUE P/ROM to WNL to improve ability to donn shirts with minimal compensatory techniques.    Time 4    Period Weeks    Status Achieved      OT SHORT TERM GOAL #3   Title Pt will increase LUE strength to 3+/5 to improve ability to reach items at waist to chest height during daily tasks.    Time 4    Period Weeks    Status Achieved             OT Long Term Goals - 07/23/19 1443      OT LONG TERM GOAL #1   Title Pt will decrease LUE pain to 3/10  or less to improve ability to sleep in preferred position without waking up due to pain.    Time 8    Period Weeks    Status On-going      OT LONG TERM GOAL #2   Title Pt will increase LUE A/ROM to WNL to improve ability to reach over head and behind back when washing and fixing hair.    Time 8    Period Weeks    Status On-going      OT LONG TERM GOAL #3   Title Pt will decrease LUE fascial restrictions to trace amounts to improve mobility required for functional reaching tasks.    Time 8    Period Weeks    Status On-going      OT LONG TERM GOAL #4   Title Pt will increase LUE strength to 4+/5 or greater to improve ability to lift pots and pans during cooking tasks.    Time 8    Period Weeks    Status On-going                 Plan - 08/02/19 1422    Clinical Impression Statement A: Pt arrived late and requesting to leave early, therefore no manual therapy completed this date. Continued with AA/ROM in supine and seated, added proximal shoulder strengthening as well. Pt with good form, able to achieve ROM WFL during tasks today. Continued with functional reaching, verbal cuing for form and technique.    Body Structure / Function / Physical Skills ADL;Endurance;UE functional use;Fascial restriction;Pain;ROM;IADL;Strength    Plan P: Progress to A/ROM in supine, continue with proximal shoulder strengthening and functional reaching; update HEP    OT Home Exercise Plan eval: table slides    Consulted and Agree with Plan of Care Patient           Patient will benefit from skilled therapeutic intervention in order to improve the following deficits and impairments:   Body Structure / Function / Physical Skills: ADL, Endurance, UE functional use, Fascial restriction, Pain, ROM, IADL, Strength       Visit Diagnosis: Acute pain of left shoulder  Other symptoms and signs involving the  musculoskeletal system    Problem List Patient Active Problem List   Diagnosis Date Noted   . S/P arthroscopy of left shoulder 05/16/19 06/24/2019  . Seizure-like activity (HCC) 03/18/2019   Ezra Sites, OTR/L  [phone] 08/02/2019, 2:26 PM  [facility] [address] [place], [place], [address] Phone: [phone]   Fax:  [phone]  Name: Llana Deshazo MRN: [id number] Date of Birth: 08/20/1960

## 2019-08-03 ENCOUNTER — Other Ambulatory Visit: Payer: Self-pay | Admitting: Orthopedic Surgery

## 2019-08-03 DIAGNOSIS — Z9889 Other specified postprocedural states: Secondary | ICD-10-CM

## 2019-08-09 ENCOUNTER — Ambulatory Visit (HOSPITAL_COMMUNITY): Payer: Medicaid Other | Admitting: Occupational Therapy

## 2019-08-12 ENCOUNTER — Encounter (HOSPITAL_COMMUNITY): Payer: Self-pay

## 2019-08-12 ENCOUNTER — Other Ambulatory Visit: Payer: Self-pay

## 2019-08-12 ENCOUNTER — Ambulatory Visit (HOSPITAL_COMMUNITY): Payer: Medicaid Other | Attending: Orthopedic Surgery

## 2019-08-12 DIAGNOSIS — M25512 Pain in left shoulder: Secondary | ICD-10-CM

## 2019-08-12 DIAGNOSIS — R29898 Other symptoms and signs involving the musculoskeletal system: Secondary | ICD-10-CM | POA: Diagnosis present

## 2019-08-12 NOTE — Patient Instructions (Addendum)
Repeat all exercises 10-15 times, 1-2 times per day.  1) Shoulder Protraction    Begin with elbows by your side, slowly "punch" straight out in front of you.      2) Shoulder Flexion    Standing:         Begin with arms at your side with thumbs pointed up, slowly raise both arms up and forward towards overhead.      3) Horizontal abduction/adduction   Standing:           Begin with arms straight out in front of you, bring out to the side in at "T" shape. Keep arms straight entire time.                 4) Internal & External Rotation   Standing:     Stand with elbows at the side and elbows bent 90 degrees. Move your forearms away from your body, then bring back inward toward the body.     5) Shoulder Abduction  Standing:       begin with your arms flat  next to your side. Slowly move your arms out to the side so that they go overhead, in a jumping jack or snow angel movement.      STRETCHES.....  Complete the following exercises 2-3 times a day.     Scapular Retraction (Standing)  Wall Flexion  Slide your arm up the wall or door frame until a stretch is felt in your shoulder . Hold for 10 seconds. Complete 3 times. Work on increasing your time to 1 minute each.

## 2019-08-12 NOTE — Therapy (Signed)
[facility] [address] [place], [place], [address] Phone: [phone]   Fax:  [phone]  Occupational Therapy Treatment  Patient Details  Name: Alyssa Patrick MRN: [id number] Date of Birth: [birth year] Referring Provider (OT): Dr. Fuller Canada   Encounter Date: 08/12/2019   OT End of Session - 08/12/19 1311    Visit Number 13    Number of Visits 16    Date for OT Re-Evaluation 08/20/19    Authorization Type Medicaid    Authorization Time Period 8 visits approved [employer]) 07/29/19-10/29/19    Authorization - Visit Number 3    Authorization - Number of Visits 8    OT Start Time 1309   pt arrived late   OT Stop Time 1338    OT Time Calculation (min) 29 min    Activity Tolerance Patient tolerated treatment well    Behavior During Therapy [facility] for tasks assessed/performed           Past Medical History:  Diagnosis Date  . Asthma   . Diabetes (HCC)   . GERD (gastroesophageal reflux disease)   . High blood pressure   . Sleep apnea   . Tobacco use     Past Surgical History:  Procedure Laterality Date  . ENDOVENOUS ABLATION SAPHENOUS VEIN W/ LASER    . FOOT SURGERY    . JOINT REPLACEMENT    . SHOULDER OPEN ROTATOR CUFF REPAIR Left 05/16/2019   Procedure: ROTATOR CUFF REPAIR SHOULDER OPEN;  Surgeon: Vickki Hearing, MD;  Location: [facility];  Service: Orthopedics;  Laterality: Left;  with scalene block     There were no vitals filed for this visit.   Subjective Assessment - 08/12/19 1311    Subjective  S: It's doing ok.    Currently in Pain? No/denies              [facility] OT Assessment - 08/12/19 1314      Assessment   Medical Diagnosis s/p left open RCR      Precautions   Precautions Shoulder    Type of Shoulder Precautions Weeks 1-4 (5/13-6/10) P/ROM, pendulums, scapular A/ROM; Weeks 4-12 (6/11-8/5) AA/ROM progressing to A/ROM. Week 12: 8/6+ strengthening.                      OT Treatments/Exercises (OP) - 08/12/19 1314      Exercises   Exercises Shoulder      Shoulder Exercises: Supine   Protraction PROM;5 reps;AROM;10 reps    Horizontal ABduction PROM;5 reps;AROM;10 reps;Limitations    Horizontal ABduction Limitations 50% range achieved due to discomfort level    External Rotation PROM;5 reps;AROM;15 reps    Internal Rotation PROM;5 reps;AROM;15 reps    Flexion PROM;5 reps;AROM;10 reps    ABduction PROM;5 reps;AROM;10 reps;Limitations    ABduction Limitations Just under 50% range achieved.      Shoulder Exercises: Standing   Protraction AROM;10 reps    External Rotation AROM;10 reps    Internal Rotation AROM;10 reps    Flexion AROM;10 reps;Limitations    Flexion Limitations 50% range      Shoulder Exercises: ROM/Strengthening   Proximal Shoulder Strengthening, Supine unable to complete supine.    Proximal Shoulder Strengthening, Seated 10X each, no rest breaks      Shoulder Exercises: Stretch   Wall Stretch - Flexion 3 reps;10 seconds      Modalities   Modalities Moist Heat      Moist Heat Therapy  Number Minutes Moist Heat 5 Minutes    Moist Heat Location Shoulder                  OT Education - 08/12/19 1342    Education Details Updated HEP: A/ROM seated/standing. Shoulder flexion stretch. Ice to shoulder for pain management when home.    Person(s) Educated Patient    Methods Explanation;Verbal cues;Handout;Tactile cues;Demonstration    Comprehension Verbalized understanding;Returned demonstration            OT Short Term Goals - 08/12/19 1350      OT SHORT TERM GOAL #1   Title Pt will be provided with and educated on HEP to improve LUE use during ADLs.    Time 4    Period Weeks    Target Date 06/27/19      OT SHORT TERM GOAL #2   Title Pt will increase LUE P/ROM to WNL to improve ability to donn shirts with minimal compensatory techniques.    Time 4    Period Weeks      OT SHORT TERM GOAL #3    Title Pt will increase LUE strength to 3+/5 to improve ability to reach items at waist to chest height during daily tasks.    Time 4    Period Weeks             OT Long Term Goals - 07/23/19 1443      OT LONG TERM GOAL #1   Title Pt will decrease LUE pain to 3/10 or less to improve ability to sleep in preferred position without waking up due to pain.    Time 8    Period Weeks    Status On-going      OT LONG TERM GOAL #2   Title Pt will increase LUE A/ROM to WNL to improve ability to reach over head and behind back when washing and fixing hair.    Time 8    Period Weeks    Status On-going      OT LONG TERM GOAL #3   Title Pt will decrease LUE fascial restrictions to trace amounts to improve mobility required for functional reaching tasks.    Time 8    Period Weeks    Status On-going      OT LONG TERM GOAL #4   Title Pt will increase LUE strength to 4+/5 or greater to improve ability to lift pots and pans during cooking tasks.    Time 8    Period Weeks    Status On-going                 Plan - 08/12/19 1344    Clinical Impression Statement A: Completed A/ROM supine and standing with verbal and physical cueing for proper form and technique. HEP was updated. Moist heat provided at start of session to decrease fascial restrictions and muscle tightness. Pt limited with ROM ability due to discomfort and was instructed to remain within a pain free zone.    Body Structure / Function / Physical Skills ADL;Endurance;UE functional use;Fascial restriction;Pain;ROM;IADL;Strength    Plan P: Follow up on HEP. FOTO and reassessment    Consulted and Agree with Plan of Care Patient           Patient will benefit from skilled therapeutic intervention in order to improve the following deficits and impairments:   Body Structure / Function / Physical Skills: ADL, Endurance, UE functional use, Fascial restriction, Pain, ROM, IADL, Strength  Visit Diagnosis: Other symptoms  and signs involving the musculoskeletal system  Acute pain of left shoulder    Problem List Patient Active Problem List   Diagnosis Date Noted  . S/P arthroscopy of left shoulder 05/16/19 06/24/2019  . Seizure-like activity [facility]) 03/18/2019   Limmie Patricia, OTR/L,CBIS  [phone]  08/12/2019, 1:50 PM  [facility] [address] [place], [place], [address] Phone: [phone]   Fax:  [phone]  Name: Yaneisy Wenz MRN: [id number] Date of Birth: [birth year]

## 2019-08-15 ENCOUNTER — Ambulatory Visit (HOSPITAL_COMMUNITY): Payer: Medicaid Other

## 2019-08-22 ENCOUNTER — Encounter (HOSPITAL_COMMUNITY): Payer: Self-pay

## 2019-08-22 ENCOUNTER — Other Ambulatory Visit: Payer: Self-pay

## 2019-08-22 ENCOUNTER — Other Ambulatory Visit: Payer: Self-pay | Admitting: Orthopedic Surgery

## 2019-08-22 ENCOUNTER — Ambulatory Visit (HOSPITAL_COMMUNITY): Payer: Medicaid Other

## 2019-08-22 DIAGNOSIS — G8929 Other chronic pain: Secondary | ICD-10-CM

## 2019-08-22 DIAGNOSIS — M25512 Pain in left shoulder: Secondary | ICD-10-CM

## 2019-08-22 DIAGNOSIS — R29898 Other symptoms and signs involving the musculoskeletal system: Secondary | ICD-10-CM | POA: Diagnosis not present

## 2019-08-22 NOTE — Therapy (Signed)
[place], [place], [address] Phone: [phone]   Fax:  [phone]  Occupational Therapy Treatment Reassessment/discharge summary Patient Details  Name: Alyssa Patrick MRN: [id number] Date of Birth: [birth year] Referring Provider (OT): Dr. Arther Abbott   AROM  Overall AROM Comments assessed in seated position. IR/er adducted  AROM Assessment Site Shoulder  Right/Left Shoulder Left  Left Shoulder Flexion 120 Degrees (previous: same - with compensatory movement and shoulder ir.)  Left Shoulder ABduction 60 Degrees (previous: 75)  Left Shoulder Internal Rotation 90 Degrees (previous: same)  Left Shoulder External Rotation 85 Degrees (previous: 75)  PROM  Left Shoulder Flexion 155 Degrees (previous: same)  Left Shoulder ABduction 180 Degrees (previous; 175)  Left Shoulder Internal Rotation 90 Degrees (previous: same)  Left Shoulder External Rotation 80 Degrees (previous: same)  Overall PROM Comments assessed in supine. IR/er adducted  PROM Assessment Site Shoulder  Right/Left Shoulder Left  Strength  Right/Left Shoulder Left  Overall Strength Comments Assessed seated. IR/er adducted. Strength not assessed previously.  Strength Assessment Site Shoulder  Left Shoulder Flexion 3/5  Left Shoulder ABduction 3-/5  Left Shoulder Internal Rotation 5/5  Left Shoulder External Rotation 3/5     Encounter Date: 08/22/2019   OT End of Session - 08/22/19 1420    Visit Number 14    Number of Visits 16    Authorization Type Medicaid    Authorization Time Period 8 visits approved ([place] [employer]) 07/29/19-10/29/19    Authorization - Visit Number 4    Authorization - Number of Visits 8    OT Start Time 1345   reassessment   OT Stop Time 1423    OT Time Calculation (min) 38 min    Activity Tolerance Patient tolerated treatment well    Behavior During Therapy WFL for tasks assessed/performed            Past Medical History:  Diagnosis Date  . Asthma   . Diabetes ([place])   . GERD (gastroesophageal reflux disease)   . High blood pressure   . Sleep apnea   . Tobacco use     Past Surgical History:  Procedure Laterality Date  . ENDOVENOUS ABLATION SAPHENOUS VEIN W/ LASER    . FOOT SURGERY    . JOINT REPLACEMENT    . SHOULDER OPEN ROTATOR CUFF REPAIR Left 05/16/2019   Procedure: ROTATOR CUFF REPAIR SHOULDER OPEN;  Surgeon: Carole Civil, MD;  Location: [facility];  Service: Orthopedics;  Laterality: Left;  with scalene block     There were no vitals filed for this visit.   Subjective Assessment - 08/22/19 1419    Subjective  S: Today will be my last day. i'm going to [place] for 2 weeks.    Currently in Pain? No/denies              [facility] OT Assessment - 08/22/19 1357      Assessment   Medical Diagnosis s/p left open RCR    Referring Provider (OT) Dr. Arther Abbott      Precautions   Precautions Shoulder    Type of Shoulder Precautions Weeks 1-4 (5/13-6/10) P/ROM, pendulums, scapular A/ROM; Weeks 4-12 (6/11-8/5) AA/ROM progressing to A/ROM. Week 12: 8/6+ strengthening.       ROM / Strength   AROM / PROM / Strength AROM;PROM;Strength      AROM   Overall AROM Comments assessed in seated position. IR/er adducted    AROM Assessment Site Shoulder  Right/Left Shoulder Left    Left Shoulder Flexion 120 Degrees   previous: same - with compensatory movement and shoulder ir.   Left Shoulder ABduction 60 Degrees   previous: 75   Left Shoulder Internal Rotation 90 Degrees   previous: same   Left Shoulder External Rotation 85 Degrees   previous: 75     PROM   Overall PROM Comments assessed in supine. IR/er adducted    PROM Assessment Site Shoulder    Right/Left Shoulder Left    Left Shoulder Flexion 155 Degrees   previous: same   Left Shoulder ABduction 180 Degrees   previous; 175   Left Shoulder Internal Rotation 90 Degrees   previous: same   Left Shoulder  External Rotation 80 Degrees   previous: same     Strength   Overall Strength Comments Assessed seated. IR/er adducted. Strength not assessed previously.    Strength Assessment Site Shoulder    Right/Left Shoulder Left    Left Shoulder Flexion 3/5    Left Shoulder ABduction 3-/5    Left Shoulder Internal Rotation 5/5    Left Shoulder External Rotation 3/5                    OT Treatments/Exercises (OP) - 08/22/19 1418      Exercises   Exercises Shoulder      Shoulder Exercises: Supine   Protraction PROM;5 reps    Horizontal ABduction PROM;5 reps    External Rotation PROM;5 reps    Internal Rotation PROM;5 reps    Flexion PROM;5 reps    ABduction PROM;5 reps      Shoulder Exercises: ROM/Strengthening   UBE (Upper Arm Bike) Level 1 2' forward 2' backwards   pace: 6.0-7.0     Modalities   Modalities Moist Heat      Moist Heat Therapy   Number Minutes Moist Heat 5 Minutes    Moist Heat Location Shoulder                  OT Education - 08/22/19 1419    Education Details Reviewed measurements, therapy goals, and HEP. Recommended continuing HEP provided at last session: A/ROM. Pt verbalized understanding.    Person(s) Educated Patient    Methods Explanation    Comprehension Verbalized understanding            OT Short Term Goals - 08/12/19 1350      OT SHORT TERM GOAL #1   Title Pt will be provided with and educated on HEP to improve LUE use during ADLs.    Time 4    Period Weeks    Target Date 06/27/19      OT SHORT TERM GOAL #2   Title Pt will increase LUE P/ROM to WNL to improve ability to donn shirts with minimal compensatory techniques.    Time 4    Period Weeks      OT SHORT TERM GOAL #3   Title Pt will increase LUE strength to 3+/5 to improve ability to reach items at waist to chest height during daily tasks.    Time 4    Period Weeks             OT Long Term Goals - 08/22/19 1408      OT LONG TERM GOAL #1   Title Pt will  decrease LUE pain to 3/10 or less to improve ability to sleep in preferred position without waking up due to pain.    Time 8  Period Weeks    Status Achieved      OT LONG TERM GOAL #2   Title Pt will increase LUE A/ROM to WNL to improve ability to reach over head and behind back when washing and fixing hair.    Baseline Patient is able to wash hair and back although her ROM is considered functional versus normal.    Time 8    Period Weeks    Status Partially Met      OT LONG TERM GOAL #3   Title Pt will decrease LUE fascial restrictions to trace amounts to improve mobility required for functional reaching tasks.    Time 8    Period Weeks    Status Achieved      OT LONG TERM GOAL #4   Title Pt will increase LUE strength to 4+/5 or greater to improve ability to lift pots and pans during cooking tasks.    Baseline Pt is able to lift small pots and has difficulty with larger pots. Difficulty reaching up into cabinets.    Time 8    Period Weeks    Status Not Met                 Plan - 08/22/19 1540    Clinical Impression Statement A: Reassessment completed this date. patient reports that she is going to [place] for 2 weeks and would like today to be her last day of therapy. A/ROM and P/ROM have remained the same from previous progress note. Pt has met all short term goals and 2 long term goals overall with 1 additional LTG partially met. Patient continues to have deficits with her A/ROM and strength. She is able to complete shoulder movements with minimal difficulty in a supine positon. As soon as she is sitting up against gravity she is unable to raise her arm up without internally rotating her shoulder. HEP has been updated for A/ROM exercises and all education has been completed. Pt verbalizes understanding.    Body Structure / Function / Physical Skills ADL;Endurance;UE functional use;Fascial restriction;Pain;ROM;IADL;Strength    Plan P: D/C from skilled OT services with HEP.     OT Home Exercise Plan eval: table slides 8/19: A/ROM shoulder exercises.    Consulted and Agree with Plan of Care Patient           Patient will benefit from skilled therapeutic intervention in order to improve the following deficits and impairments:   Body Structure / Function / Physical Skills: ADL, Endurance, UE functional use, Fascial restriction, Pain, ROM, IADL, Strength       Visit Diagnosis: Other symptoms and signs involving the musculoskeletal system  Acute pain of left shoulder    Problem List Patient Active Problem List   Diagnosis Date Noted  . S/P arthroscopy of left shoulder 05/16/19 06/24/2019  . Seizure-like activity ([place]) 03/18/2019     OCCUPATIONAL THERAPY DISCHARGE SUMMARY  Visits from Start of Care: 14  Current functional level related to goals / functional outcomes: See above   Remaining deficits: ROM and strength   Education / Equipment: A/ROM, pain management techniques Plan: Patient agrees to discharge.  Patient goals were partially met. Patient is being discharged due to financial reasons.  ?????       Ailene Ravel, OTR/L,CBIS  [phone]  08/22/2019, 3:51 PM  [facility] [address] [place], [place], [address] Phone: [phone]   Fax:  [phone]  Name: Emanuela Runnion MRN: [id number] Date of Birth: 05-20-1960

## 2019-08-23 ENCOUNTER — Encounter: Payer: Self-pay | Admitting: Orthopedic Surgery

## 2019-08-23 ENCOUNTER — Ambulatory Visit (INDEPENDENT_AMBULATORY_CARE_PROVIDER_SITE_OTHER): Payer: Medicaid Other | Admitting: Orthopedic Surgery

## 2019-08-23 VITALS — BP 149/98 | HR 78 | Ht 62.0 in

## 2019-08-23 DIAGNOSIS — Z9889 Other specified postprocedural states: Secondary | ICD-10-CM

## 2019-08-23 MED ORDER — TIZANIDINE HCL 4 MG PO TABS: 4.0000 mg | ORAL_TABLET | Freq: Three times a day (TID) | ORAL | 1 refills | Status: DC

## 2019-08-23 MED ORDER — TRAMADOL HCL 50 MG PO TABS: 50.0000 mg | ORAL_TABLET | Freq: Four times a day (QID) | ORAL | 0 refills | Status: DC | PRN

## 2019-08-23 NOTE — Progress Notes (Signed)
Chief Complaint  Patient presents with  . Shoulder Pain     4 Wk FOL/UP/POST OP RCR,LT/DOS 05/16/19*pt's Mcd OON    Pod 99  Alyssa Patrick continues to do well she is regained full forward elevation of her left arm is having no pain wants to be released  Heading to [place] for celebrations of birthdays  Exam shows full forward elevation of the left shoulder good strength in the left rotator cuff  Patient would like refills on her medication in case she has some difficulty during her travel  Meds ordered this encounter  Medications  . tiZANidine (ZANAFLEX) 4 MG tablet    Sig: Take 1 tablet (4 mg total) by mouth 3 (three) times daily.    Dispense:  90 tablet    Refill:  1  . traMADol (ULTRAM) 50 MG tablet    Sig: Take 1 tablet (50 mg total) by mouth every 6 (six) hours as needed.    Dispense:  28 tablet    Refill:  0

## 2019-08-26 ENCOUNTER — Encounter (HOSPITAL_COMMUNITY): Payer: Medicaid Other

## 2019-08-28 ENCOUNTER — Encounter (HOSPITAL_COMMUNITY): Payer: Medicaid Other

## 2019-08-29 ENCOUNTER — Other Ambulatory Visit: Payer: Self-pay | Admitting: Orthopedic Surgery

## 2019-08-29 DIAGNOSIS — Z9889 Other specified postprocedural states: Secondary | ICD-10-CM

## 2019-09-02 ENCOUNTER — Encounter (HOSPITAL_COMMUNITY): Payer: Medicaid Other

## 2019-09-04 ENCOUNTER — Encounter (HOSPITAL_COMMUNITY): Payer: Medicaid Other

## 2019-09-17 ENCOUNTER — Other Ambulatory Visit: Payer: Self-pay

## 2019-09-17 ENCOUNTER — Ambulatory Visit
Admission: RE | Admit: 2019-09-17 | Discharge: 2019-09-17 | Disposition: A | Discharge status: Home / Self Care | Payer: Medicaid Other | Source: Ambulatory Visit | Attending: General Practice | Admitting: General Practice

## 2019-09-17 DIAGNOSIS — Z1231 Encounter for screening mammogram for malignant neoplasm of breast: Secondary | ICD-10-CM

## 2019-09-18 ENCOUNTER — Encounter: Payer: Self-pay | Admitting: Internal Medicine

## 2019-09-24 ENCOUNTER — Other Ambulatory Visit: Payer: Self-pay | Admitting: Orthopedic Surgery

## 2019-09-24 DIAGNOSIS — Z9889 Other specified postprocedural states: Secondary | ICD-10-CM

## 2019-10-17 ENCOUNTER — Telehealth: Payer: Self-pay | Admitting: Orthopedic Surgery

## 2019-10-17 NOTE — Telephone Encounter (Signed)
Call received from patient(voice message)/call returned; reached patient's voice mail; left message.

## 2019-10-18 ENCOUNTER — Other Ambulatory Visit: Payer: Self-pay | Admitting: Orthopedic Surgery

## 2019-10-18 DIAGNOSIS — Z9889 Other specified postprocedural states: Secondary | ICD-10-CM

## 2019-10-18 NOTE — Telephone Encounter (Signed)
Patient aware of prescription refill. Patient offered appointment; states due to her insurance being out of network, she elects to have her records copied - said will come in to sign a release form; aware we will take care of internally. States she's being referred by her primary care provider to [facility]. Also knows to still call if needs anything, including appointment in the interim.

## 2019-10-22 ENCOUNTER — Other Ambulatory Visit: Payer: Self-pay | Admitting: Orthopedic Surgery

## 2019-10-22 DIAGNOSIS — Z9889 Other specified postprocedural states: Secondary | ICD-10-CM

## 2019-10-24 ENCOUNTER — Ambulatory Visit: Payer: Medicaid Other | Admitting: Internal Medicine

## 2019-11-07 ENCOUNTER — Ambulatory Visit: Payer: Medicaid Other | Admitting: Orthopedic Surgery

## 2019-11-15 ENCOUNTER — Other Ambulatory Visit: Payer: Self-pay | Admitting: Orthopedic Surgery

## 2019-11-15 DIAGNOSIS — Z9889 Other specified postprocedural states: Secondary | ICD-10-CM

## 2019-12-10 ENCOUNTER — Other Ambulatory Visit: Payer: Self-pay | Admitting: Orthopedic Surgery

## 2019-12-10 DIAGNOSIS — Z9889 Other specified postprocedural states: Secondary | ICD-10-CM

## 2020-01-10 ENCOUNTER — Other Ambulatory Visit: Payer: Self-pay | Admitting: Orthopedic Surgery

## 2020-01-10 DIAGNOSIS — Z9889 Other specified postprocedural states: Secondary | ICD-10-CM

## 2020-02-27 ENCOUNTER — Ambulatory Visit (HOSPITAL_COMMUNITY): Payer: Medicaid Other

## 2020-03-02 ENCOUNTER — Other Ambulatory Visit: Payer: Self-pay

## 2020-03-02 ENCOUNTER — Encounter (HOSPITAL_COMMUNITY): Payer: Self-pay | Admitting: Physical Therapy

## 2020-03-02 ENCOUNTER — Ambulatory Visit (HOSPITAL_COMMUNITY): Payer: Medicaid Other | Attending: Physical Medicine and Rehabilitation | Admitting: Physical Therapy

## 2020-03-02 DIAGNOSIS — M6281 Muscle weakness (generalized): Secondary | ICD-10-CM | POA: Diagnosis present

## 2020-03-02 DIAGNOSIS — R29898 Other symptoms and signs involving the musculoskeletal system: Secondary | ICD-10-CM | POA: Diagnosis present

## 2020-03-02 DIAGNOSIS — R2689 Other abnormalities of gait and mobility: Secondary | ICD-10-CM | POA: Diagnosis present

## 2020-03-02 DIAGNOSIS — M545 Low back pain, unspecified: Secondary | ICD-10-CM | POA: Diagnosis present

## 2020-03-02 NOTE — Therapy (Signed)
[facility] [address] [place], [place], [address] Phone: [phone]   Fax:  [phone]  Physical Therapy Evaluation  Patient Details  Name: Bayley Yarborough MRN: [id number] Date of Birth: 03/29/1960 Referring Provider (PT): Merri Ray MD   Encounter Date: 03/02/2020   PT End of Session - 03/02/20 1219    Visit Number 1    Number of Visits 8    Date for PT Re-Evaluation 03/30/20    Authorization Type Medicaid [place] Complete    Authorization Time Period requesting 8 visits    Authorization - Visit Number 1    Authorization - Number of Visits 1    PT Start Time 1137    PT Stop Time 1215    PT Time Calculation (min) 38 min    Activity Tolerance Patient tolerated treatment well    Behavior During Therapy [facility] for tasks assessed/performed           Past Medical History:  Diagnosis Date  . Asthma   . Diabetes (HCC)   . GERD (gastroesophageal reflux disease)   . High blood pressure   . Sleep apnea   . Tobacco use     Past Surgical History:  Procedure Laterality Date  . ENDOVENOUS ABLATION SAPHENOUS VEIN W/ LASER    . FOOT SURGERY    . JOINT REPLACEMENT    . SHOULDER OPEN ROTATOR CUFF REPAIR Left 05/16/2019   Procedure: ROTATOR CUFF REPAIR SHOULDER OPEN;  Surgeon: Vickki Hearing, MD;  Location: [facility];  Service: Orthopedics;  Laterality: Left;  with scalene block     There were no vitals filed for this visit.    Subjective Assessment - 03/02/20 1143    Subjective Patient is a 60 y.o. female who presents to physical therapy with c/o chronic LBP. She had rotator cuff repair last year but she is having trouble lifting her arm. She had back issues since she was 78.  She was getting epidural shots in her back since 2014-2015 which have helped. She was going to have an MRI on her back but has to do therapy first. She has increased pain with bending and household chores. Symptoms go down back of legs with increased  activity. Pain decreases with rest and ice. She will come in until the 10th because she wants an MRI on her back and doesnt want to use to many of her visits since she has to have her shoulder surgery at the end of March.    Limitations House hold activities;Walking;Standing;Lifting    Patient Stated Goals to get her MRI    Currently in Pain? Yes    Pain Score 8     Pain Location Back    Pain Descriptors / Indicators Sharp;Dull    Pain Type Chronic pain    Pain Onset More than a month ago    Pain Frequency Constant              [facility] PT Assessment - 03/02/20 0001      Assessment   Medical Diagnosis Bilateral LBP with bilateral sciatica    Referring Provider (PT) Merri Ray MD    Onset Date/Surgical Date 02/01/13    Prior Therapy Yes for back and shoulder      Precautions   Precautions None      Restrictions   Weight Bearing Restrictions No      Balance Screen   Has the patient fallen in the past 6 months Yes    How  many times? 2    Has the patient had a decrease in activity level because of a fear of falling?  No    Is the patient reluctant to leave their home because of a fear of falling?  No      Prior Function   Level of Independence Independent      Cognition   Overall Cognitive Status Within Functional Limits for tasks assessed      Observation/Other Assessments   Observations Ambulates without    Focus on Therapeutic Outcomes (FOTO)  n/a      Sensation   Light Touch Appears Intact      ROM / Strength   AROM / PROM / Strength AROM;Strength      AROM   AROM Assessment Site Lumbar    Lumbar Flexion 50% limited, pain    Lumbar Extension 0% limited    Lumbar - Right Side Bend 50% limited, pain    Lumbar - Left Side Bend 50% limited, pain    Lumbar - Right Rotation 25% limited    Lumbar - Left Rotation 25% limited      Strength   Strength Assessment Site Hip;Knee;Ankle    Right/Left Hip Right;Left    Right Hip Flexion 4-/5    Left Hip Flexion  4-/5    Right/Left Knee Right;Left    Right Knee Flexion 5/5    Right Knee Extension 5/5    Left Knee Flexion 5/5    Left Knee Extension 5/5    Right/Left Ankle Right;Left    Right Ankle Dorsiflexion 5/5    Left Ankle Dorsiflexion 5/5      Palpation   Spinal mobility hypomobile and painful lumbar and lower thoracic    Palpation comment TTP lower lumbar paraspinals, bilateral glutes      Transfers   Five time sit to stand comments  18.08 seconds without UE use      Ambulation/Gait   Ambulation/Gait Yes    Ambulation/Gait Assistance 6: Modified independent (Device/Increase time)    Ambulation Distance (Feet) 325 Feet    Assistive device None    Ambulation Surface Indoor    Gait velocity decreased    Gait Comments                      Objective measurements completed on examination: See above findings.               PT Education - 03/02/20 1137    Education Details Patient educated on exam findings, POC, scope of PT    Person(s) Educated Patient    Methods Explanation;Demonstration    Comprehension Verbalized understanding;Returned demonstration            PT Short Term Goals - 03/02/20 1224      PT SHORT TERM GOAL #1   Title Patient will be independent with HEP in order to improve functional outcomes.    Time 2    Period Weeks    Status New    Target Date 03/16/20      PT SHORT TERM GOAL #2   Title Patient will report at least 25% improvement in symptoms for improved quality of life.    Time 2    Period Weeks    Status New    Target Date 03/16/20             PT Long Term Goals - 03/02/20 1226      PT LONG TERM GOAL #1   Title  Patient will report at least 75% improvement in symptoms for improved quality of life.    Time 4    Period Weeks    Status New    Target Date 03/30/20      PT LONG TERM GOAL #2   Title Patient will be able to ambulate at least 400 feet in in order to demonstrate improved tolerance to activity.     Time 4    Period Weeks    Status New    Target Date 03/30/20      PT LONG TERM GOAL #3   Title Patient will be able to complete 5x STS in under 11.4 seconds in order to reduce the risk of falls.    Time 4    Period Weeks    Status New    Target Date 03/30/20                  Plan - 03/02/20 1220    Clinical Impression Statement Patient is a 61 y.o. female who presents to physical therapy with c/o chronic LBP. She presents with pain limited deficits in lumbar and LE strength, ROM, endurance, postural impairments, spinal mobility, hyperactive and tender musculature, and functional mobility with ADL. She is having to modify and restrict ADL as indicated by subjective information and objective measures which is affecting overall participation. Patient will benefit from skilled physical therapy in order to improve function and reduce impairment.    Personal Factors and Comorbidities Age;Fitness;Behavior Pattern;Past/Current Experience;Comorbidity 3+;Time since onset of injury/illness/exacerbation;Finances    Comorbidities extensive ortho/surgical history, chronic LBP, DM,HTN    Examination-Activity Limitations Locomotion Level;Transfers;Squat;Stairs;Stand;Bend;Lift;Hygiene/Grooming    Examination-Participation Restrictions Meal Prep;Cleaning;Community Activity;Laundry;Yard Work;Volunteer;Shop    Stability/Clinical Decision Making Stable/Uncomplicated    Clinical Decision Making Low    Rehab Potential Fair    PT Frequency 2x / week    PT Duration 4 weeks    PT Treatment/Interventions ADLs/Self Care Home Management;Aquatic Therapy;Canalith Repostioning;Cryotherapy;Electrical Stimulation;Iontophoresis 4mg /ml Dexamethasone;Moist Heat;Traction;DME Instruction;Gait training;Stair training;Functional mobility training;Patient/family education;Therapeutic activities;Therapeutic exercise;Balance training;Neuromuscular re-education;Manual techniques;Manual lymph drainage;Dry needling;Energy  conservation;Splinting;Taping;Spinal Manipulations;Joint Manipulations    PT Next Visit Plan test glute strength and possibly begin glute strengthening, core strengthening, lumbar stretches    Consulted and Agree with Plan of Care Patient           Patient will benefit from skilled therapeutic intervention in order to improve the following deficits and impairments:  Abnormal gait,Decreased range of motion,Obesity,Decreased endurance,Increased muscle spasms,Decreased activity tolerance,Pain,Impaired perceived functional ability,Hypomobility,Impaired flexibility,Improper body mechanics,Postural dysfunction,Decreased strength,Decreased mobility  Visit Diagnosis: Low back pain, unspecified back pain laterality, unspecified chronicity, unspecified whether sciatica present  Muscle weakness (generalized)  Other abnormalities of gait and mobility  Other symptoms and signs involving the musculoskeletal system     Problem List Patient Active Problem List   Diagnosis Date Noted  . S/P arthroscopy of left shoulder 05/16/19 06/24/2019  . Seizure-like activity (HCC) 03/18/2019    12:28 PM, 03/02/20 03/04/20 PT, DPT Physical Therapist at [facility]   [place] [facility] [address] [place], [place], [place] Phone: [phone]   Fax:  [phone]  Name: Jalei Shibley MRN: [id number] Date of Birth: 1960/12/09

## 2020-03-03 ENCOUNTER — Ambulatory Visit (HOSPITAL_COMMUNITY): Payer: Medicaid Other | Attending: Physical Medicine and Rehabilitation

## 2020-03-03 DIAGNOSIS — M545 Low back pain, unspecified: Secondary | ICD-10-CM | POA: Insufficient documentation

## 2020-03-03 DIAGNOSIS — R2689 Other abnormalities of gait and mobility: Secondary | ICD-10-CM | POA: Insufficient documentation

## 2020-03-03 DIAGNOSIS — M6281 Muscle weakness (generalized): Secondary | ICD-10-CM | POA: Diagnosis present

## 2020-03-03 DIAGNOSIS — R29898 Other symptoms and signs involving the musculoskeletal system: Secondary | ICD-10-CM | POA: Insufficient documentation

## 2020-03-03 NOTE — Therapy (Signed)
[facility] [address] [place], [place], [address] Phone: [phone]   Fax:  [phone]  Physical Therapy Treatment  Patient Details  Name: Alyssa Patrick MRN: [id number] Date of Birth: Sep 16, 1960 Referring Provider (PT): Merri Ray MD   Encounter Date: 03/03/2020   PT End of Session - 03/03/20 1400    Visit Number 2    Number of Visits 8    Date for PT Re-Evaluation 03/30/20    Authorization Type Medicaid [place] Complete    Authorization Time Period requesting 8 visits    Authorization - Visit Number 1    Authorization - Number of Visits 1    PT Start Time 1346    PT Stop Time 1426    PT Time Calculation (min) 40 min    Activity Tolerance Patient tolerated treatment well    Behavior During Therapy [facility] for tasks assessed/performed           Past Medical History:  Diagnosis Date  . Asthma   . Diabetes (HCC)   . GERD (gastroesophageal reflux disease)   . High blood pressure   . Sleep apnea   . Tobacco use     Past Surgical History:  Procedure Laterality Date  . ENDOVENOUS ABLATION SAPHENOUS VEIN W/ LASER    . FOOT SURGERY    . JOINT REPLACEMENT    . SHOULDER OPEN ROTATOR CUFF REPAIR Left 05/16/2019   Procedure: ROTATOR CUFF REPAIR SHOULDER OPEN;  Surgeon: Vickki Hearing, MD;  Location: [facility];  Service: Orthopedics;  Laterality: Left;  with scalene block     There were no vitals filed for this visit.   Subjective Assessment - 03/03/20 1350    Subjective Patient denies any referred leg pain at this time but continues to complain of LBP. Patient reports her leg symptoms come and go in episodes and reports that the walking helps alleviate    Limitations House hold activities;Walking;Standing;Lifting    Patient Stated Goals to get her MRI    Currently in Pain? Yes    Pain Score 8     Pain Location Back    Pain Orientation Left    Pain Descriptors / Indicators Sharp;Dull    Pain Type Chronic pain    Pain  Onset More than a month ago              [facility] PT Assessment - 03/03/20 0001      Assessment   Medical Diagnosis Bilateral LBP with bilateral sciatica    Referring Provider (PT) Merri Ray MD                         [facility] Adult PT Treatment/Exercise - 03/03/20 0001      Exercises   Exercises Lumbar      Lumbar Exercises: Stretches   Prone on Elbows Stretch 4 reps;60 seconds      Lumbar Exercises: Supine   Ab Set 20 reps;2 seconds    Straight Leg Raise 20 reps;2 seconds    Other Supine Lumbar Exercises hip add: ball squeeze 2x10                  PT Education - 03/03/20 1359    Education Details pt education on spine anatomy and disc pressures as it pertains to posture and position    Person(s) Educated Patient    Methods Explanation    Comprehension Verbalized understanding  PT Short Term Goals - 03/02/20 1224      PT SHORT TERM GOAL #1   Title Patient will be independent with HEP in order to improve functional outcomes.    Time 2    Period Weeks    Status New    Target Date 03/16/20      PT SHORT TERM GOAL #2   Title Patient will report at least 25% improvement in symptoms for improved quality of life.    Time 2    Period Weeks    Status New    Target Date 03/16/20             PT Long Term Goals - 03/02/20 1226      PT LONG TERM GOAL #1   Title Patient will report at least 75% improvement in symptoms for improved quality of life.    Time 4    Period Weeks    Status New    Target Date 03/30/20      PT LONG TERM GOAL #2   Title Patient will be able to ambulate at least 400 feet in in order to demonstrate improved tolerance to activity.    Time 4    Period Weeks    Status New    Target Date 03/30/20      PT LONG TERM GOAL #3   Title Patient will be able to complete 5x STS in under 11.4 seconds in order to reduce the risk of falls.    Time 4    Period Weeks    Status New    Target Date 03/30/20                  Plan - 03/03/20 1422    Clinical Impression Statement Patient reports no LE symptoms at present today and able to tolerate prone position and maneuver to supine without increase in LBP or LE pain.  Demonstrates pronounced lumbar lordosis and increase in back pressure with prone knee bending.  Continued tx sessions indicated to improve core strength and activity tolerance    Personal Factors and Comorbidities Age;Fitness;Behavior Pattern;Past/Current Experience;Comorbidity 3+;Time since onset of injury/illness/exacerbation;Finances    Comorbidities extensive ortho/surgical history, chronic LBP, DM,HTN    Examination-Activity Limitations Locomotion Level;Transfers;Squat;Stairs;Stand;Bend;Lift;Hygiene/Grooming    Examination-Participation Restrictions Meal Prep;Cleaning;Community Activity;Laundry;Yard Work;Volunteer;Shop    Stability/Clinical Decision Making Stable/Uncomplicated    Rehab Potential Fair    PT Frequency 2x / week    PT Duration 4 weeks    PT Treatment/Interventions ADLs/Self Care Home Management;Aquatic Therapy;Canalith Repostioning;Cryotherapy;Electrical Stimulation;Iontophoresis 4mg /ml Dexamethasone;Moist Heat;Traction;DME Instruction;Gait training;Stair training;Functional mobility training;Patient/family education;Therapeutic activities;Therapeutic exercise;Balance training;Neuromuscular re-education;Manual techniques;Manual lymph drainage;Dry needling;Energy conservation;Splinting;Taping;Spinal Manipulations;Joint Manipulations    PT Next Visit Plan test glute strength and possibly begin glute strengthening, core strengthening, lumbar stretches    Consulted and Agree with Plan of Care Patient           Patient will benefit from skilled therapeutic intervention in order to improve the following deficits and impairments:  Abnormal gait,Decreased range of motion,Obesity,Decreased endurance,Increased muscle spasms,Decreased activity tolerance,Pain,Impaired  perceived functional ability,Hypomobility,Impaired flexibility,Improper body mechanics,Postural dysfunction,Decreased strength,Decreased mobility  Visit Diagnosis: Low back pain, unspecified back pain laterality, unspecified chronicity, unspecified whether sciatica present  Muscle weakness (generalized)  Other abnormalities of gait and mobility  Other symptoms and signs involving the musculoskeletal system     Problem List Patient Active Problem List   Diagnosis Date Noted  . S/P arthroscopy of left shoulder 05/16/19 06/24/2019  . Seizure-like activity (HCC) 03/18/2019  2:25 PM, 03/03/20 M. Shary Decamp, PT, DPT Physical Therapist- [place] Office Number: [phone]  [facility] [address] [place], [place], [address] Phone: [phone]   Fax:  [phone]  Name: Baylen Dea MRN: [id number] Date of Birth: 03-Apr-1960

## 2020-03-09 ENCOUNTER — Other Ambulatory Visit: Payer: Self-pay | Admitting: Orthopedic Surgery

## 2020-03-09 DIAGNOSIS — Z9889 Other specified postprocedural states: Secondary | ICD-10-CM

## 2020-03-11 ENCOUNTER — Other Ambulatory Visit: Payer: Self-pay

## 2020-03-11 ENCOUNTER — Encounter (HOSPITAL_COMMUNITY): Payer: Self-pay | Admitting: Physical Therapy

## 2020-03-11 ENCOUNTER — Ambulatory Visit (HOSPITAL_COMMUNITY): Payer: Medicaid Other | Admitting: Physical Therapy

## 2020-03-11 DIAGNOSIS — R29898 Other symptoms and signs involving the musculoskeletal system: Secondary | ICD-10-CM

## 2020-03-11 DIAGNOSIS — M545 Low back pain, unspecified: Secondary | ICD-10-CM | POA: Diagnosis not present

## 2020-03-11 DIAGNOSIS — M6281 Muscle weakness (generalized): Secondary | ICD-10-CM

## 2020-03-11 DIAGNOSIS — R2689 Other abnormalities of gait and mobility: Secondary | ICD-10-CM

## 2020-03-11 NOTE — Therapy (Signed)
[place], [place], [address] Phone: [phone]   Fax:  [phone]  Physical Therapy Treatment  Patient Details  Name: Alyssa Patrick MRN: [id number] Date of Birth: 10-04-1960 Referring Provider (PT): Sharlet Salina MD   Encounter Date: 03/11/2020   PT End of Session - 03/11/20 1443    Visit Number 3    Number of Visits 8    Date for PT Re-Evaluation 03/30/20    Authorization Type Medicaid [place] Complete    Authorization Time Period requesting 8 visits (Still pending 03/11/20)    Authorization - Visit Number 3    Authorization - Number of Visits 1    PT Start Time 1435    PT Stop Time 1505    PT Time Calculation (min) 30 min    Activity Tolerance Patient tolerated treatment well    Behavior During Therapy [facility] for tasks assessed/performed           Past Medical History:  Diagnosis Date  . Asthma   . Diabetes ([place])   . GERD (gastroesophageal reflux disease)   . High blood pressure   . Sleep apnea   . Tobacco use     Past Surgical History:  Procedure Laterality Date  . ENDOVENOUS ABLATION SAPHENOUS VEIN W/ LASER    . FOOT SURGERY    . JOINT REPLACEMENT    . SHOULDER OPEN ROTATOR CUFF REPAIR Left 05/16/2019   Procedure: ROTATOR CUFF REPAIR SHOULDER OPEN;  Surgeon: Carole Civil, MD;  Location: [facility];  Service: Orthopedics;  Laterality: Left;  with scalene block     There were no vitals filed for this visit.   Subjective Assessment - 03/11/20 1440    Subjective Patient says pain isn't too bad today, about a 5. She took a pain pill so it helps. The "cheek" is what's hurting today.    Limitations House hold activities;Walking;Standing;Lifting    Patient Stated Goals to get her MRI    Currently in Pain? Yes    Pain Score 5     Pain Location Hip    Pain Orientation Right    Pain Descriptors / Indicators Sharp;Shooting    Pain Type Chronic pain    Pain Onset More than a month ago    Pain  Frequency Constant                             [facility] Adult PT Treatment/Exercise - 03/11/20 0001      Lumbar Exercises: Standing   Other Standing Lumbar Exercises REIS x10 (no effect)      Lumbar Exercises: Supine   Ab Set 10 reps    Glut Set 15 reps    Bridge 10 reps    Straight Leg Raise 15 reps    Other Supine Lumbar Exercises iso hip adduction x 10                    PT Short Term Goals - 03/02/20 1224      PT SHORT TERM GOAL #1   Title Patient will be independent with HEP in order to improve functional outcomes.    Time 2    Period Weeks    Status New    Target Date 03/16/20      PT SHORT TERM GOAL #2   Title Patient will report at least 25% improvement in symptoms for improved quality of life.    Time  2    Period Weeks    Status New    Target Date 03/16/20             PT Long Term Goals - 03/02/20 1226      PT LONG TERM GOAL #1   Title Patient will report at least 75% improvement in symptoms for improved quality of life.    Time 4    Period Weeks    Status New    Target Date 03/30/20      PT LONG TERM GOAL #2   Title Patient will be able to ambulate at least 400 feet in 2MWT in order to demonstrate improved tolerance to activity.    Time 4    Period Weeks    Status New    Target Date 03/30/20      PT LONG TERM GOAL #3   Title Patient will be able to complete 5x STS in under 11.4 seconds in order to reduce the risk of falls.    Time 4    Period Weeks    Status New    Target Date 03/30/20                 Plan - 03/11/20 1549    Clinical Impression Statement Patient tolerated session well today with minimal complaint. Patient shows good return of prior exercises. Noted increased point tenderness near RT SI sulcus per patient subjective complaint. Upon further assessment of patient subjective complaint of pain, addressed possible SI joint contribution. Educated patient on self-MET for SI joint correction to reduce  local and referred pain. Patient issued updated HEP handout. Will assess response at [place] visit. Patient had to leave session early today per transportation accommodation. Patient will continue to benefit from skilled therapy services to address limitations and improve functional ability.    Personal Factors and Comorbidities Age;Fitness;Behavior Pattern;Past/Current Experience;Comorbidity 3+;Time since onset of injury/illness/exacerbation;Finances    Comorbidities extensive ortho/surgical history, chronic LBP, DM,HTN    Examination-Activity Limitations Locomotion Level;Transfers;Squat;Stairs;Stand;Bend;Lift;Hygiene/Grooming    Examination-Participation Restrictions Meal Prep;Cleaning;Community Activity;Laundry;Yard Work;Volunteer;Shop    Stability/Clinical Decision Making Stable/Uncomplicated    Rehab Potential Fair    PT Frequency 2x / week    PT Duration 4 weeks    PT Treatment/Interventions ADLs/Self Care Home Management;Aquatic Therapy;Canalith Repostioning;Cryotherapy;Electrical Stimulation;Iontophoresis 4mg /ml Dexamethasone;Moist Heat;Traction;DME Instruction;Gait training;Stair training;Functional mobility training;Patient/family education;Therapeutic activities;Therapeutic exercise;Balance training;Neuromuscular re-education;Manual techniques;Manual lymph drainage;Dry needling;Energy conservation;Splinting;Taping;Spinal Manipulations;Joint Manipulations    PT Next Visit Plan Assess response to SI MET. Progress core strength as tolerated.    PT Home Exercise Plan 3/9 iso hip abduction/ adduction, bridge    Consulted and Agree with Plan of Care Patient           Patient will benefit from skilled therapeutic intervention in order to improve the following deficits and impairments:  Abnormal gait,Decreased range of motion,Obesity,Decreased endurance,Increased muscle spasms,Decreased activity tolerance,Pain,Impaired perceived functional ability,Hypomobility,Impaired flexibility,Improper  body mechanics,Postural dysfunction,Decreased strength,Decreased mobility  Visit Diagnosis: Low back pain, unspecified back pain laterality, unspecified chronicity, unspecified whether sciatica present  Muscle weakness (generalized)  Other abnormalities of gait and mobility  Other symptoms and signs involving the musculoskeletal system     Problem List Patient Active Problem List   Diagnosis Date Noted  . S/P arthroscopy of left shoulder 05/16/19 06/24/2019  . Seizure-like activity ([place]) 03/18/2019    3:58 PM, 03/11/20 Josue Hector PT DPT  Physical Therapist with [facility]  [phone]   [facility] [address] [place], [place], [address] Phone: [phone]   Fax:  [phone]  Name: Kayslee Furey MRN: [id number] Date of Birth: 28-Feb-1960

## 2020-03-12 ENCOUNTER — Other Ambulatory Visit: Payer: Self-pay | Admitting: Surgery

## 2020-03-12 ENCOUNTER — Ambulatory Visit (HOSPITAL_COMMUNITY): Payer: Medicaid Other | Admitting: Physical Therapy

## 2020-03-12 ENCOUNTER — Encounter (HOSPITAL_COMMUNITY): Payer: Self-pay | Admitting: Physical Therapy

## 2020-03-12 DIAGNOSIS — R2689 Other abnormalities of gait and mobility: Secondary | ICD-10-CM

## 2020-03-12 DIAGNOSIS — R29898 Other symptoms and signs involving the musculoskeletal system: Secondary | ICD-10-CM

## 2020-03-12 DIAGNOSIS — M545 Low back pain, unspecified: Secondary | ICD-10-CM

## 2020-03-12 DIAGNOSIS — M6281 Muscle weakness (generalized): Secondary | ICD-10-CM

## 2020-03-12 NOTE — Therapy (Signed)
[facility] [address] [place], [place], [address] Phone: [phone]   Fax:  [phone]  Physical Therapy Treatment  Patient Details  Name: Alyssa Patrick MRN: [id number] Date of Birth: [birth year] Referring Provider (PT): Sharlet Salina MD   PHYSICAL THERAPY DISCHARGE SUMMARY  Visits from Start of Care: 4 Current functional level related to goals / functional outcomes: See below    Remaining deficits: See below    Education / Equipment:  See assessment  Plan: Patient agrees to discharge.  Patient goals were partially met. Patient is being discharged due to the patient's request.  ?????       Encounter Date: 03/12/2020   PT End of Session - 03/12/20 1125    Visit Number 4    Number of Visits 8    Date for PT Re-Evaluation 03/30/20    Authorization Type Medicaid [place] Complete    Authorization Time Period requesting 8 visits (Still pending 03/12/20)    Authorization - Visit Number 4    Authorization - Number of Visits 1    PT Start Time 1115    PT Stop Time 1143    PT Time Calculation (min) 28 min    Activity Tolerance Patient tolerated treatment well    Behavior During Therapy WFL for tasks assessed/performed           Past Medical History:  Diagnosis Date  . Asthma   . Diabetes ([place])   . GERD (gastroesophageal reflux disease)   . High blood pressure   . Sleep apnea   . Tobacco use     Past Surgical History:  Procedure Laterality Date  . ENDOVENOUS ABLATION SAPHENOUS VEIN W/ LASER    . FOOT SURGERY    . JOINT REPLACEMENT    . SHOULDER OPEN ROTATOR CUFF REPAIR Left 05/16/2019   Procedure: ROTATOR CUFF REPAIR SHOULDER OPEN;  Surgeon: Carole Civil, MD;  Location: [facility];  Service: Orthopedics;  Laterality: Left;  with scalene block     There were no vitals filed for this visit.   Subjective Assessment - 03/12/20 1117    Subjective Patient says her pain level is high today. She notes that she  fell on her tail bone maybe a week ago and thinks this is contributing. She would like today to be her last visit because she wants to conserve visits for her upcoming shoulder surgery. She feels that exercises have been helpful, but doing exercise is difficult sometimes due to elevated pain level when she is hurting. Patient reports 0% improvement with back pain since staring therapy.    Limitations House hold activities;Walking;Standing;Lifting    Patient Stated Goals to get her MRI    Currently in Pain? Yes    Pain Score 10-Worst pain ever    Pain Location Sacrum    Pain Orientation Posterior;Lower    Pain Descriptors / Indicators Sharp;Shooting    Pain Type Chronic pain    Pain Onset More than a month ago    Pain Frequency Constant    Aggravating Factors  Sitting, car rides    Pain Relieving Factors movement    Effect of Pain on Daily Activities Limits              [facility] PT Assessment - 03/12/20 0001      Assessment   Medical Diagnosis Bilateral LBP with bilateral sciatica    Referring Provider (PT) Sharlet Salina MD      Balance Screen   Has the patient  fallen in the past 6 months Yes    How many times? 3    Has the patient had a decrease in activity level because of a fear of falling?  No    Is the patient reluctant to leave their home because of a fear of falling?  No      Home Ecologist residence    Living Arrangements Spouse/significant other      Cognition   Overall Cognitive Status Within Functional Limits for tasks assessed      AROM   Lumbar Flexion 50% limited    Lumbar Extension WFL    Lumbar - Right Side Bend 50% limited, pain    Lumbar - Left Side Bend 25% limited   was 50%     Strength   Right Hip Flexion 4+/5    Left Hip Flexion 5/5    Right Knee Extension 5/5    Left Knee Extension 5/5    Right Ankle Dorsiflexion 4+/5    Left Ankle Dorsiflexion 5/5      Transfers   Five time sit to stand comments  10.1 seconds  with no UE      Ambulation/Gait   Ambulation/Gait Yes    Ambulation/Gait Assistance 7: Independent    Ambulation Distance (Feet) 450 Feet    Assistive device None    Gait Pattern Trendelenburg    Ambulation Surface Level;Indoor    Gait Comments 2MWT                                 PT Education - 03/12/20 1124    Education Details on reassessment findings, progress to therapy goals and encouraged to f/u with PCP. DC to HEP    Person(s) Educated Patient    Methods Explanation    Comprehension Verbalized understanding            PT Short Term Goals - 03/12/20 1137      PT SHORT TERM GOAL #1   Title Patient will be independent with HEP in order to improve functional outcomes.    Baseline Reports compliance    Time 2    Period Weeks    Status Achieved    Target Date 03/16/20      PT SHORT TERM GOAL #2   Title Patient will report at least 25% improvement in symptoms for improved quality of life.    Baseline Reports 0%    Time 2    Period Weeks    Status Not Met    Target Date 03/16/20             PT Long Term Goals - 03/12/20 1137      PT LONG TERM GOAL #1   Title Patient will report at least 75% improvement in symptoms for improved quality of life.    Baseline Reports 0%    Time 4    Period Weeks    Status Not Met      PT LONG TERM GOAL #2   Title Patient will be able to ambulate at least 400 feet in 2MWT in order to demonstrate improved tolerance to activity.    Baseline Current 450 feet with no AD    Time 4    Period Weeks    Status Achieved      PT LONG TERM GOAL #3   Title Patient will be able to complete 5x STS in under 11.4 seconds  in order to reduce the risk of falls.    Baseline Current 10.1 with no UEs    Time 4    Period Weeks    Status Achieved                 Plan - 03/12/20 1147    Clinical Impression Statement Patient requesting DC from therapy today citing insurance reasons. Patient shows moderate  improvements in functional level and strength, despite subjective report of 0% improvement. She has partially met the stated long-term goals. Reviewed HEP and educated patient on pacing activity level as well as appropriate activity progressions graded for pain. Patient reports today that she fell last week at some point, which she had not previously made known. She thinks she may have injured her tailbone and is citing recent flare up of pain in this area. Encouraged patient to follow up with PCP about this issue to rule out further injury. Tenderness to palpation in this area noted at last session. Patient being DC today per self-request to transition to home exercise. Patient encouraged to follow up with therapy services with nay further questions or concerns.    Personal Factors and Comorbidities Age;Fitness;Behavior Pattern;Past/Current Experience;Comorbidity 3+;Time since onset of injury/illness/exacerbation;Finances    Comorbidities extensive ortho/surgical history, chronic LBP, DM,HTN    Examination-Activity Limitations Locomotion Level;Transfers;Squat;Stairs;Stand;Bend;Lift;Hygiene/Grooming    Examination-Participation Restrictions Meal Prep;Cleaning;Community Activity;Laundry;Yard Work;Volunteer;Shop    Stability/Clinical Decision Making Stable/Uncomplicated    Rehab Potential Fair    PT Treatment/Interventions ADLs/Self Care Home Management;Aquatic Therapy;Canalith Repostioning;Cryotherapy;Electrical Stimulation;Iontophoresis 17m/ml Dexamethasone;Moist Heat;Traction;DME Instruction;Gait training;Stair training;Functional mobility training;Patient/family education;Therapeutic activities;Therapeutic exercise;Balance training;Neuromuscular re-education;Manual techniques;Manual lymph drainage;Dry needling;Energy conservation;Splinting;Taping;Spinal Manipulations;Joint Manipulations    PT Next Visit Plan DC to HEP    PT Home Exercise Plan 3/9 iso hip abduction/ adduction, bridge    Consulted and  Agree with Plan of Care Patient           Patient will benefit from skilled therapeutic intervention in order to improve the following deficits and impairments:  Abnormal gait,Decreased range of motion,Obesity,Decreased endurance,Increased muscle spasms,Decreased activity tolerance,Pain,Impaired perceived functional ability,Hypomobility,Impaired flexibility,Improper body mechanics,Postural dysfunction,Decreased strength,Decreased mobility  Visit Diagnosis: Low back pain, unspecified back pain laterality, unspecified chronicity, unspecified whether sciatica present  Muscle weakness (generalized)  Other abnormalities of gait and mobility  Other symptoms and signs involving the musculoskeletal system     Problem List Patient Active Problem List   Diagnosis Date Noted  . S/P arthroscopy of left shoulder 05/16/19 06/24/2019  . Seizure-like activity ([place] 03/18/2019    11:50 AM, 03/12/20 CJosue HectorPT DPT  [facility] [phone]SB[address] [place] [address]Phone: [phone]  Fax:  [phone] Name: SMirian CascoMRN: [id number] of Birth: 301/25/1962

## 2020-03-24 ENCOUNTER — Other Ambulatory Visit: Payer: Self-pay

## 2020-03-24 ENCOUNTER — Other Ambulatory Visit: Admission: RE | Admit: 2020-03-24 | Payer: Medicaid Other | Source: Ambulatory Visit

## 2020-03-24 ENCOUNTER — Encounter
Admission: RE | Admit: 2020-03-24 | Discharge: 2020-03-24 | Disposition: A | Discharge status: Home / Self Care | Payer: Medicaid Other | Source: Ambulatory Visit | Attending: Surgery | Admitting: Surgery

## 2020-03-24 DIAGNOSIS — Z01818 Encounter for other preprocedural examination: Secondary | ICD-10-CM | POA: Insufficient documentation

## 2020-03-24 DIAGNOSIS — Z01810 Encounter for preprocedural cardiovascular examination: Secondary | ICD-10-CM | POA: Diagnosis not present

## 2020-03-24 LAB — COMPREHENSIVE METABOLIC PANEL
ALT: 23 U/L (ref 0–44)
AST: 24 U/L (ref 15–41)
Albumin: 4.2 g/dL (ref 3.5–5.0)
Alkaline Phosphatase: 129 U/L — ABNORMAL HIGH (ref 38–126)
Anion gap: 7 (ref 5–15)
BUN: 13 mg/dL (ref 8–23)
CO2: 23 mmol/L (ref 22–32)
Calcium: 9.5 mg/dL (ref 8.9–10.3)
Chloride: 110 mmol/L (ref 98–111)
Creatinine, Ser: 0.66 mg/dL (ref 0.44–1.00)
GFR, Estimated: >60 mL/min (ref >60)
Glucose, Bld: 80 mg/dL (ref 70–99)
Potassium: 4 mmol/L (ref 3.5–5.1)
Sodium: 140 mmol/L (ref 135–145)
Total Bilirubin: 0.7 mg/dL (ref 0.3–1.2)
Total Protein: 7.3 g/dL (ref 6.5–8.1)

## 2020-03-24 LAB — CBC WITH DIFFERENTIAL/PLATELET
Abs Immature Granulocytes: 0.04 10*3/uL (ref 0.00–0.07)
Basophils Absolute: 0.1 10*3/uL (ref 0.0–0.1)
Basophils Relative: 1 %
Eosinophils Absolute: 0.4 10*3/uL (ref 0.0–0.5)
Eosinophils Relative: 6 %
HCT: 43.5 % (ref 36.0–46.0)
Hemoglobin: 13.2 g/dL (ref 12.0–15.0)
Immature Granulocytes: 1 %
Lymphocytes Relative: 39 %
Lymphs Abs: 3 10*3/uL (ref 0.7–4.0)
MCH: 22.8 pg — ABNORMAL LOW (ref 26.0–34.0)
MCHC: 30.3 g/dL (ref 30.0–36.0)
MCV: 75.3 fL — ABNORMAL LOW (ref 80.0–100.0)
Monocytes Absolute: 0.6 10*3/uL (ref 0.1–1.0)
Monocytes Relative: 8 %
Neutro Abs: 3.6 10*3/uL (ref 1.7–7.7)
Neutrophils Relative %: 45 %
Platelets: 256 10*3/uL (ref 150–400)
RBC: 5.78 MIL/uL — ABNORMAL HIGH (ref 3.87–5.11)
RDW: 15.1 % (ref 11.5–15.5)
WBC: 7.7 10*3/uL (ref 4.0–10.5)
nRBC: 0 % (ref 0.0–0.2)

## 2020-03-24 LAB — TYPE AND SCREEN
ABO/RH(D): O POS
Antibody Screen: NEGATIVE

## 2020-03-24 LAB — URINALYSIS, ROUTINE W REFLEX MICROSCOPIC
Bilirubin Urine: NEGATIVE
Glucose, UA: NEGATIVE mg/dL
Hgb urine dipstick: NEGATIVE
Ketones, ur: NEGATIVE mg/dL
Leukocytes,Ua: NEGATIVE
Nitrite: NEGATIVE
Protein, ur: NEGATIVE mg/dL
Specific Gravity, Urine: 1.028 (ref 1.005–1.030)
pH: 5 (ref 5.0–8.0)

## 2020-03-24 LAB — SURGICAL PCR SCREEN
MRSA, PCR: NEGATIVE
Staphylococcus aureus: NEGATIVE

## 2020-03-24 NOTE — Progress Notes (Signed)
Perioperative Services Pre-Admission/Anesthesia Testing   Date: 03/24/20 Name: Alyssa Patrick MRN:   [id number]  Re: Consideration of preoperative prophylactic antibiotic change   Request sent to: Poggi, Excell Seltzer, MD (routed and/or faxed via [facility])  Planned Surgical Procedure(s):    Case: [id number] Date/Time: 04/02/20 1012   Procedure: REVERSE SHOULDER ARTHROPLASTY (Left Shoulder)   Anesthesia type: Choice   Pre-op diagnosis:      Rotator cuff tendinitis, left M75.82     Type 2 diabetes mellitus with hyperglycemia, without long-term current use of insulin  E11.65     Status post left rotator cuff repair Z98.890     Nontraumatic incomplete tear of left rotator cuff M75.112   Location: [facility] OR ROOM 03 / [facility]   Surgeons: Christena Flake, MD    Notes: 1. Patient has a documented allergy to PCN G only . Advising that PCN has caused her to experience urticarial rash in the past.   2. Screened as appropriate for cephalosporin use during medication reconciliation . No immediate angioedema, dysphagia, SOB, anaphylaxis symptoms. . No severe rash involving mucous membranes or skin necrosis. . No hospital admissions related to side effects of PCN/cephalosporin use.  . No documented reaction to PCN or cephalosporin in the last 10 years.  Request:  As an evidence based approach to reducing the rate of incidence for post-operative SSI and the development of MDROs, could an agent with narrower coverage for preoperative prophylaxis in this patient's upcoming surgical course be considered?   1. Currently ordered preoperative prophylactic ABX: clindamycin.   2. Specifically requesting change to cephalosporin (CEFAZOLIN).   3. Please communicate decision with me and I will change the orders in Epic as per your direction.   Things to consider:  Many patients report that they were "allergic" to PCN earlier in life, however this does not translate into a true lifelong allergy.  Patients can lose sensitivity to specific IgE antibodies over time if PCN is avoided (Kleris & Lugar, 2019).   Up to 10% of the adult population and 15% of hospitalized patients report an allergy to PCN, however clinical studies suggest that 90% of those reporting an allergy can tolerate PCN antibiotics (Kleris & Lugar, 2019).   Cross-sensitivity between PCN and cephalosporins has been documented as being as high as 10%, however this estimation included data believed to have been collected in a setting where there was contamination. Newer data suggests that the prevalence of cross-sensitivity between PCN and cephalosporins is actually estimated to be closer to 1% (Hermanides et al., 2018).    Patients labeled as PCN allergic, whether they are truly allergic or not, have been found to have inferior outcomes in terms of rates of serious infection, and these patients tend to have longer hospital stays [facility] & Lugar, 2019).   Treatment related secondary infections, such as Clostridioides difficile, have been linked to the improper use of broad spectrum antibiotics in patients improperly labeled as PCN allergic (Kleris & Lugar, 2019).   Anaphylaxis from cephalosporins is rare and the evidence suggests that there is no increased risk of an anaphylactic type reaction when cephalosporins are used in a PCN allergic patient (Pichichero, 2006).  Citations: Hermanides J, Lemkes BA, Prins Gwenyth Bender MW, Terreehorst I. Presumed ?-Lactam Allergy and Cross-reactivity in the Operating Theater: A Practical Approach. Anesthesiology. 2018 Aug;129(2):335-342. doi: 10.1097/ALN.0000000000002252. PMID: 76283151.  Kleris, R. S., & Lugar, P. L. (2019). Things We Do For No Reason: Failing to Question a Penicillin Allergy History. Journal  of hospital medicine, 14(10), [phone]. Advance online publication. airportbarriers.com  Pichichero, M. E. (2006). Cephalosporins can be prescribed safely for  penicillin-allergic patients. Journal of family medicine, 55(2), 106-112. Accessed: https://cdn.mdedge.com/files/s59fs-public/Document/September-2017/5502JFP_AppliedEvidence1.pdf   Alyssa Mulling, MSN, APRN, FNP-C, CEN [facility]  Peri-operative Services Nurse Practitioner FAX: [phone] 03/24/20 1:13 PM

## 2020-03-24 NOTE — Patient Instructions (Signed)
Your procedure is scheduled on:  Thursday 04/02/20.  Report to THE FIRST FLOOR REGISTRATION DESK IN THE MEDICAL MALL ON THE MORNING OF SURGERY FIRST, THEN YOU WILL CHECK IN AT THE SURGERY INFORMATION DESK LOCATED OUTSIDE THE SAME DAY SURGERY DEPARTMENT LOCATED ON 2ND FLOOR MEDICAL MALL ENTRANCE.   To find out your arrival time please call [phone] between 1PM - 3PM on  Wednesday 04/01/20.   Remember: Instructions that are not followed completely may result in serious medical risk, up to and including death, or upon the discretion of your surgeon and anesthesiologist your surgery may need to be rescheduled.     __X__ 1. Do not eat food after midnight the night before your procedure.                 No gum chewing or hard candies. You may drink clear liquids up to 2 hours                 before you are scheduled to arrive for your surgery- DO NOT drink clear                 liquids within 2 hours of the start of your surgery.                 Clear Liquids include:  water, apple juice without pulp, clear carbohydrate                 drink such as Clearfast or Gatorade, Black Coffee or Tea (Do not add                 milk or creamer to coffee or tea).  __X__2.  On the morning of surgery brush your teeth with toothpaste and water, you may rinse your mouth with mouthwash if you wish.  Do not swallow any toothpaste or mouthwash.    __X__ 3.  No Alcohol for 24 hours before or after surgery.  __X__ 4.  Do Not Smoke or use e-cigarettes For 24 Hours Prior to Your Surgery.                 Do not use any chewable tobacco products for at least 6 hours prior to                 surgery.  __X__5.  Notify your doctor if there is any change in your medical condition      (cold, fever, infections).      Do NOT wear jewelry, make-up, hairpins, clips or nail polish. Do NOT wear lotions, powders, or perfumes.  Do NOT shave 48 hours prior to surgery. Men may shave face and neck. Do NOT bring valuables  to the hospital.     [facility] is not responsible for any belongings or valuables.   Contacts, dentures/partials or body piercings may not be worn into surgery. Bring a case for your contacts, glasses or hearing aids, a denture cup will be supplied.  Leave your suitcase in the car. After surgery it may be brought to your room.   For patients admitted to the hospital, discharge time is determined by your treatment team.      __X__ Take these medicines the morning of surgery with A SIP OF WATER:     1. albuterol (VENTOLIN HFA)   2. Budeson-Glycopyrrol-Formoterol (BREZTRI AEROSPHERE)  3. amLODipine (NORVASC)  4. atorvastatin (LIPITOR)   5. famotidine (PEPCID)  6. pantoprazole (PROTONIX)     __X__ Use CHG Soap/SAGE  wipes as directed  __X__ Use inhalers on the day of surgery. Also bring the inhaler with you to the hospital on the morning of surgery.  __X__ Stop Metformin 2 days prior to surgery. Your last dose will be on Monday 03/30/20.   __X__ Stop Anti-inflammatories 7 days before surgery such as Advil, Ibuprofen, Motrin, BC or Goodies Powder, Naprosyn, Naproxen, Aleve, Aspirin, Meloxicam. May take Tylenol if needed for pain or discomfort.   __X__Do not start taking any new herbal supplements or vitamins prior to your procedure.     Wear comfortable clothing (specific to your surgery type) to the hospital.  Plan for stool softeners for home use; pain medications have a tendency to cause constipation. You can also help prevent constipation by eating foods high in fiber such as fruits and vegetables and drinking plenty of fluids as your diet allows.  After surgery, you can prevent lung complications by doing breathing exercises.Take deep breaths and cough every 1-2 hours. Your doctor may order a device called an Incentive Spirometer to help you take deep breaths.  Please call the Pre-Admissions Testing Department at [phone] if you have any questions about these  instructions.

## 2020-03-30 NOTE — Progress Notes (Signed)
  Perioperative Services Pre-Admission/Anesthesia Testing     Date: 03/30/20  Name: Alyssa Patrick MRN:   [id number]  Re: Change in ABX for upcoming surgery   Case: [id number] Date/Time: 04/02/20 1012   Procedure: REVERSE SHOULDER ARTHROPLASTY (Left Shoulder)   Anesthesia type: Choice   Pre-op diagnosis:      Rotator cuff tendinitis, left M75.82     Type 2 diabetes mellitus with hyperglycemia, without long-term current use of insulin  E11.65     Status post left rotator cuff repair Z98.890     Nontraumatic incomplete tear of left rotator cuff M75.112   Location: [facility] OR ROOM 03 / [facility]   Surgeons: Christena Flake, MD    Primary attending surgeon was consulted regarding consideration of therapeutic change in antimicrobial agent being used for preoperative prophylaxis in this patient's upcoming surgical case. Following analysis of the risk versus benefits, Dr. Joice Lofts, Excell Seltzer, MD advising that it would be acceptable to discontinue the ordered clindamycin and place an order for cefazolin 2 gm IV on call to the OR. Orders for this patient were amended by me following collaborative conversation with attending surgeon.  Quentin Mulling, MSN, APRN, FNP-C, CEN [facility]  Peri-operative Services Nurse Practitioner Phone: [phone] 03/30/20 12:07 PM

## 2020-03-31 ENCOUNTER — Other Ambulatory Visit
Admission: RE | Admit: 2020-03-31 | Discharge: 2020-03-31 | Disposition: A | Discharge status: Home / Self Care | Payer: Medicaid Other | Source: Ambulatory Visit | Attending: Surgery | Admitting: Surgery

## 2020-03-31 ENCOUNTER — Other Ambulatory Visit: Payer: Self-pay

## 2020-03-31 DIAGNOSIS — Z01812 Encounter for preprocedural laboratory examination: Secondary | ICD-10-CM | POA: Diagnosis not present

## 2020-03-31 DIAGNOSIS — Z20822 Contact with and (suspected) exposure to covid-19: Secondary | ICD-10-CM | POA: Insufficient documentation

## 2020-03-31 LAB — SARS CORONAVIRUS 2 (TAT 6-24 HRS): SARS Coronavirus 2: NEGATIVE

## 2020-04-01 MED ORDER — SODIUM CHLORIDE 0.9 % IV SOLN: INTRAVENOUS | Status: DC

## 2020-04-01 MED ORDER — CEFAZOLIN SODIUM-DEXTROSE 2-4 GM/100ML-% IV SOLN
2.0000 g | Freq: Once | INTRAVENOUS | Status: AC
  Administered 2020-04-02: 2 g via INTRAVENOUS

## 2020-04-01 MED ORDER — ORAL CARE MOUTH RINSE: 15.0000 mL | Freq: Once | OROMUCOSAL | Status: AC

## 2020-04-01 MED ORDER — CHLORHEXIDINE GLUCONATE 0.12 % MT SOLN
15.0000 mL | Freq: Once | OROMUCOSAL | Status: AC
  Administered 2020-04-02: 15 mL via OROMUCOSAL

## 2020-04-02 ENCOUNTER — Inpatient Hospital Stay: Payer: Medicaid Other

## 2020-04-02 ENCOUNTER — Inpatient Hospital Stay
Admission: RE | Admit: 2020-04-02 | Discharge: 2020-04-03 | DRG: 483 | Disposition: A | Discharge status: Home / Self Care | Payer: Medicaid Other | Attending: Surgery | Admitting: Surgery

## 2020-04-02 ENCOUNTER — Inpatient Hospital Stay: Payer: Medicaid Other | Admitting: Urgent Care

## 2020-04-02 ENCOUNTER — Other Ambulatory Visit: Payer: Self-pay | Admitting: Orthopedic Surgery

## 2020-04-02 ENCOUNTER — Other Ambulatory Visit: Payer: Self-pay

## 2020-04-02 ENCOUNTER — Encounter
Admission: RE | Disposition: A | Discharge status: Home / Self Care | Payer: Self-pay | Source: Home / Self Care | Attending: Surgery

## 2020-04-02 ENCOUNTER — Encounter: Payer: Self-pay | Admitting: Surgery

## 2020-04-02 DIAGNOSIS — E1165 Type 2 diabetes mellitus with hyperglycemia: Secondary | ICD-10-CM | POA: Diagnosis present

## 2020-04-02 DIAGNOSIS — R569 Unspecified convulsions: Secondary | ICD-10-CM | POA: Diagnosis present

## 2020-04-02 DIAGNOSIS — I10 Essential (primary) hypertension: Secondary | ICD-10-CM | POA: Diagnosis present

## 2020-04-02 DIAGNOSIS — M75112 Incomplete rotator cuff tear or rupture of left shoulder, not specified as traumatic: Secondary | ICD-10-CM | POA: Diagnosis present

## 2020-04-02 DIAGNOSIS — Z7984 Long term (current) use of oral hypoglycemic drugs: Secondary | ICD-10-CM

## 2020-04-02 DIAGNOSIS — E785 Hyperlipidemia, unspecified: Secondary | ICD-10-CM | POA: Diagnosis present

## 2020-04-02 DIAGNOSIS — M19012 Primary osteoarthritis, left shoulder: Secondary | ICD-10-CM | POA: Diagnosis present

## 2020-04-02 DIAGNOSIS — E119 Type 2 diabetes mellitus without complications: Secondary | ICD-10-CM | POA: Diagnosis present

## 2020-04-02 DIAGNOSIS — K219 Gastro-esophageal reflux disease without esophagitis: Secondary | ICD-10-CM | POA: Diagnosis present

## 2020-04-02 DIAGNOSIS — Z6839 Body mass index (BMI) 39.0-39.9, adult: Secondary | ICD-10-CM

## 2020-04-02 DIAGNOSIS — M62512 Muscle wasting and atrophy, not elsewhere classified, left shoulder: Secondary | ICD-10-CM | POA: Diagnosis present

## 2020-04-02 DIAGNOSIS — J45909 Unspecified asthma, uncomplicated: Secondary | ICD-10-CM | POA: Diagnosis present

## 2020-04-02 DIAGNOSIS — G4730 Sleep apnea, unspecified: Secondary | ICD-10-CM | POA: Diagnosis present

## 2020-04-02 DIAGNOSIS — F17210 Nicotine dependence, cigarettes, uncomplicated: Secondary | ICD-10-CM | POA: Diagnosis present

## 2020-04-02 DIAGNOSIS — Z79899 Other long term (current) drug therapy: Secondary | ICD-10-CM | POA: Diagnosis not present

## 2020-04-02 DIAGNOSIS — M7582 Other shoulder lesions, left shoulder: Secondary | ICD-10-CM | POA: Diagnosis present

## 2020-04-02 DIAGNOSIS — Z20822 Contact with and (suspected) exposure to covid-19: Secondary | ICD-10-CM | POA: Diagnosis present

## 2020-04-02 DIAGNOSIS — Z419 Encounter for procedure for purposes other than remedying health state, unspecified: Secondary | ICD-10-CM

## 2020-04-02 DIAGNOSIS — Z96612 Presence of left artificial shoulder joint: Secondary | ICD-10-CM

## 2020-04-02 DIAGNOSIS — Z9889 Other specified postprocedural states: Secondary | ICD-10-CM

## 2020-04-02 HISTORY — PX: REVERSE SHOULDER ARTHROPLASTY: SHX5054

## 2020-04-02 LAB — GLUCOSE, CAPILLARY
Glucose-Capillary: 87 mg/dL (ref 70–99)
Glucose-Capillary: 143 mg/dL — ABNORMAL HIGH (ref 70–99)
Glucose-Capillary: 150 mg/dL — ABNORMAL HIGH (ref 70–99)
Glucose-Capillary: 269 mg/dL — ABNORMAL HIGH (ref 70–99)

## 2020-04-02 LAB — ABO/RH: ABO/RH(D): O POS

## 2020-04-02 SURGERY — ARTHROPLASTY, SHOULDER, TOTAL, REVERSE
Anesthesia: General | Site: Shoulder | Laterality: Left

## 2020-04-02 MED ORDER — DROPERIDOL 2.5 MG/ML IJ SOLN
0.6250 mg | Freq: Once | INTRAMUSCULAR | Status: DC | PRN
  Filled 2020-04-02: qty 2

## 2020-04-02 MED ORDER — ONDANSETRON HCL 4 MG PO TABS: 4.0000 mg | ORAL_TABLET | Freq: Four times a day (QID) | ORAL | Status: DC | PRN

## 2020-04-02 MED ORDER — ATORVASTATIN CALCIUM 20 MG PO TABS
40.0000 mg | ORAL_TABLET | Freq: Every day | ORAL | Status: DC
  Administered 2020-04-03: 40 mg via ORAL
  Filled 2020-04-02: qty 2

## 2020-04-02 MED ORDER — BUDESON-GLYCOPYRROL-FORMOTEROL 160-9-4.8 MCG/ACT IN AERO: 1.0000 | INHALATION_SPRAY | Freq: Every day | RESPIRATORY_TRACT | Status: DC

## 2020-04-02 MED ORDER — BUPIVACAINE LIPOSOME 1.3 % IJ SUSP
INTRAMUSCULAR | Status: AC
  Filled 2020-04-02: qty 20

## 2020-04-02 MED ORDER — CEFAZOLIN SODIUM-DEXTROSE 2-4 GM/100ML-% IV SOLN
2.0000 g | Freq: Four times a day (QID) | INTRAVENOUS | Status: AC
  Administered 2020-04-02 – 2020-04-03 (×3): 2 g via INTRAVENOUS
  Filled 2020-04-02 (×5): qty 100

## 2020-04-02 MED ORDER — ACETAMINOPHEN 10 MG/ML IV SOLN
INTRAVENOUS | Status: AC
  Filled 2020-04-02: qty 100

## 2020-04-02 MED ORDER — PROPOFOL 10 MG/ML IV BOLUS
INTRAVENOUS | Status: AC
  Filled 2020-04-02: qty 20

## 2020-04-02 MED ORDER — PHENYLEPHRINE HCL (PRESSORS) 10 MG/ML IV SOLN
INTRAVENOUS | Status: DC | PRN
  Administered 2020-04-02 (×3): 50 ug via INTRAVENOUS
  Administered 2020-04-02 (×3): 100 ug via INTRAVENOUS

## 2020-04-02 MED ORDER — MIDAZOLAM HCL 2 MG/2ML IJ SOLN
INTRAMUSCULAR | Status: AC
  Administered 2020-04-02: 1 mg via INTRAVENOUS
  Filled 2020-04-02: qty 2

## 2020-04-02 MED ORDER — GLIPIZIDE ER 2.5 MG PO TB24
2.5000 mg | ORAL_TABLET | Freq: Every day | ORAL | Status: DC
  Administered 2020-04-03: 2.5 mg via ORAL
  Filled 2020-04-02 (×2): qty 1

## 2020-04-02 MED ORDER — SODIUM CHLORIDE 0.9 % IV SOLN: INTRAVENOUS | Status: DC

## 2020-04-02 MED ORDER — PANTOPRAZOLE SODIUM 40 MG PO TBEC
40.0000 mg | DELAYED_RELEASE_TABLET | Freq: Two times a day (BID) | ORAL | Status: DC
  Administered 2020-04-02 – 2020-04-03 (×2): 40 mg via ORAL
  Filled 2020-04-02 (×2): qty 1

## 2020-04-02 MED ORDER — KETOROLAC TROMETHAMINE 15 MG/ML IJ SOLN
15.0000 mg | Freq: Four times a day (QID) | INTRAMUSCULAR | Status: DC
  Administered 2020-04-02 – 2020-04-03 (×3): 15 mg via INTRAVENOUS
  Filled 2020-04-02 (×3): qty 1

## 2020-04-02 MED ORDER — ACETAMINOPHEN 10 MG/ML IV SOLN
INTRAVENOUS | Status: DC | PRN
  Administered 2020-04-02: 1000 mg via INTRAVENOUS

## 2020-04-02 MED ORDER — ACETAMINOPHEN 325 MG PO TABS: 325.0000 mg | ORAL_TABLET | Freq: Four times a day (QID) | ORAL | Status: DC | PRN

## 2020-04-02 MED ORDER — BISACODYL 10 MG RE SUPP: 10.0000 mg | Freq: Every day | RECTAL | Status: DC | PRN

## 2020-04-02 MED ORDER — UMECLIDINIUM BROMIDE 62.5 MCG/INH IN AEPB
1.0000 | INHALATION_SPRAY | Freq: Every day | RESPIRATORY_TRACT | Status: DC
  Administered 2020-04-02 – 2020-04-03 (×2): 1 via RESPIRATORY_TRACT
  Filled 2020-04-02: qty 7

## 2020-04-02 MED ORDER — SUGAMMADEX SODIUM 200 MG/2ML IV SOLN
INTRAVENOUS | Status: DC | PRN
  Administered 2020-04-02: 200 mg via INTRAVENOUS

## 2020-04-02 MED ORDER — MAGNESIUM HYDROXIDE 400 MG/5ML PO SUSP: 30.0000 mL | Freq: Every day | ORAL | Status: DC | PRN

## 2020-04-02 MED ORDER — PROMETHAZINE HCL 25 MG/ML IJ SOLN: 6.2500 mg | INTRAMUSCULAR | Status: DC | PRN

## 2020-04-02 MED ORDER — KETOROLAC TROMETHAMINE 30 MG/ML IJ SOLN
30.0000 mg | Freq: Once | INTRAMUSCULAR | Status: AC
  Administered 2020-04-02: 30 mg via INTRAVENOUS

## 2020-04-02 MED ORDER — LACTATED RINGERS IV SOLN: INTRAVENOUS | Status: DC | PRN

## 2020-04-02 MED ORDER — HYDROCODONE-ACETAMINOPHEN 7.5-325 MG PO TABS: 1.0000 | ORAL_TABLET | Freq: Once | ORAL | Status: DC | PRN

## 2020-04-02 MED ORDER — MIDAZOLAM HCL 2 MG/2ML IJ SOLN
1.0000 mg | Freq: Once | INTRAMUSCULAR | Status: AC
  Administered 2020-04-02: 1 mg via INTRAVENOUS

## 2020-04-02 MED ORDER — FAMOTIDINE 20 MG PO TABS
40.0000 mg | ORAL_TABLET | Freq: Every day | ORAL | Status: DC
  Administered 2020-04-02 – 2020-04-03 (×2): 40 mg via ORAL
  Filled 2020-04-02 (×2): qty 2

## 2020-04-02 MED ORDER — DIPHENHYDRAMINE HCL 25 MG PO CAPS: 25.0000 mg | ORAL_CAPSULE | Freq: Four times a day (QID) | ORAL | Status: DC | PRN

## 2020-04-02 MED ORDER — DICYCLOMINE HCL 10 MG PO CAPS
10.0000 mg | ORAL_CAPSULE | Freq: Four times a day (QID) | ORAL | Status: DC | PRN
  Filled 2020-04-02: qty 1

## 2020-04-02 MED ORDER — OXYCODONE HCL 5 MG PO TABS: 5.0000 mg | ORAL_TABLET | ORAL | 0 refills | Status: DC | PRN

## 2020-04-02 MED ORDER — FENTANYL CITRATE (PF) 100 MCG/2ML IJ SOLN
50.0000 ug | Freq: Once | INTRAMUSCULAR | Status: AC
Start: 2020-04-02 — End: 2020-04-02

## 2020-04-02 MED ORDER — MOMETASONE FURO-FORMOTEROL FUM 100-5 MCG/ACT IN AERO
2.0000 | INHALATION_SPRAY | Freq: Two times a day (BID) | RESPIRATORY_TRACT | Status: DC
  Administered 2020-04-02 – 2020-04-03 (×2): 2 via RESPIRATORY_TRACT
  Filled 2020-04-02: qty 8.8

## 2020-04-02 MED ORDER — ONDANSETRON HCL 4 MG/2ML IJ SOLN
INTRAMUSCULAR | Status: AC
  Filled 2020-04-02: qty 2

## 2020-04-02 MED ORDER — ACETAMINOPHEN 160 MG/5ML PO SOLN
325.0000 mg | ORAL | Status: DC | PRN
  Filled 2020-04-02: qty 20.3

## 2020-04-02 MED ORDER — DEXAMETHASONE SODIUM PHOSPHATE 10 MG/ML IJ SOLN
INTRAMUSCULAR | Status: DC | PRN
  Administered 2020-04-02: 4 mg via INTRAVENOUS

## 2020-04-02 MED ORDER — ROCURONIUM BROMIDE 100 MG/10ML IV SOLN
INTRAVENOUS | Status: DC | PRN
  Administered 2020-04-02: 40 mg via INTRAVENOUS
  Administered 2020-04-02: 50 mg via INTRAVENOUS

## 2020-04-02 MED ORDER — GLYCOPYRROLATE 0.2 MG/ML IJ SOLN
INTRAMUSCULAR | Status: DC | PRN
  Administered 2020-04-02: .2 mg via INTRAVENOUS

## 2020-04-02 MED ORDER — OXYCODONE HCL 5 MG PO TABS
5.0000 mg | ORAL_TABLET | ORAL | Status: DC | PRN
  Administered 2020-04-02 – 2020-04-03 (×2): 10 mg via ORAL
  Filled 2020-04-02 (×2): qty 2

## 2020-04-02 MED ORDER — BUPIVACAINE HCL (PF) 0.5 % IJ SOLN
INTRAMUSCULAR | Status: DC | PRN
  Administered 2020-04-02: 5 mL via PERINEURAL

## 2020-04-02 MED ORDER — MIDAZOLAM HCL 2 MG/2ML IJ SOLN: 1.0000 mg | Freq: Once | INTRAMUSCULAR | Status: AC

## 2020-04-02 MED ORDER — LIDOCAINE HCL (CARDIAC) PF 100 MG/5ML IV SOSY
PREFILLED_SYRINGE | INTRAVENOUS | Status: DC | PRN
  Administered 2020-04-02: 100 mg via INTRAVENOUS

## 2020-04-02 MED ORDER — FENTANYL CITRATE (PF) 100 MCG/2ML IJ SOLN
INTRAMUSCULAR | Status: AC
  Administered 2020-04-02: 50 ug via INTRAVENOUS
  Filled 2020-04-02: qty 2

## 2020-04-02 MED ORDER — ONDANSETRON HCL 4 MG/2ML IJ SOLN
INTRAMUSCULAR | Status: DC | PRN
  Administered 2020-04-02: 4 mg via INTRAVENOUS

## 2020-04-02 MED ORDER — BUPIVACAINE HCL (PF) 0.5 % IJ SOLN
INTRAMUSCULAR | Status: AC
  Filled 2020-04-02: qty 10

## 2020-04-02 MED ORDER — DEXAMETHASONE SODIUM PHOSPHATE 10 MG/ML IJ SOLN
INTRAMUSCULAR | Status: AC
  Filled 2020-04-02: qty 1

## 2020-04-02 MED ORDER — DOCUSATE SODIUM 100 MG PO CAPS
100.0000 mg | ORAL_CAPSULE | Freq: Two times a day (BID) | ORAL | Status: DC
  Administered 2020-04-02 – 2020-04-03 (×2): 100 mg via ORAL
  Filled 2020-04-02 (×2): qty 1

## 2020-04-02 MED ORDER — FENTANYL CITRATE (PF) 100 MCG/2ML IJ SOLN
INTRAMUSCULAR | Status: AC
  Filled 2020-04-02: qty 2

## 2020-04-02 MED ORDER — ACETAMINOPHEN 500 MG PO TABS
1000.0000 mg | ORAL_TABLET | Freq: Four times a day (QID) | ORAL | Status: DC
  Administered 2020-04-02 – 2020-04-03 (×3): 1000 mg via ORAL
  Filled 2020-04-02 (×3): qty 2

## 2020-04-02 MED ORDER — LINAGLIPTIN 5 MG PO TABS
5.0000 mg | ORAL_TABLET | Freq: Every day | ORAL | Status: DC
  Administered 2020-04-03: 5 mg via ORAL
  Filled 2020-04-02 (×2): qty 1

## 2020-04-02 MED ORDER — KETOROLAC TROMETHAMINE 30 MG/ML IJ SOLN
INTRAMUSCULAR | Status: AC
  Filled 2020-04-02: qty 1

## 2020-04-02 MED ORDER — CEFAZOLIN SODIUM-DEXTROSE 2-4 GM/100ML-% IV SOLN
INTRAVENOUS | Status: AC
  Filled 2020-04-02: qty 100

## 2020-04-02 MED ORDER — SODIUM CHLORIDE (PF) 0.9 % IJ SOLN
INTRAMUSCULAR | Status: AC
  Filled 2020-04-02: qty 50

## 2020-04-02 MED ORDER — PROPOFOL 10 MG/ML IV BOLUS
INTRAVENOUS | Status: DC | PRN
  Administered 2020-04-02: 120 mg via INTRAVENOUS

## 2020-04-02 MED ORDER — TIZANIDINE HCL 4 MG PO TABS
4.0000 mg | ORAL_TABLET | Freq: Three times a day (TID) | ORAL | Status: DC | PRN
  Filled 2020-04-02: qty 1

## 2020-04-02 MED ORDER — MIDAZOLAM HCL 2 MG/2ML IJ SOLN
INTRAMUSCULAR | Status: AC
  Filled 2020-04-02: qty 2

## 2020-04-02 MED ORDER — METFORMIN HCL ER 500 MG PO TB24
500.0000 mg | ORAL_TABLET | Freq: Every day | ORAL | Status: DC
  Administered 2020-04-03: 500 mg via ORAL
  Filled 2020-04-02: qty 1

## 2020-04-02 MED ORDER — METOCLOPRAMIDE HCL 10 MG PO TABS: 5.0000 mg | ORAL_TABLET | Freq: Three times a day (TID) | ORAL | Status: DC | PRN

## 2020-04-02 MED ORDER — FLEET ENEMA 7-19 GM/118ML RE ENEM: 1.0000 | ENEMA | Freq: Once | RECTAL | Status: DC | PRN

## 2020-04-02 MED ORDER — METOCLOPRAMIDE HCL 5 MG/ML IJ SOLN
5.0000 mg | Freq: Three times a day (TID) | INTRAMUSCULAR | Status: DC | PRN
Start: 2020-04-02 — End: 2020-04-03

## 2020-04-02 MED ORDER — ONDANSETRON HCL 4 MG/2ML IJ SOLN: 4.0000 mg | Freq: Four times a day (QID) | INTRAMUSCULAR | Status: DC | PRN

## 2020-04-02 MED ORDER — TRANEXAMIC ACID 1000 MG/10ML IV SOLN
INTRAVENOUS | Status: DC | PRN
  Administered 2020-04-02: 1000 mg via INTRAVENOUS

## 2020-04-02 MED ORDER — ENOXAPARIN SODIUM 40 MG/0.4ML ~~LOC~~ SOLN
40.0000 mg | SUBCUTANEOUS | Status: DC
  Administered 2020-04-03: 40 mg via SUBCUTANEOUS
  Filled 2020-04-02: qty 0.4

## 2020-04-02 MED ORDER — HYDROMORPHONE HCL 1 MG/ML IJ SOLN: 0.5000 mg | INTRAMUSCULAR | Status: DC | PRN

## 2020-04-02 MED ORDER — FLUTICASONE PROPIONATE 50 MCG/ACT NA SUSP
2.0000 | Freq: Every day | NASAL | Status: DC | PRN
  Filled 2020-04-02: qty 16

## 2020-04-02 MED ORDER — FENTANYL CITRATE (PF) 100 MCG/2ML IJ SOLN
INTRAMUSCULAR | Status: DC | PRN
  Administered 2020-04-02: 50 ug via INTRAVENOUS

## 2020-04-02 MED ORDER — BUPIVACAINE LIPOSOME 1.3 % IJ SUSP
INTRAMUSCULAR | Status: DC | PRN
  Administered 2020-04-02: 15 mL via PERINEURAL

## 2020-04-02 MED ORDER — ALBUTEROL SULFATE HFA 108 (90 BASE) MCG/ACT IN AERS
1.0000 | INHALATION_SPRAY | Freq: Four times a day (QID) | RESPIRATORY_TRACT | Status: DC | PRN
  Filled 2020-04-02: qty 6.7

## 2020-04-02 MED ORDER — AMLODIPINE BESYLATE 5 MG PO TABS
5.0000 mg | ORAL_TABLET | Freq: Every day | ORAL | Status: DC
  Administered 2020-04-03: 5 mg via ORAL
  Filled 2020-04-02: qty 1

## 2020-04-02 MED ORDER — DIPHENHYDRAMINE HCL 12.5 MG/5ML PO ELIX
12.5000 mg | ORAL_SOLUTION | ORAL | Status: DC | PRN
Start: 2020-04-02 — End: 2020-04-03
  Administered 2020-04-02: 25 mg via ORAL
  Filled 2020-04-02: qty 10

## 2020-04-02 MED ORDER — ACETAMINOPHEN 325 MG PO TABS: 325.0000 mg | ORAL_TABLET | ORAL | Status: DC | PRN

## 2020-04-02 MED ORDER — BUPIVACAINE-EPINEPHRINE (PF) 0.5% -1:200000 IJ SOLN
INTRAMUSCULAR | Status: DC | PRN
  Administered 2020-04-02: 30 mL

## 2020-04-02 MED ORDER — CHLORHEXIDINE GLUCONATE 0.12 % MT SOLN
OROMUCOSAL | Status: AC
  Filled 2020-04-02: qty 15

## 2020-04-02 SURGICAL SUPPLY — 69 items
BASEPLATE GLENOSPHERE 25 (Plate) ×2 IMPLANT
BEARING HUMERAL SHLDER 36M STD (Shoulder) ×1 IMPLANT
BIT DRILL TWIST 2.7 (BIT) ×2 IMPLANT
BLADE SAW SAG 25.4X90 (BLADE) ×2 IMPLANT
BNDG COHESIVE 4X5 TAN STRL (GAUZE/BANDAGES/DRESSINGS) ×4 IMPLANT
CHLORAPREP W/TINT 26 (MISCELLANEOUS) ×2 IMPLANT
COOLER POLAR GLACIER W/PUMP (MISCELLANEOUS) ×2 IMPLANT
COVER BACK TABLE REUSABLE LG (DRAPES) ×2 IMPLANT
COVER WAND RF STERILE (DRAPES) ×2 IMPLANT
DRAPE 3/4 80X56 (DRAPES) ×4 IMPLANT
DRAPE IMP U-DRAPE 54X76 (DRAPES) ×4 IMPLANT
DRAPE INCISE IOBAN 66X45 STRL (DRAPES) ×4 IMPLANT
DRSG OPSITE POSTOP 4X6 (GAUZE/BANDAGES/DRESSINGS) ×2 IMPLANT
DRSG OPSITE POSTOP 4X8 (GAUZE/BANDAGES/DRESSINGS) ×2 IMPLANT
ELECT BLADE 6.5 EXT (BLADE) IMPLANT
ELECT CAUTERY BLADE 6.4 (BLADE) ×2 IMPLANT
ELECT REM PT RETURN 9FT ADLT (ELECTROSURGICAL) ×2
ELECTRODE REM PT RTRN 9FT ADLT (ELECTROSURGICAL) ×1 IMPLANT
GLENOID SPHERE STD STRL 36MM (Orthopedic Implant) ×2 IMPLANT
GLOVE SRG 8 PF TXTR STRL LF DI (GLOVE) ×1 IMPLANT
GLOVE SURG ENC MOIS LTX SZ7.5 (GLOVE) ×8 IMPLANT
GLOVE SURG ENC MOIS LTX SZ8 (GLOVE) ×8 IMPLANT
GLOVE SURG UNDER LTX SZ8 (GLOVE) ×2 IMPLANT
GLOVE SURG UNDER POLY LF SZ8 (GLOVE) ×1
GOWN STRL REUS W/ TWL LRG LVL3 (GOWN DISPOSABLE) ×1 IMPLANT
GOWN STRL REUS W/ TWL XL LVL3 (GOWN DISPOSABLE) ×1 IMPLANT
GOWN STRL REUS W/TWL LRG LVL3 (GOWN DISPOSABLE) ×1
GOWN STRL REUS W/TWL XL LVL3 (GOWN DISPOSABLE) ×1
HOOD PEEL AWAY FLYTE STAYCOOL (MISCELLANEOUS) ×6 IMPLANT
ILLUMINATOR WAVEGUIDE N/F (MISCELLANEOUS) IMPLANT
IV NS IRRIG 3000ML ARTHROMATIC (IV SOLUTION) ×2 IMPLANT
KIT STABILIZATION SHOULDER (MISCELLANEOUS) ×2 IMPLANT
KIT TURNOVER KIT A (KITS) ×2 IMPLANT
MANIFOLD NEPTUNE II (INSTRUMENTS) ×4 IMPLANT
MASK FACE SPIDER DISP (MASK) ×2 IMPLANT
MAT ABSORB  FLUID 56X50 GRAY (MISCELLANEOUS) ×1
MAT ABSORB FLUID 56X50 GRAY (MISCELLANEOUS) ×1 IMPLANT
NDL SAFETY ECLIPSE 18X1.5 (NEEDLE) ×1 IMPLANT
NEEDLE HYPO 18GX1.5 SHARP (NEEDLE) ×1
NEEDLE HYPO 22GX1.5 SAFETY (NEEDLE) ×2 IMPLANT
NEEDLE SPNL 20GX3.5 QUINCKE YW (NEEDLE) ×2 IMPLANT
NS IRRIG 500ML POUR BTL (IV SOLUTION) ×2 IMPLANT
PACK ARTHROSCOPY SHOULDER (MISCELLANEOUS) ×2 IMPLANT
PAD ARMBOARD 7.5X6 YLW CONV (MISCELLANEOUS) ×2 IMPLANT
PAD WRAPON POLAR SHDR UNIV (MISCELLANEOUS) ×1 IMPLANT
PENCIL SMOKE EVACUATOR (MISCELLANEOUS) ×2 IMPLANT
PIN THREADED REVERSE (PIN) ×2 IMPLANT
PULSAVAC PLUS IRRIG FAN TIP (DISPOSABLE) ×2
SCREW BONE LOCKING 4.75X30X3.5 (Screw) ×2 IMPLANT
SCREW BONE STRL 6.5MMX25MM (Screw) ×2 IMPLANT
SCREW LOCKING 4.75MMX15MM (Screw) ×2 IMPLANT
SCREW LOCKING NS 4.75MMX20MM (Screw) ×2 IMPLANT
SCREW NON-LOCK 4.75MMX15MM (Screw) ×2 IMPLANT
SHOULDER HUMERAL BEAR 36M STD (Shoulder) ×2 IMPLANT
SLING ULTRA II M (MISCELLANEOUS) ×2 IMPLANT
SPONGE LAP 18X18 RF (DISPOSABLE) ×2 IMPLANT
STAPLER SKIN PROX 35W (STAPLE) ×2 IMPLANT
STEM HUMERAL STRL 9MX55MM (Stem) ×2 IMPLANT
SUT ETHIBOND 0 MO6 C/R (SUTURE) ×2 IMPLANT
SUT FIBERWIRE #2 38 BLUE 1/2 (SUTURE) ×12
SUT VIC AB 0 CT1 36 (SUTURE) ×2 IMPLANT
SUT VIC AB 2-0 CT1 27 (SUTURE) ×2
SUT VIC AB 2-0 CT1 TAPERPNT 27 (SUTURE) ×2 IMPLANT
SUTURE FIBERWR #2 38 BLUE 1/2 (SUTURE) ×6 IMPLANT
SYR 10ML LL (SYRINGE) ×2 IMPLANT
TIP FAN IRRIG PULSAVAC PLUS (DISPOSABLE) ×1 IMPLANT
TRAY HUM REV SHOULDER STD +6 (Shoulder) ×2 IMPLANT
WRAP SHOULDER HOT/COLD PACK (SOFTGOODS) ×2 IMPLANT
WRAPON POLAR PAD SHDR UNIV (MISCELLANEOUS) ×2

## 2020-04-02 NOTE — Anesthesia Procedure Notes (Signed)
Procedure Name: Intubation Date/Time: 04/02/2020 11:03 AM Performed by: Justus Memory, CRNA Pre-anesthesia Checklist: Patient identified, Patient being monitored, Timeout performed, Emergency Drugs available and Suction available Patient Re-evaluated:Patient Re-evaluated prior to induction Oxygen Delivery Method: Circle system utilized Preoxygenation: Pre-oxygenation with 100% oxygen Induction Type: IV induction Ventilation: Mask ventilation without difficulty Laryngoscope Size: Mac, 3 and McGraph Grade View: Grade I Tube type: Oral Tube size: 7.0 mm Number of attempts: 1 Airway Equipment and Method: Stylet and Video-laryngoscopy Placement Confirmation: ETT inserted through vocal cords under direct vision,  positive ETCO2 and breath sounds checked- equal and bilateral Secured at: 21 cm Tube secured with: Tape Dental Injury: Teeth and Oropharynx as per pre-operative assessment  Difficulty Due To: Difficulty was anticipated, Difficult Airway- due to large tongue and Difficult Airway- due to anterior larynx Future Recommendations: Recommend- induction with short-acting agent, and alternative techniques readily available

## 2020-04-02 NOTE — Anesthesia Preprocedure Evaluation (Signed)
Anesthesia Evaluation  Patient identified by MRN, date of birth, ID band Patient awake    Reviewed: Allergy & Precautions, H&P , NPO status , reviewed documented beta blocker date and time   Airway Mallampati: III  TM Distance: >3 FB Neck ROM: limited    Dental  (+) Upper Dentures, Chipped, Missing, Poor Dentition   Pulmonary asthma , sleep apnea , Current Smoker and Patient abstained from smoking.,  Pt states doesn't tolerate CPAP. OSA/GA risks discussed, education done   Pulmonary exam normal        Cardiovascular hypertension, Normal cardiovascular exam     Neuro/Psych    GI/Hepatic GERD  Medicated and Controlled,  Endo/Other  diabetesMorbid obesity  Renal/GU      Musculoskeletal   Abdominal   Peds  Hematology   Anesthesia Other Findings Past Medical History: No date: Asthma No date: Diabetes ([facility]) No date: GERD (gastroesophageal reflux disease) No date: High blood pressure No date: Sleep apnea No date: Tobacco use Past Surgical History: No date: ENDOVENOUS ABLATION SAPHENOUS VEIN W/ LASER No date: FOOT SURGERY No date: JOINT REPLACEMENT 05/16/2019: SHOULDER OPEN ROTATOR CUFF REPAIR; Left     Comment:  Procedure: ROTATOR CUFF REPAIR SHOULDER OPEN;  Surgeon:               Vickki Hearing, MD;  Location: [facility];  Service:               Orthopedics;  Laterality: Left;  with scalene block    Reproductive/Obstetrics                             Anesthesia Physical Anesthesia Plan  ASA: III  Anesthesia Plan: General and General ETT   Post-op Pain Management:  Regional for Post-op pain   Induction: Intravenous  PONV Risk Score and Plan: Ondansetron, Midazolam and Treatment may vary due to age or medical condition  Airway Management Planned: Oral ETT  Additional Equipment:   Intra-op Plan:   Post-operative Plan: Extubation in OR  Informed Consent: I have reviewed the  patients History and Physical, chart, labs and discussed the procedure including the risks, benefits and alternatives for the proposed anesthesia with the patient or authorized representative who has indicated his/her understanding and acceptance.     Dental Advisory Given  Plan Discussed with: CRNA  Anesthesia Plan Comments:         Anesthesia Quick Evaluation

## 2020-04-02 NOTE — Discharge Summary (Signed)
Physician Discharge Summary  Patient ID: Alyssa Patrick MRN: [id number] DOB/AGE: 05/19/1960 60 y.o.  Admit date: 04/02/2020 Discharge date: April 03, 2020  Admission Diagnoses:  Status post reverse arthroplasty of shoulder, left [Z96.612]  Discharge Diagnoses: Patient Active Problem List   Diagnosis Date Noted  . Status post reverse arthroplasty of shoulder, left 04/02/2020  . S/P arthroscopy of left shoulder 05/16/19 06/24/2019  . Seizure-like activity (HCC) 03/18/2019    Past Medical History:  Diagnosis Date  . Asthma   . Diabetes (HCC)   . GERD (gastroesophageal reflux disease)   . High blood pressure   . Sleep apnea   . Tobacco use      Transfusion: None.   Consultants (if any):   Discharged Condition: Improved  Hospital Course: Alyssa Patrick is an 60 y.o. 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    Surgeries: Procedure(s): REVERSE SHOULDER ARTHROPLASTY on 04/02/2020 Patient tolerated the surgery well. Taken to PACU where she was stabilized and then transferred to the orthopedic floor.  Started on Lovenox 40mg  q 24 hrs. Foot pumps applied bilaterally at 80 mm. Heels elevated on bed with rolled towels. No evidence of DVT. Negative Homan. Physical therapy started on day #1 for gait training and transfer. OT started day #1 for ADL and assisted devices.  Patient's IV was removed on POD1.  Implants: All press-fit Biomet Comprehensive system with a #9 micro-humeral stem, a 40 mm +6 mm offset humeral tray with a standard insert, and a mini-base plate with a 36 mm glenosphere.  She was given perioperative antibiotics:  Anti-infectives (From admission, onward)   Start     Dose/Rate Route Frequency Ordered Stop   04/02/20 1700   ceFAZolin (ANCEF) IVPB 2g/100 mL premix        2 g 200 mL/hr over 30 Minutes Intravenous Every 6 hours 04/02/20 1521 04/03/20 0618   04/01/20 2315  ceFAZolin (ANCEF) IVPB 2g/100 mL premix        2 g 200 mL/hr over 30 Minutes Intravenous  Once 04/01/20 2314 04/02/20 1117    .  She was given sequential compression devices, early ambulation, and Lovenox for DVT prophylaxis.  She benefited maximally from the hospital stay and there were no complications.    Recent vital signs:  Vitals:   04/02/20 2319 04/03/20 0521  BP: (!) 144/98 127/81  Pulse: (!) 101 87  Resp:  20  Temp: 98.1 F (36.7 C) 98.3 F (36.8 C)  SpO2: 96% 95%    Recent laboratory studies:  Lab Results  Component Value Date   HGB 13.2 03/24/2020   HGB 12.6 05/14/2019   Lab Results  Component Value Date   WBC 7.7 03/24/2020   PLT 256 03/24/2020   No results found for: INR Lab Results  Component Value Date   NA 138 04/03/2020   K 4.1 04/03/2020   CL 107 04/03/2020   CO2 23 04/03/2020   BUN 13 04/03/2020   CREATININE 0.87 04/03/2020   GLUCOSE 203 (H) 04/03/2020   Discharge Medications:   Allergies as of 04/03/2020      Reactions   Metronidazole Diarrhea   Penicillin G Hives      Medication List    STOP taking these medications   traMADol 50 MG tablet Commonly known as: 06/03/2020  TAKE these medications   acetaminophen 325 MG tablet Commonly known as: TYLENOL Take 650 mg by mouth every 6 (six) hours as needed for moderate pain.   albuterol 108 (90 Base) MCG/ACT inhaler Commonly known as: VENTOLIN HFA Inhale 1 puff into the lungs every 6 (six) hours as needed for shortness of breath or wheezing.   amLODipine 5 MG tablet Commonly known as: NORVASC Take 5 mg by mouth daily.   atorvastatin 40 MG tablet Commonly known as: LIPITOR Take 40 mg by mouth daily.   Breztri Aerosphere 160-9-4.8 MCG/ACT Aero Generic drug: Budeson-Glycopyrrol-Formoterol Inhale 1 puff into the lungs daily.    diclofenac 75 MG EC tablet Commonly known as: VOLTAREN Take 75 mg by mouth 2 (two) times daily.   diclofenac Sodium 1 % Gel Commonly known as: VOLTAREN Apply 1 application topically 3 (three) times daily as needed (pain.).   dicyclomine 10 MG capsule Commonly known as: BENTYL Take 10 mg by mouth every 6 (six) hours as needed for spasms.   diphenhydrAMINE 25 MG tablet Commonly known as: BENADRYL Take 25 mg by mouth every 6 (six) hours as needed for allergies.   famotidine 40 MG tablet Commonly known as: PEPCID Take 40 mg by mouth daily.   fluticasone 50 MCG/ACT nasal spray Commonly known as: FLONASE Place 2 sprays into both nostrils daily as needed for allergies.   glipiZIDE 2.5 MG 24 hr tablet Commonly known as: GLUCOTROL XL Take 2.5 mg by mouth daily.   ibuprofen 800 MG tablet Commonly known as: ADVIL TAKE 1 TABLET BY MOUTH EVERY 8 HOURS AS NEEDED   meloxicam 7.5 MG tablet Commonly known as: MOBIC Take 7.5 mg by mouth daily as needed for pain.   metFORMIN 500 MG 24 hr tablet Commonly known as: GLUCOPHAGE-XR Take 500 mg by mouth in the morning and at bedtime.   naproxen 500 MG tablet Commonly known as: NAPROSYN Take 500 mg by mouth 2 (two) times daily as needed for moderate pain.   oxyCODONE 5 MG immediate release tablet Commonly known as: Oxy IR/ROXICODONE Take 1-2 tablets (5-10 mg total) by mouth every 4 (four) hours as needed for moderate pain (pain score 4-6).   pantoprazole 40 MG tablet Commonly known as: PROTONIX Take 40 mg by mouth 2 (two) times daily.   promethazine 12.5 MG tablet Commonly known as: PHENERGAN Take 1 tablet (12.5 mg total) by mouth every 6 (six) hours as needed for nausea or vomiting.   tiZANidine 4 MG tablet Commonly known as: ZANAFLEX TAKE 1 TABLET BY MOUTH 3 TIMES DAILY.   Tradjenta 5 MG Tabs tablet Generic drug: linagliptin Take 5 mg by mouth daily.      Diagnostic Studies: DG Shoulder Left Port  Result Date:  04/02/2020 CLINICAL DATA:  Status post left shoulder arthroplasty. EXAM: LEFT SHOULDER COMPARISON:  None. FINDINGS: The left glenoid and humeral components appear to be well situated. Expected postoperative changes are seen in the surrounding soft tissues. IMPRESSION: Status post left shoulder arthroplasty. Electronically Signed   By: Lupita Raider M.D.   On: 04/02/2020 15:19   [place] OR NERVE BLOCK-IMAGE ONLY [facility])  Result Date: 04/02/2020 There is no interpretation for this exam.  This order is for images obtained during a surgical procedure.  Please See "Surgeries" Tab for more information regarding the procedure.   Disposition: Plan for discharge home with HHPT on 04/03/20 pending progress with PT.   Follow-up Information    Anson [place], PA-C Follow up in 14 day(s).  Specialty: Physician Assistant Why: Mindi Slicker information: [address] [place] [address] [phone]              Signed: Lenard Forth, Marleta Lapierre PA-C 04/03/2020, 6:30 AM

## 2020-04-02 NOTE — H&P (Signed)
History of Present Illness:  Alyssa Patrick is a 60 y.o. female who presents for follow-up of her left shoulder pain secondary to impingement/tendinopathy with a possible recurrent rotator cuff tear. Overall, the patient feels that her symptoms are essentially unchanged since her last visit. She continues to have pain in her shoulder, especially when trying to perform any activities at or above shoulder level. She still has difficulty even raising her arm to shoulder level. She rates her pain at 8/10. She has been taking Tylenol and a muscle relaxer as necessary with limited benefit. She is quite frustrated by her persistent symptoms and functional limitations, and is ready to consider more aggressive treatment options. Since her last visit, she has undergone an MRI scan and presents today to review these results.  Current Outpatient Medications: . acetaminophen (TYLENOL) 500 MG tablet Take by mouth  . albuterol 90 mcg/actuation inhaler Inhale 2 inhalations into the lungs  . amLODIPine (NORVASC) 5 MG tablet Take 5 mg by mouth once daily  . atorvastatin (LIPITOR) 40 MG tablet Take 40 mg by mouth once daily  . bisacodyL (DULCOLAX) 5 mg EC tablet Take by mouth  . budesonide-glycopyrrolate-formoterol (BREZTRI AEROSPHERE) 160-9-4.8 mcg/actuation inhaler Inhale 2 inhalations into the lungs 2 (two) times daily  . cetirizine (ZYRTEC) 10 MG tablet Take by mouth  . clotrimazole (LOTRIMIN) 1 % cream Apply topically  . cyclobenzaprine (FLEXERIL) 5 MG tablet Take 2 tablets (10 mg total) by mouth 3 (three) times daily as needed for Muscle spasms 60 tablet 0  . diclofenac (VOLTAREN) 1 % topical gel AS DIRECTED TO SHOULDER IF NOT USING ORAL MELOXICAM EVERY 8 HOURS EXTERNALLY 30 DAYS  . dicyclomine (BENTYL) 10 mg capsule TAKE 1 CAPSULE (10 MG TOTAL) BY MOUTH EVERY 6 (SIX) HOURS AS NEEDED 120 capsule 1  . diphenhydrAMINE (BENADRYL) 25 mg tablet Take by mouth  . ergocalciferol, vitamin D2, 1,250 mcg (50,000 unit) capsule  Take by mouth  . fluticasone propionate (FLONASE) 50 mcg/actuation nasal spray SPRAY 2 IN EACH NOSTRIL ONCE A DAY  . hydrocortisone 2.5 % cream Apply topically  . ipratropium-albuteroL (DUO-NEB) nebulizer solution Inhale into the lungs  . metFORMIN (GLUCOPHAGE) 1000 MG tablet Take 1,000 mg by mouth 2 (two) times daily with meals  . naproxen (NAPROSYN) 500 MG tablet Take 1 tablet (500 mg total) by mouth 2 (two) times daily with meals for 90 days 60 tablet 2  . pantoprazole (PROTONIX) 40 MG DR tablet Take 1 tablet (40 mg total) by mouth once daily Take 15-20 mins before meal 180 tablet 1  . predniSONE (DELTASONE) 5 MG tablet Take as directed - 6 day taper 21 tablet 0  . TRADJENTA 5 mg tablet Take 5 mg by mouth every morning   Allergies:  . Flagyl [Metronidazole] Diarrhea  . Penicillin Hives   Past Medical History:  . Asthma, unspecified asthma severity, unspecified whether complicated, unspecified whether persistent  . Diabetes mellitus without complication (CMS-HCC)  . GERD (gastroesophageal reflux disease)  . Hyperlipidemia  . Hypertension  . Seizures (CMS-HCC)  . Sleep apnea 2019   Past Surgical History:  . ARTHROSCOPIC ROTATOR CUFF REPAIR Left  . foot surgery Bilateral  . KNEE ARTHROSCOPY   Past Medical History: History reviewed. No pertinent family history.   Social History:   Socioeconomic History:  Marland Kitchen Marital status: Married  Spouse name: Not on file  . Number of children: Not on file  . Years of education: Not on file  . Highest education level: Not on file  Occupational History  . Not on file  Tobacco Use  . Smoking status: Current Every Day Smoker  Packs/day: 0.00  Years: 0.00  Pack years: 0.00  Types: Cigarettes  Start date: 08/22/1980  . Smokeless tobacco: Never Used  Vaping Use  . Vaping Use: Never used  Substance and Sexual Activity  . Alcohol use: Never  . Drug use: Never  . Sexual activity: Yes  Partners: Male  Other Topics Concern  . Not on file   Social History Narrative  . Not on file   Social Determinants of Health:   Financial Resource Strain: Not on file  Food Insecurity: Not on file  Transportation Needs: Not on file   Review of Systems:  A comprehensive 14 point ROS was performed, reviewed, and the pertinent orthopaedic findings are documented in the HPI.  Physical Exam: Vitals:  02/17/20 0901 02/17/20 0903  BP: 132/88  Weight: (!) 105 kg (231 lb 6.4 oz)  Height: 160 cm (5\' 3" )  PainSc: 8 8  PainLoc: Shoulder Shoulder   General/Constitutional: Pleasant significantly overweight middle-aged female in no acute distress. Neuro/Psych: Normal mood and affect, oriented to person, place and time. Eyes: Non-icteric. Pupils are equal, round, and reactive to light, and exhibit synchronous movement. ENT: Unremarkable. Lymphatic: No palpable adenopathy. Respiratory: Lungs clear to auscultation, Normal chest excursion, No wheezes and Non-labored breathing Cardiovascular: Regular rate and rhythm. No murmurs. and No edema, swelling or tenderness, except as noted in detailed exam. Integumentary: No impressive skin lesions present, except as noted in detailed exam. Musculoskeletal: Unremarkable, except as noted in detailed exam.  Left shoulder exam: SKIN: Well-healed anterolateral surgical incision, otherwise unremarkable. SWELLING: None WARMTH: None LYMPH NODES: No adenopathy palpable CREPITUS: None TENDERNESS: Mildly tender over anterolateral shoulder ROM (active):  Forward flexion: 60 degrees Abduction: 55 degrees Internal rotation: Left buttock ROM (passive):  Forward flexion: 120 degrees Abduction: 105 degrees ER/IR at 90 abd: 70 degrees/55 degrees  She experiences moderate pain with all motions.  STRENGTH: Forward flexion: 3/5 Abduction: 3/5 External rotation: 3+-4/5 Internal rotation: 4-4+/5 Pain with RC testing: Moderate pain with resisted forward flexion and abduction more so than with resisted external  rotation.  STABILITY: Normal  SPECIAL TESTS: ' test: Moderately positive Speed's test: Not evaluated Capsulitis - pain w/ passive ER: No Crossed arm test: Mildly positive Crank: Not evaluated Anterior apprehension: Negative Posterior apprehension: Not evaluated   She remains neurovascularly intact to the left upper extremity and hand.  X-rays/MRI/Lab data:  A recent MRI scan of the left shoulder is available for review and has been reviewed by myself. By report, the scan demonstrates evidence of a recurrent partial-thickness tear involving the supraspinatus tendon, as well as some "low-grade partial-thickness tearing at the insertion of the infraspinatus tendon". The infraspinatus muscle reremains "severely atrophic" with "extensive fatty infiltration". There is evidence of early degenerative changes of the glenohumeral joint with diffuse circumferential degenerative fraying of the labrum. Both the films and report were reviewed by myself and discussed with the patient.  Assessment: 1. Rotator cuff tendinitis, left  2. Status post left rotator cuff repair  3. Nontraumatic incomplete tear of left rotator cuff   Plan: The treatment options were discussed with the patient. In addition, patient educational materials were provided regarding the diagnosis and treatment options. The patient is quite frustrated by her symptoms and functional mentation, and is ready to consider more aggressive treatment options. We discussed a repeat left shoulder arthroscopy with debridement, decompression, repair of the recurrent rotator cuff tear.  The patient is concerned that she will end up in the same situation that she is in. Furthermore, given the severe atrophy of the infraspinatus muscle, I cannot guarantee that she will be able to regain overhead motion. Therefore, the patient would like to proceed with a reverse total shoulder arthroplasty. The procedure was discussed with the patient, as were the  potential risks (including bleeding, infection, nerve and/or blood vessel injury, persistent or recurrent pain, loosening and/or failure of the components, dislocation, need for further surgery, blood clots, strokes, heart attacks and/or arhythmias, pneumonia, etc.) and benefits. The patient states her understanding and wishes to proceed. All of the patient's questions and concerns were answered. She can call any time with further concerns. She will follow up post-surgery, routine.   H&P reviewed and patient re-examined. No changes.

## 2020-04-02 NOTE — Plan of Care (Signed)
  Problem: Clinical Measurements: Goal: Ability to maintain clinical measurements within normal limits will improve Outcome: Progressing   

## 2020-04-02 NOTE — Op Note (Signed)
04/02/2020  1:36 PM  Patient:   Alyssa Patrick  Pre-Op Diagnosis:   Impingement/tendinopathy with recurrent partial-thickness rotator cuff tear and extensive atrophy of infraspinatus muscle, left shoulder.  Post-Op Diagnosis:   Same with moderate degenerative joint disease, left shoulder.  Procedure:   Reverse left total shoulder arthroplasty.  Surgeon:   Maryagnes Amos, MD  Assistant:   Horris Latino, PA-C  Anesthesia:   General endotracheal with an interscalene block using Exparel placed preoperatively by the anesthesiologist.  Findings:   As above.  Complications:   None  EBL:    100 cc  Fluids:   1100 cc crystalloid  UOP:   None  TT:   None  Drains:   None  Closure:   Staples  Implants:   All press-fit Biomet Comprehensive system with a #9 micro-humeral stem, a 40 mm +6 mm offset humeral tray with a standard insert, and a mini-base plate with a 36 mm glenosphere.  Brief Clinical Note:   The patient is a 60 year old female with a long history of gradually worsening pain and weakness of her right shoulder. Her symptoms have progressed despite medications, activity modification, etc. Her history and examination are consistent with a recurrent rotator cuff tear as confirmed by preoperative MRI scan. The patient presents at this time for a reverse left total shoulder arthroplasty.  Procedure:   The patient underwent placement of an interscalene block using Exparel by the anesthesiologist in the preoperative holding area before being brought into the operating room and lain in the supine position. The patient then underwent general endotracheal intubation and anesthesia before the patient was repositioned in the beach chair position using the beach chair positioner. The left shoulder and upper extremity were prepped with ChloraPrep solution before being draped sterilely. Preoperative antibiotics were administered.   A standard anterior approach to the shoulder was made through an  approximately 4-5 inch incision. The incision was carried down through the subcutaneous tissues to expose the deltopectoral fascia. The interval between the deltoid and pectoralis muscles was identified and this plane developed, retracting the cephalic vein laterally with the deltoid muscle. The conjoined tendon was identified. Its lateral margin was dissected and the Kolbel self-retraining retractor inserted. The "three sisters" were identified and cauterized. Bursal tissues were removed to improve visualization. The subscapularis tendon was released from its attachment to the lesser tuberosity 1 cm proximal to its insertion and several tagging sutures placed. The inferior capsule was released with care after identifying and protecting the axillary nerve. The proximal humeral cut was made at approximately 25 of retroversion using the extra-medullary guide.   Attention was redirected to the glenoid. The labrum was debrided circumferentially before the center of the glenoid was marked with electrocautery. The guidewire was drilled into the glenoid neck using the appropriate guide. After verifying its position, it was overreamed with the mini-baseplate reamer to create a flat surface. The permanent mini-baseplate was impacted into place. It was stabilized with a 25 x 6.5 mm central screw and four peripheral screws. Locking screws were placed superiorly, posteriorly, and inferiorly while a nonlocking screw was placed anteriorly and posteriorly. The permanent 36 mm glenosphere was then impacted into place and its Morse taper locking mechanism verified using manual distraction.  Attention was directed to the humeral side. The humeral canal was reamed sequentially beginning with the end-cutting reamer then progressing from a 4 mm reamer up to a 9 mm reamer. This provided excellent circumferential chatter. The canal was broached beginning with a #7  broach and progressing to a #9 broach. This was left in place and a  trial reduction performed using the standard trial humeral platform the glenohumeral joint appeared be quite tight with this combination. Therefore, rather than cutting more bone, it was felt best to trial the +6 mm offset humeral platform with the standard thickness bearing. With this combination, the arm demonstrated excellent range of motion as the hand could be brought across the chest to the opposite shoulder and brought to the top of the patient's head and to the patient's ear. The shoulder appeared stable throughout this range of motion.   The joint was dislocated and the trial components removed. The permanent #9 micro-stem was impacted into place with care taken to maintain the appropriate version. The permanent 40 mm humeral platform with the standard insert was put together on the back table and impacted into place. Again, the [facility] taper locking mechanism was verified using manual distraction. The shoulder was relocated using two finger pressure and again placed through a range of motion with the findings as described above.  The wound was copiously irrigated with sterile saline solution using the jet lavage system before a total of 20 cc of Exparel diluted out to 60 cc with normal saline and 30 cc of 0.5% Sensorcaine with epinephrine was injected into the pericapsular and peri-incisional tissues to help with postoperative analgesia. The subscapularis tendon was reapproximated using #2 FiberWire interrupted sutures. The deltopectoral interval was closed using #0 Vicryl interrupted sutures before the subcutaneous tissues were closed using 2-0 Vicryl interrupted sutures. The skin was closed using staples. Prior to closing the skin, 1 g of transexemic acid in 10 cc of normal saline was injected intra-articularly to help with postoperative bleeding. A sterile occlusive dressing was applied to the wound before the arm was placed into a shoulder immobilizer with an abduction pillow. A Polar Care system  also was applied to the shoulder. The patient was then transferred back to a hospital bed before being awakened, extubated, and returned to the recovery room in satisfactory condition after tolerating the procedure well.

## 2020-04-02 NOTE — Discharge Instructions (Signed)
Diet: As you were doing prior to hospitalization   Shower:  May shower but keep the wounds dry, use an occlusive plastic wrap, NO SOAKING IN TUB.  If the bandage gets wet, change with a clean dry gauze.  Dressing:  Leave dressing intact until first post-op appointment.    Activity:  Increase activity slowly as tolerated, but follow the weight bearing instructions below.  No lifting or driving for 6 weeks.  Weight Bearing:   Non-weightbearing to the left arm.  Blood Clot Prevention: Begin taking 1 325mg  aspirin daily starting the day after surgery.  To prevent constipation: you may use a stool softener such as -  Colace (over the counter) 100 mg by mouth twice a day  Drink plenty of fluids (prune juice may be helpful) and high fiber foods Miralax (over the counter) for constipation as needed.    Itching:  If you experience itching with your medications, try taking only a single pain pill, or even half a pain pill at a time.  You may take up to 10 pain pills per day, and you can also use benadryl over the counter for itching or also to help with sleep.   Precautions:  If you experience chest pain or shortness of breath - call 911 immediately for transfer to the hospital emergency department!!  If you develop a fever greater that 101 F, purulent drainage from wound, increased redness or drainage from wound, or calf pain-Call [facility]                                              Follow- Up Appointment:  Please call for an appointment to be seen in 2 weeks at [facility]

## 2020-04-02 NOTE — Anesthesia Procedure Notes (Signed)
Anesthesia Regional Block: Interscalene brachial plexus block   Pre-Anesthetic Checklist: ,, timeout performed, Correct Patient, Correct Site, Correct Laterality, Correct Procedure, Correct Position, site marked, Risks and benefits discussed,  Surgical consent,  Pre-op evaluation,  At surgeon's request and post-op pain management  Laterality: Upper and Left  Prep: chloraprep       Needles:  Injection technique: Single-shot  Needle Type: Echogenic Stimulator Needle     Needle Length: 10cm  Needle Gauge: 21   Needle insertion depth: 7 cm   Additional Needles:   Procedures: Doppler guided,,,, ultrasound used (permanent image in chart),,,,  Motor weakness within 6 minutes.  Narrative:  Start time: 04/02/2020 9:26 AM End time: 04/02/2020 9:29 AM Injection made incrementally with aspirations every 5 mL.  Performed by: Personally  Anesthesiologist: Christia Reading, MD  Additional Notes: Functioning IV was confirmed and O2 [place]/monitors were applied. Light sedation administered as required, patient responsive throughout. A 21ga EchoStim needle was used. Sterile prep and drape,hand hygiene and sterile gloves were used.  Negative aspiration and negative test dose prior to incremental administration of local anesthetic. 1% Lidocaine for skin wheal, 3 ml. Total LA: 38ml - Exparel 95ml & 0.5% Bupivicaine 55ml. U/S images stored in chart. The patient tolerated the procedure well.

## 2020-04-02 NOTE — Transfer of Care (Signed)
Immediate Anesthesia Transfer of Care Note  Patient: Alyssa Patrick  Procedure(s) Performed: REVERSE SHOULDER ARTHROPLASTY (Left Shoulder)  Patient Location: PACU  Anesthesia Type:General  Level of Consciousness: sedated  Airway & Oxygen Therapy: Patient Spontanous Breathing and Patient connected to face mask oxygen  Post-op Assessment: Report given to RN and Post -op Vital signs reviewed and stable  Post vital signs: Reviewed and stable  Last Vitals:  Vitals Value Taken Time  BP 130/90 04/02/20 1400  Temp 36.8 C 04/02/20 1351  Pulse 75 04/02/20 1404  Resp 15 04/02/20 1404  SpO2 100 % 04/02/20 1404  Vitals shown include unvalidated device data.  Last Pain:  Vitals:   04/02/20 1351  TempSrc:   PainSc: Asleep         Complications: No complications documented.

## 2020-04-03 ENCOUNTER — Encounter: Payer: Self-pay | Admitting: Surgery

## 2020-04-03 LAB — BASIC METABOLIC PANEL
Anion gap: 8 (ref 5–15)
BUN: 13 mg/dL (ref 8–23)
CO2: 23 mmol/L (ref 22–32)
Calcium: 8.6 mg/dL — ABNORMAL LOW (ref 8.9–10.3)
Chloride: 107 mmol/L (ref 98–111)
Creatinine, Ser: 0.87 mg/dL (ref 0.44–1.00)
GFR, Estimated: >60 mL/min (ref >60)
Glucose, Bld: 203 mg/dL — ABNORMAL HIGH (ref 70–99)
Potassium: 4.1 mmol/L (ref 3.5–5.1)
Sodium: 138 mmol/L (ref 135–145)

## 2020-04-03 LAB — GLUCOSE, CAPILLARY: Glucose-Capillary: 218 mg/dL — ABNORMAL HIGH (ref 70–99)

## 2020-04-03 NOTE — Plan of Care (Signed)

## 2020-04-03 NOTE — Progress Notes (Signed)
  Subjective: 1 Day Post-Op Procedure(s) (LRB): REVERSE SHOULDER ARTHROPLASTY (Left) Patient reports pain as mild.   Patient is well, and has had no acute complaints or problems Plan is to go Home after hospital stay. Negative for chest pain and shortness of breath Fever: no Gastrointestinal: Negative for nausea and vomiting  Objective: Vital signs in last 24 hours: Temp:  [98.1 F (36.7 C)-98.9 F (37.2 C)] 98.3 F (36.8 C) (04/01 0521) Pulse Rate:  [76-103] 87 (04/01 0521) Resp:  [13-20] 20 (04/01 0521) BP: (127-144)/(81-98) 127/81 (04/01 0521) SpO2:  [94 %-100 %] 95 % (04/01 0521)  Intake/Output from previous day:  Intake/Output Summary (Last 24 hours) at 04/03/2020 0626 Last data filed at 04/03/2020 0420 Gross per 24 hour  Intake 1580 ml  Output 250 ml  Net 1330 ml    Intake/Output this shift: Total I/O In: 200 [IV Piggyback:200] Out: -   Labs: No results for input(s): HGB in the last 72 hours. No results for input(s): WBC, RBC, HCT, PLT in the last 72 hours. Recent Labs    04/03/20 0424  NA 138  K 4.1  CL 107  CO2 23  BUN 13  CREATININE 0.87  GLUCOSE 203*  CALCIUM 8.6*   No results for input(s): LABPT, INR in the last 72 hours.   EXAM General - Patient is Alert and Oriented Extremity - Neurovascular intact Sensation intact distally Dorsiflexion/Plantar flexion intact Dressing/Incision - clean, dry, no drainage Motor Function - intact, moving fingers and wrist well on exam.  Able to give posterior pressure with elbow.  Past Medical History:  Diagnosis Date  . Asthma   . Diabetes (HCC)   . GERD (gastroesophageal reflux disease)   . High blood pressure   . Sleep apnea   . Tobacco use     Assessment/Plan: 1 Day Post-Op Procedure(s) (LRB): REVERSE SHOULDER ARTHROPLASTY (Left) Active Problems:   Status post reverse arthroplasty of shoulder, left  Estimated body mass index is 39.79 kg/m as calculated from the following:   Height as of 03/24/20:  5\' 3"  (1.6 m).   Weight as of 03/24/20: 101.9 kg. Advance diet Up with therapy D/C IV fluids  Plan to discharge home today after physical therapy. DVT Prophylaxis - Lovenox Shoulder immobilizer to the left arm.   03/26/20, PA-C Orthopaedic Surgery 04/03/2020, 6:26 AM

## 2020-04-03 NOTE — Evaluation (Signed)
Physical Therapy Evaluation Patient Details Name: Alyssa Patrick MRN: [id number] DOB: 30-Dec-1960 Today's Date: 04/03/2020   History of Present Illness  Alyssa Patrick is a 60yoF s/p L reverse TSA on 04/02/20. PMHx includes asthma, OSA, bilat TKA, HTN, GERD, DM, and prior L RTC repair.  Clinical Impression  Pt admitted with above diagnosis. Pt currently with functional limitations due to the deficits listed below (see "PT Problem List"). Upon entry, pt in bed, awake and agreeable to participate. OT evaluation concluded with utmost success. The pt is alert, pleasant, interactive, and able to provide info regarding prior level of function, both in tolerance and independence.  Pt circumnavigates the 1A nursing station twice per preference, subsequent fatigue thereafter, mild unsteadiness initially which improves over time, no frank LOB, pt is conversational throughout, denies pain now s/p 'percocet' x2. Patient's performance this date reveals modified independence and fair to good tolerance in performing all basic mobility required for performance of activities of daily living. All education completed, and time is given to address all questions/concerns. No additional skilled PT services needed at this time, PT signing off. PT recommends daily ambulation ad lib or with nursing staff as needed to prevent deconditioning.      Follow Up Recommendations Follow surgeon's recommendation for DC plan and follow-up therapies;Supervision - Intermittent    Equipment Recommendations  None recommended by PT    Recommendations for Other Services       Precautions / Restrictions Precautions Precautions: Fall;Shoulder Type of Shoulder Precautions: no shoulder AROM, elbow/wrist/hand ok Shoulder Interventions: Shoulder sling/immobilizer;Shoulder abduction pillow;At all times;Off for dressing/bathing/exercises Precaution Booklet Issued: Yes (comment) Restrictions Weight Bearing Restrictions: Yes LUE Weight Bearing:  Non weight bearing      Mobility  Bed Mobility               General bed mobility comments: standing at bedside with OT at entry.    Transfers Overall transfer level: Needs assistance Equipment used: None                Ambulation/Gait   Gait Distance (Feet): 400 Feet Assistive device: None   Gait velocity: 0.64m/s      Stairs            Wheelchair Mobility    Modified Rankin (Stroke Patients Only)       Balance Overall balance assessment: No apparent balance deficits (not formally assessed)                                           Pertinent Vitals/Pain Pain Assessment: No/denies pain    Home Living Family/patient expects to be discharged to:: Private residence Living Arrangements: Spouse/significant other Available Help at Discharge: Family;Available PRN/intermittently (spouse works nights and they have a plan to manage polar care and sling and stockings around spouse's work schedul) Type of Home: House Home Access: Level entry     Home Layout: One level;Laundry or work area in [employer] Equipment: Information systems manager - built in;Cane - single point      Prior Function Level of Independence: Independent               Hand Dominance   Dominant Hand: Right    Extremity/Trunk Assessment   Upper Extremity Assessment Upper Extremity Assessment: RUE deficits/detail;LUE deficits/detail RUE Deficits / Details: WFL LUE Deficits / Details: decreased sensation, some bicep activation, fair grip LUE: Unable to fully assess  due to immobilization LUE Sensation: decreased light touch;decreased proprioception LUE Coordination: decreased fine motor;decreased gross motor    Lower Extremity Assessment Lower Extremity Assessment: Overall WFL for tasks assessed    Cervical / Trunk Assessment Cervical / Trunk Assessment: Normal  Communication   Communication: No difficulties  Cognition Arousal/Alertness:  Awake/alert Behavior During Therapy: WFL for tasks assessed/performed Overall Cognitive Status: Within Functional Limits for tasks assessed                                        General Comments      Exercises Other Exercises Other Exercises: Pt instructed in polar care mgt, shoulder sling/immobilizer mgt, compression stocking mgt, falls, home/routines modifications, AE/DME, hemi techniques for bathing/dressing; handout provided to support recall and carryover   Assessment/Plan    PT Assessment Patent does not need any further PT services  PT Problem List Decreased activity tolerance       PT Treatment Interventions Therapeutic activities;Therapeutic exercise    PT Goals (Current goals can be found in the Care Plan section)  Acute Rehab PT Goals Patient Stated Goal: go home and get better in time for my grandson's junior high graduation this summer PT Goal Formulation: With patient Time For Goal Achievement: 04/17/20 Potential to Achieve Goals: Good    Frequency     Barriers to discharge        Co-evaluation               AM-PAC PT "6 Clicks" Mobility  Outcome Measure Help needed turning from your back to your side while in a flat bed without using bedrails?: None Help needed moving from lying on your back to sitting on the side of a flat bed without using bedrails?: None Help needed moving to and from a bed to a chair (including a wheelchair)?: None Help needed standing up from a chair using your arms (e.g., wheelchair or bedside chair)?: None Help needed to walk in hospital room?: None Help needed climbing 3-5 steps with a railing? : A Little 6 Click Score: 23    End of Session   Activity Tolerance: Patient tolerated treatment well;No increased pain Patient left: in chair;with call bell/phone within reach Nurse Communication: Mobility status PT Visit Diagnosis: Unsteadiness on feet (R26.81)    Time: 7408-1448 PT Time Calculation (min)  (ACUTE ONLY): 10 min   Charges:   PT Evaluation $PT Eval Low Complexity: 1 Low         10:27 AM, 04/03/20 Rosamaria Lints, PT, DPT Physical Therapist - [facility]  [phone] (ASCOM)    Savino Whisenant C 04/03/2020, 10:16 AM

## 2020-04-03 NOTE — TOC Transition Note (Signed)
Transition of Care [facility]) - CM/SW Discharge Note   Patient Details  Name: Alyssa Patrick MRN: [id number] Date of Birth: 08-01-1960  Transition of Care [facility] CM/SW Contact:  Caryn Section, RN Phone Number: 04/03/2020, 10:53 AM   Clinical Narrative:  TOC in to see patient at bedside to discuss discharge planning.  Recommendation for HHPT, however MD discussed exercises and okay with patient first OP therapy appointment is 04/24/20 due to family conflicts.  Care team aware and amenable.  Patient has no concerns with getting to appointments or getting medications at home.  Voiced no concerns about returning home.  Therapy recommends 3n1, ordered with adapt to be delivered to pt room prior to discharge.  Pt voices no further concerns or questions.  TOC signing off     Final next level of care: Home/Self Care Barriers to Discharge: Barriers Resolved   Patient Goals and CMS Choice     Choice offered to / list presented to : NA  Discharge Placement                  Name of family member notified: Patient notified husband and cousin, who will transport her home. Patient and family notified of of transfer: 04/03/20  Discharge Plan and Services                DME Arranged: 3-N-1 DME Agency: [employer] Date DME Agency Contacted: 04/03/20 Time DME Agency Contacted: 1046 Representative spoke with at DME Agency: Bjorn Loser            Social Determinants of Health (SDOH) Interventions     Readmission Risk Interventions No flowsheet data found.

## 2020-04-03 NOTE — Plan of Care (Signed)
  Problem: Education: Goal: Knowledge of General Education information will improve Description: Including pain rating scale, medication(s)/side effects and non-pharmacologic comfort measures 04/03/2020 1032 by Jean Rosenthal, RN Outcome: Adequate for Discharge 04/03/2020 0809 by Jean Rosenthal, RN Outcome: Progressing   Problem: Health Behavior/Discharge Planning: Goal: Ability to manage health-related needs will improve 04/03/2020 1032 by Jean Rosenthal, RN Outcome: Adequate for Discharge 04/03/2020 0809 by Jean Rosenthal, RN Outcome: Progressing   Problem: Clinical Measurements: Goal: Ability to maintain clinical measurements within normal limits will improve 04/03/2020 1032 by Jean Rosenthal, RN Outcome: Adequate for Discharge 04/03/2020 0809 by Jean Rosenthal, RN Outcome: Progressing Goal: Will remain free from infection 04/03/2020 1032 by Jean Rosenthal, RN Outcome: Adequate for Discharge 04/03/2020 0809 by Jean Rosenthal, RN Outcome: Progressing Goal: Diagnostic test results will improve 04/03/2020 1032 by Jean Rosenthal, RN Outcome: Adequate for Discharge 04/03/2020 0809 by Jean Rosenthal, RN Outcome: Progressing Goal: Respiratory complications will improve 04/03/2020 1032 by Jean Rosenthal, RN Outcome: Adequate for Discharge 04/03/2020 0809 by Jean Rosenthal, RN Outcome: Progressing Goal: Cardiovascular complication will be avoided 04/03/2020 1032 by Jean Rosenthal, RN Outcome: Adequate for Discharge 04/03/2020 0809 by Jean Rosenthal, RN Outcome: Progressing

## 2020-04-03 NOTE — Evaluation (Signed)
Occupational Therapy Evaluation Patient Details Name: Alyssa Patrick MRN: [id number] DOB: [birth year] Today's Date: 04/03/2020    History of Present Illness 60yo female s/p L reverse TSA on 04/02/20. PMHx includes asthma, sleep apnea, HTN, GERD, DM, and hx L RTC repair.   Clinical Impression   Patient was seen for an OT evaluation this date. Pt lives with her spouse (who works night shift) in a level entry 1 story home with basement. Pt active and independent. Pt has orders for LUE to be immobilized and will be NWBing per MD. Patient presents with impaired strength/ROM, pain, and sensation to LUE still returning. These impairments result in a decreased ability to perform self care tasks requiring PRN MIN A for LB dressing and bathing, MIN A for UB bathing and dressing, mod indep for toileting and ADL mobility, and MAX A for application of polar care, compression stockings, and sling/immobilizer. Pt instructed in polar care mgt, compression stockings mgt, sling/immobilizer mgt, ROM exercises for LUE (with instructions for no shoulder exercises until full sensation has returned), LUE precautions, adaptive strategies for bathing/dressing/toileting/grooming, positioning and considerations for sleep, and home/routines modifications to maximize falls prevention, safety, and independence. Handout provided. Helped take pictures of pt's sling/polar care while on from the front/side/back with pt's phone (and with permission) so she has personalized visual reference to show family. OT adjusted sling/immobilizer and polar care to improve comfort, optimize positioning, and to maximize skin integrity/safety. Pt verbalized understanding of all education/training provided. Pt will benefit from skilled OT services to address these limitations and improve independence in daily tasks. Recommend follow up therapy per surgeon's protocol to maximize return to PLOF, address home/routines modifications and safety, minimize falls risk,  and minimize caregiver burden.      Follow Up Recommendations  Follow surgeon's recommendation for DC plan and follow-up therapies    Equipment Recommendations  3 in 1 bedside commode    Recommendations for Other Services       Precautions / Restrictions Precautions Precautions: Fall;Shoulder Type of Shoulder Precautions: no shoulder AROM, elbow/wrist/hand ok Shoulder Interventions: Shoulder sling/immobilizer;Shoulder abduction pillow;At all times;Off for dressing/bathing/exercises Precaution Booklet Issued: Yes (comment) Restrictions Weight Bearing Restrictions: Yes LUE Weight Bearing: Non weight bearing      Mobility Bed Mobility               General bed mobility comments: deferred, pt ambulating in room upon entry, seated EOB at end with PT    Transfers Overall transfer level: Independent Equipment used: None                  Balance Overall balance assessment: No apparent balance deficits (not formally assessed)                                         ADL either performed or assessed with clinical judgement   ADL Overall ADL's : Needs assistance/impaired                                       General ADL Comments: Pt demo's ability to don underwear, pants with mod indep, MIN A for donning shirt overhead and cues for hemi techniques, requires MAX A for compression stockings, polar care, and shoulder slign/immobilizer     Vision Patient Visual Report: No change from baseline  Perception     Praxis      Pertinent Vitals/Pain Pain Assessment: No/denies pain     Hand Dominance Right   Extremity/Trunk Assessment Upper Extremity Assessment Upper Extremity Assessment: RUE deficits/detail;LUE deficits/detail RUE Deficits / Details: WFL LUE Deficits / Details: decreased sensation, some bicep activation, fair grip LUE: Unable to fully assess due to immobilization LUE Sensation: decreased light  touch;decreased proprioception LUE Coordination: decreased fine motor;decreased gross motor   Lower Extremity Assessment Lower Extremity Assessment: Overall WFL for tasks assessed   Cervical / Trunk Assessment Cervical / Trunk Assessment: Normal   Communication Communication Communication: No difficulties   Cognition Arousal/Alertness: Awake/alert Behavior During Therapy: WFL for tasks assessed/performed Overall Cognitive Status: Within Functional Limits for tasks assessed                                     General Comments       Exercises Other Exercises Other Exercises: Pt instructed in polar care mgt, shoulder sling/immobilizer mgt, compression stocking mgt, falls, home/routines modifications, AE/DME, hemi techniques for bathing/dressing; handout provided to support recall and carryover   Shoulder Instructions      Home Living Family/patient expects to be discharged to:: Private residence Living Arrangements: Spouse/significant other Available Help at Discharge: Family;Available PRN/intermittently (spouse works nights and they have a plan to manage polar care and sling and stockings around spouse's work schedul) Type of Home: House Home Access: Level entry     Home Layout: One level;Laundry or work area in basement     [employer]: Producer, television/film/video: Handicapped height     Home Equipment: Information systems manager - built in;Cane - single point          Prior Functioning/Environment Level of Independence: Independent                 OT Problem List: Impaired UE functional use;Decreased knowledge of use of DME or AE      OT Treatment/Interventions: Self-care/ADL training;Therapeutic exercise;Therapeutic activities;DME and/or AE instruction;Patient/family education    OT Goals(Current goals can be found in the care plan section) Acute Rehab OT Goals Patient Stated Goal: go home and get better in time for my grandson's junior  high graduation this summer OT Goal Formulation: With patient Time For Goal Achievement: 04/17/20 Potential to Achieve Goals: Good ADL Goals Additional ADL Goal #1: Pt will independently instruct family/caregiver in polar care mgt Additional ADL Goal #2: Pt will independently instruct family/caregiver in compression stocking mgt Additional ADL Goal #3: Pt will independently instruct family/caregiver in shoulder sling/immobilizer mgt  OT Frequency: Min 2X/week   Barriers to D/C:            Co-evaluation              AM-PAC OT "6 Clicks" Daily Activity     Outcome Measure Help from another person eating meals?: None Help from another person taking care of personal grooming?: None Help from another person toileting, which includes using toliet, bedpan, or urinal?: None Help from another person bathing (including washing, rinsing, drying)?: A Little Help from another person to put on and taking off regular upper body clothing?: A Little Help from another person to put on and taking off regular lower body clothing?: None 6 Click Score: 22   End of Session    Activity Tolerance: Patient tolerated treatment well Patient left: in bed;with call bell/phone within reach;Other (comment) (  seated EOB when PT entered)  OT Visit Diagnosis: Other abnormalities of gait and mobility (R26.89)                Time: 8413-2440 OT Time Calculation (min): 43 min Charges:  OT General Charges $OT Visit: 1 Visit OT Evaluation $OT Eval Moderate Complexity: 1 Mod OT Treatments $Self Care/Home Management : 23-37 mins  Wynona Canes, MPH, MS, OTR/L ascom [phone] 04/03/20, 9:52 AM

## 2020-04-09 NOTE — Anesthesia Postprocedure Evaluation (Signed)
Anesthesia Post Note  Patient: Alyssa Patrick  Procedure(s) Performed: REVERSE SHOULDER ARTHROPLASTY (Left Shoulder)  Patient location during evaluation: PACU Anesthesia Type: General Level of consciousness: awake and alert Pain management: pain level controlled Vital Signs Assessment: post-procedure vital signs reviewed and stable Respiratory status: spontaneous breathing, nonlabored ventilation and respiratory function stable Cardiovascular status: blood pressure returned to baseline and stable Postop Assessment: no apparent nausea or vomiting Anesthetic complications: no   No complications documented.   Last Vitals:  Vitals:   04/03/20 0730 04/03/20 1151  BP: 124/81 124/68  Pulse: 84 79  Resp: 18 17  Temp: 36.6 C 37 C  SpO2: 96% 96%    Last Pain:  Vitals:   04/03/20 1151  TempSrc: Oral  PainSc:                  Christia Reading

## 2020-04-25 ENCOUNTER — Other Ambulatory Visit: Payer: Self-pay | Admitting: Orthopedic Surgery

## 2020-04-25 DIAGNOSIS — Z9889 Other specified postprocedural states: Secondary | ICD-10-CM

## 2020-05-01 ENCOUNTER — Encounter (HOSPITAL_COMMUNITY): Payer: Self-pay | Admitting: Occupational Therapy

## 2020-05-01 ENCOUNTER — Other Ambulatory Visit: Payer: Self-pay

## 2020-05-01 ENCOUNTER — Ambulatory Visit (HOSPITAL_COMMUNITY): Payer: Medicaid Other | Attending: Physical Medicine and Rehabilitation | Admitting: Occupational Therapy

## 2020-05-01 DIAGNOSIS — M25512 Pain in left shoulder: Secondary | ICD-10-CM | POA: Insufficient documentation

## 2020-05-01 DIAGNOSIS — R29898 Other symptoms and signs involving the musculoskeletal system: Secondary | ICD-10-CM | POA: Diagnosis present

## 2020-05-01 DIAGNOSIS — M25612 Stiffness of left shoulder, not elsewhere classified: Secondary | ICD-10-CM | POA: Insufficient documentation

## 2020-05-01 NOTE — Patient Instructions (Signed)

## 2020-05-01 NOTE — Therapy (Signed)
[place] [facility] [address] [place], [place], [address] Phone: [phone]   Fax:  [phone]  Occupational Therapy Evaluation  Patient Details  Name: Alyssa Patrick MRN: [id number] Date of Birth: [birth year] Referring Provider (OT): Dr. Leron Croak   Encounter Date: 05/01/2020   OT End of Session - 05/01/20 0935    Visit Number 1    Number of Visits 8    Date for OT Re-Evaluation 05/31/20    Authorization Type Medicaid-[place] [employer]    Authorization Time Period Requesting 8 visits    Authorization - Visit Number 0    Authorization - Number of Visits 8    OT Start Time 0905    OT Stop Time 0939    OT Time Calculation (min) 34 min    Activity Tolerance Patient tolerated treatment well    Behavior During Therapy [facility] for tasks assessed/performed           Past Medical History:  Diagnosis Date  . Asthma   . Diabetes (HCC)   . GERD (gastroesophageal reflux disease)   . High blood pressure   . Sleep apnea   . Tobacco use     Past Surgical History:  Procedure Laterality Date  . ENDOVENOUS ABLATION SAPHENOUS VEIN W/ LASER    . FOOT SURGERY    . JOINT REPLACEMENT    . REVERSE SHOULDER ARTHROPLASTY Left 04/02/2020   Procedure: REVERSE SHOULDER ARTHROPLASTY;  Surgeon: Christena Flake, MD;  Location: [facility] ORS;  Service: Orthopedics;  Laterality: Left;  . SHOULDER OPEN ROTATOR CUFF REPAIR Left 05/16/2019   Procedure: ROTATOR CUFF REPAIR SHOULDER OPEN;  Surgeon: Vickki Hearing, MD;  Location: [facility];  Service: Orthopedics;  Laterality: Left;  with scalene block     There were no vitals filed for this visit.   Subjective Assessment - 05/01/20 0928    Subjective  S: I haven't had much pain with this surgery.    Pertinent History Pt is a 60 y/o female s/p left reverse TSA on 04/02/20. Pt reports MD provided exercises to complete at shoulder level and below, she has been using her LUE at home during ADLs and wearing sling between  tasks. Pt was referred to occupational therapy for evaluation and treatment by Dr. Leron Croak.    Patient Stated Goals To be able to use my left arm like I want to.    Currently in Pain? No/denies             [facility] OT Assessment - 05/01/20 0906      Assessment   Medical Diagnosis s/p left reverse total shoulder replacement    Referring Provider (OT) Dr. Leron Croak    Onset Date/Surgical Date 04/02/20    Hand Dominance Right    Next MD Visit 05/15/2020    Prior Therapy Acute OT      Precautions   Precautions Shoulder    Type of Shoulder Precautions waiting on protocol from MD    Shoulder Interventions Shoulder sling/immobilizer;Off for dressing/bathing/exercises      Balance Screen   Has the patient fallen in the past 6 months No      Prior Function   Level of Independence Independent    Vocation On disability    Leisure mowing      ADL   ADL comments Pt is having difficulty with overhead reaching, is unable to lift weighted objects, reaching behind back, dressing, bathing and reaching back, sleeping.      Written  Expression   Dominant Hand Right      Cognition   Overall Cognitive Status Within Functional Limits for tasks assessed      ROM / Strength   AROM / PROM / Strength AROM;PROM;Strength      Palpation   Palpation comment Mod fascial restrictions in left upper arm, trapezius, and scapular regions. max scar restrictions      AROM   Overall AROM Comments Assessed seated, er/IR adducted    AROM Assessment Site Shoulder    Right/Left Shoulder Left    Left Shoulder Flexion 105 Degrees    Left Shoulder ABduction 82 Degrees    Left Shoulder Internal Rotation 90 Degrees    Left Shoulder External Rotation 40 Degrees      PROM   Overall PROM Comments Assessed supine, er/IR adducted    PROM Assessment Site Shoulder    Right/Left Shoulder Left    Left Shoulder Flexion 124 Degrees    Left Shoulder ABduction 138 Degrees    Left Shoulder Internal Rotation 90 Degrees     Left Shoulder External Rotation 50 Degrees      Strength   Overall Strength Comments Assessed seated, er/IR adducted    Strength Assessment Site Shoulder    Right/Left Shoulder Left    Left Shoulder Flexion 4/5    Left Shoulder ABduction 4-/5    Left Shoulder Internal Rotation 4/5    Left Shoulder External Rotation 3/5                           OT Education - 05/01/20 0928    Education Details AA/ROM    Person(s) Educated Patient    Methods Explanation;Demonstration;Handout    Comprehension Verbalized understanding;Returned demonstration            OT Short Term Goals - 05/01/20 1013      OT SHORT TERM GOAL #1   Title Pt will be provided with and educated on HEP to improve LUE use during ADLs.    Time 4    Period Weeks    Status New    Target Date 05/31/20      OT SHORT TERM GOAL #2   Title Pt will increase LUE A/ROM to [facility] to improve ability to perform dressing and bathing tasks.    Time 4    Period Weeks    Status New      OT SHORT TERM GOAL #3   Title Pt will increase LUE strength to 4+/5 to improve ability to lift items during cooking and housework tasks.    Time 4    Period Weeks    Status New      OT SHORT TERM GOAL #4   Title Pt will decrease LUE fascial restrictions to minimal amounts to increase mobility required for overhead reaching tasks.    Time 4    Period Weeks    Status New      OT SHORT TERM GOAL #5   Title Pt will decrease LUE pain to 2/10 or less to improve ability to sleep without needing heat or ice overnight.    Time 4    Period Weeks    Status New                    Plan - 05/01/20 0940    Clinical Impression Statement A: Pt is a 60 y/o female s/p left reverse total shoulder arthroplasty on 04/02/20 presenting with deficits limiting functional  use of LUE during ADL completion. Pt reports completion of HEP provided by MD consisting of A/ROM shoulder level and below.    OT Occupational Profile and History  Problem Focused Assessment - Including review of records relating to presenting problem    Occupational performance deficits (Please refer to evaluation for details): ADL's;IADL's;Rest and Sleep;Leisure    Body Structure / Function / Physical Skills ADL;Endurance;UE functional use;Fascial restriction;Pain;ROM;Scar mobility;IADL;Strength    Rehab Potential Good    Clinical Decision Making Limited treatment options, no task modification necessary    Comorbidities Affecting Occupational Performance: None    Modification or Assistance to Complete Evaluation  No modification of tasks or assist necessary to complete eval    OT Frequency 2x / week    OT Duration 4 weeks    OT Treatment/Interventions Self-care/ADL training;Ultrasound;Scar mobilization;Patient/family education;Passive range of motion;Cryotherapy;Electrical Stimulation;Moist Heat;Therapeutic exercise;Manual Therapy;Therapeutic activities    Plan P: Pt will benefit from skilled OT services to decrease pain and fascial restrictions, improve ROM, strength, and functional use of LUE during ADLs. Treatment plan: myofascial release, scar mobilization, manual techniques, P/ROM, AA/ROM, A/ROM, general LUE strengthening, scapular mobility/stability/strengthening, modalities prn    OT Home Exercise Plan eval: AA/ROM    Consulted and Agree with Plan of Care Patient           Patient will benefit from skilled therapeutic intervention in order to improve the following deficits and impairments:   Body Structure / Function / Physical Skills: ADL,Endurance,UE functional use,Fascial restriction,Pain,ROM,Scar mobility,IADL,Strength       Visit Diagnosis: Acute pain of left shoulder  Other symptoms and signs involving the musculoskeletal system  Stiffness of left shoulder, not elsewhere classified    Problem List Patient Active Problem List   Diagnosis Date Noted  . Status post reverse arthroplasty of shoulder, left 04/02/2020  . S/P  arthroscopy of left shoulder 05/16/19 06/24/2019  . Seizure-like activity [facility]) 03/18/2019   Ezra Sites, OTR/L  [phone] 05/01/2020, 10:24 AM  [place] [facility] [address] [place], [place], [address] Phone: [phone]   Fax:  [phone]  Name: Alyssa Patrick MRN: [id number] Date of Birth: June 29, 1960

## 2020-05-04 ENCOUNTER — Other Ambulatory Visit: Payer: Self-pay

## 2020-05-04 ENCOUNTER — Encounter (HOSPITAL_COMMUNITY): Payer: Self-pay

## 2020-05-04 ENCOUNTER — Ambulatory Visit (HOSPITAL_COMMUNITY): Payer: Medicaid Other | Attending: Physical Medicine and Rehabilitation

## 2020-05-04 DIAGNOSIS — R29898 Other symptoms and signs involving the musculoskeletal system: Secondary | ICD-10-CM | POA: Insufficient documentation

## 2020-05-04 DIAGNOSIS — M25512 Pain in left shoulder: Secondary | ICD-10-CM | POA: Diagnosis not present

## 2020-05-04 DIAGNOSIS — M25612 Stiffness of left shoulder, not elsewhere classified: Secondary | ICD-10-CM | POA: Diagnosis present

## 2020-05-04 NOTE — Therapy (Signed)
[place] [facility] [address] [place], [place], [address] Phone: [phone]   Fax:  [phone]  Occupational Therapy Treatment  Patient Details  Name: Alyssa Patrick MRN: [id number] Date of Birth: 01/05/1960 Referring Provider (OT): Dr. Leron Croak   Encounter Date: 05/04/2020   OT End of Session - 05/04/20 1026    Visit Number 2    Number of Visits 8    Date for OT Re-Evaluation 05/31/20    Authorization Type Medicaid-[place] [employer]    Authorization Time Period Requesting 8 visits    Authorization - Visit Number 0    Authorization - Number of Visits 8    OT Start Time 0815    OT Stop Time 0853    OT Time Calculation (min) 38 min    Activity Tolerance Patient tolerated treatment well    Behavior During Therapy [facility] for tasks assessed/performed           Past Medical History:  Diagnosis Date  . Asthma   . Diabetes (HCC)   . GERD (gastroesophageal reflux disease)   . High blood pressure   . Sleep apnea   . Tobacco use     Past Surgical History:  Procedure Laterality Date  . ENDOVENOUS ABLATION SAPHENOUS VEIN W/ LASER    . FOOT SURGERY    . JOINT REPLACEMENT    . REVERSE SHOULDER ARTHROPLASTY Left 04/02/2020   Procedure: REVERSE SHOULDER ARTHROPLASTY;  Surgeon: Christena Flake, MD;  Location: [facility] ORS;  Service: Orthopedics;  Laterality: Left;  . SHOULDER OPEN ROTATOR CUFF REPAIR Left 05/16/2019   Procedure: ROTATOR CUFF REPAIR SHOULDER OPEN;  Surgeon: Vickki Hearing, MD;  Location: [facility];  Service: Orthopedics;  Laterality: Left;  with scalene block     There were no vitals filed for this visit.   Subjective Assessment - 05/04/20 0834    Subjective  S: It will only hurt if you rub on that one muscle that my rotator cuff.    Currently in Pain? No/denies              [facility] OT Assessment - 05/04/20 0835      Assessment   Medical Diagnosis s/p left reverse total shoulder replacement      Precautions    Precautions Shoulder    Type of Shoulder Precautions waiting on protocol from MD                    OT Treatments/Exercises (OP) - 05/04/20 0836      Exercises   Exercises Shoulder      Shoulder Exercises: Supine   Protraction PROM;5 reps    Horizontal ABduction PROM;5 reps    External Rotation PROM;5 reps    Internal Rotation PROM;5 reps    Flexion PROM;5 reps    ABduction PROM;5 reps      Shoulder Exercises: Standing   Protraction AAROM;12 reps    Horizontal ABduction AAROM;12 reps    External Rotation AAROM;12 reps    Internal Rotation AAROM;12 reps    Flexion AAROM;12 reps    ABduction AAROM;12 reps    Other Standing Exercises PVC pipe slide; 12X      Shoulder Exercises: Pulleys   Flexion 1 minute   standing   ABduction 1 minute   standing     Shoulder Exercises: Therapy Ball   Flexion Other (comment)   2" hold at end stretch; 12X   ABduction Other (comment)   2" hold at end stretch;  12X     Manual Therapy   Manual Therapy Myofascial release    Manual therapy comments  Completed separately from therapeutic exercise    Myofascial Release Myofascial release completed to left upper arm, trapezius, and scapularis region to decrease fascial restrictions and increase joint mobility in a pain free zone.                  OT Education - 05/04/20 1025    Education Details reviewed therapy goals. Recommended use of ice when returning home for 5-10 minutes to help with any soreness that may occur from session although patient declined suggestion.    Person(s) Educated Patient    Methods Explanation    Comprehension Verbalized understanding            OT Short Term Goals - 05/04/20 0819      OT SHORT TERM GOAL #1   Title Pt will be provided with and educated on HEP to improve LUE use during ADLs.    Time 4    Period Weeks    Status On-going    Target Date 05/31/20      OT SHORT TERM GOAL #2   Title Pt will increase LUE A/ROM to [facility] to improve  ability to perform dressing and bathing tasks.    Time 4    Period Weeks    Status On-going      OT SHORT TERM GOAL #3   Title Pt will increase LUE strength to 4+/5 to improve ability to lift items during cooking and housework tasks.    Time 4    Period Weeks    Status On-going      OT SHORT TERM GOAL #4   Title Pt will decrease LUE fascial restrictions to minimal amounts to increase mobility required for overhead reaching tasks.    Time 4    Period Weeks    Status On-going      OT SHORT TERM GOAL #5   Title Pt will decrease LUE pain to 2/10 or less to improve ability to sleep without needing heat or ice overnight.    Time 4    Period Weeks    Status On-going                    Plan - 05/04/20 1027    Clinical Impression Statement A: Initiated myofascial release and manual stretching to the left upper arm, trapezius, and scapularis region to address min fascial restrictions palpated. Patient completed standing AA/ROM, pvc pipe slide, pulleys, and therapy ball stretching to work on increasing ROM safely with VC for form and technique provided. Patient provided very limited feedback on feelings of pain or discomfort during exercises. Required to monitor through body language and periodic short rest breaks were provided. Patient does demonstrate functional P/ROM with muscle tightness experienced at higher level range.    Body Structure / Function / Physical Skills ADL;Endurance;UE functional use;Fascial restriction;Pain;ROM;Scar mobility;IADL;Strength    Plan P: Conitnue with AA/ROM. Follow up request to Dr. Joice Lofts for protocol.    Consulted and Agree with Plan of Care Patient           Patient will benefit from skilled therapeutic intervention in order to improve the following deficits and impairments:   Body Structure / Function / Physical Skills: ADL,Endurance,UE functional use,Fascial restriction,Pain,ROM,Scar mobility,IADL,Strength       Visit Diagnosis: Acute  pain of left shoulder  Other symptoms and signs involving the musculoskeletal system  Stiffness of left  shoulder, not elsewhere classified    Problem List Patient Active Problem List   Diagnosis Date Noted  . Status post reverse arthroplasty of shoulder, left 04/02/2020  . S/P arthroscopy of left shoulder 05/16/19 06/24/2019  . Seizure-like activity [facility]) 03/18/2019    Limmie Patricia, OTR/L,CBIS  [phone]  05/04/2020, 10:31 AM   [facility] [address] [place], [place], [address] Phone: [phone]   Fax:  [phone]  Name: Josiephine Simao MRN: [id number] Date of Birth: 1960/09/28

## 2020-05-06 ENCOUNTER — Encounter (HOSPITAL_COMMUNITY): Payer: Medicaid Other | Admitting: Occupational Therapy

## 2020-05-08 ENCOUNTER — Other Ambulatory Visit: Payer: Self-pay

## 2020-05-08 ENCOUNTER — Ambulatory Visit (HOSPITAL_COMMUNITY): Payer: Medicaid Other

## 2020-05-08 ENCOUNTER — Encounter (HOSPITAL_COMMUNITY): Payer: Self-pay

## 2020-05-08 DIAGNOSIS — M25512 Pain in left shoulder: Secondary | ICD-10-CM | POA: Diagnosis not present

## 2020-05-08 DIAGNOSIS — R29898 Other symptoms and signs involving the musculoskeletal system: Secondary | ICD-10-CM

## 2020-05-08 DIAGNOSIS — M25612 Stiffness of left shoulder, not elsewhere classified: Secondary | ICD-10-CM

## 2020-05-08 NOTE — Therapy (Signed)
[place] [facility] [address] [place], [place], [address] Phone: [phone]   Fax:  [phone]  Occupational Therapy Treatment  Patient Details  Name: Alyssa Patrick MRN: [id number] Date of Birth: 1960-04-12 Referring Provider (OT): Dr. Leron Croak   Encounter Date: 05/08/2020   OT End of Session - 05/08/20 1146    Visit Number 3    Number of Visits 8    Date for OT Re-Evaluation 05/31/20    Authorization Type Medicaid-[place] [employer]    Authorization Time Period Requesting 8 visits    Authorization - Visit Number 0    Authorization - Number of Visits 8    OT Start Time 0815    OT Stop Time 0853    OT Time Calculation (min) 38 min    Activity Tolerance Patient tolerated treatment well    Behavior During Therapy [facility] for tasks assessed/performed           Past Medical History:  Diagnosis Date  . Asthma   . Diabetes (HCC)   . GERD (gastroesophageal reflux disease)   . High blood pressure   . Sleep apnea   . Tobacco use     Past Surgical History:  Procedure Laterality Date  . ENDOVENOUS ABLATION SAPHENOUS VEIN W/ LASER    . FOOT SURGERY    . JOINT REPLACEMENT    . REVERSE SHOULDER ARTHROPLASTY Left 04/02/2020   Procedure: REVERSE SHOULDER ARTHROPLASTY;  Surgeon: Christena Flake, MD;  Location: [facility] ORS;  Service: Orthopedics;  Laterality: Left;  . SHOULDER OPEN ROTATOR CUFF REPAIR Left 05/16/2019   Procedure: ROTATOR CUFF REPAIR SHOULDER OPEN;  Surgeon: Vickki Hearing, MD;  Location: [facility];  Service: Orthopedics;  Laterality: Left;  with scalene block     There were no vitals filed for this visit.   Subjective Assessment - 05/08/20 0841    Subjective  S: I have a massive headache.    Currently in Pain? No/denies   reports a severe headache             [facility] OT Assessment - 05/08/20 0844      Assessment   Medical Diagnosis s/p left reverse total shoulder replacement      Precautions   Precautions Shoulder     Type of Shoulder Precautions Following standard protocol. Week 4-6: AA/ROM standing, A/ROM supine. Week 7-8: Wall climbs, A/ROM standing Week 9-11 light strengthening                    OT Treatments/Exercises (OP) - 05/08/20 0847      Exercises   Exercises Shoulder      Shoulder Exercises: Supine   Protraction AROM;12 reps    Horizontal ABduction AROM;12 reps    External Rotation AROM;12 reps    Internal Rotation AROM;12 reps    Flexion AROM;12 reps      Shoulder Exercises: Pulleys   Flexion 1 minute   standing   ABduction 1 minute   standing     Shoulder Exercises: ROM/Strengthening   Wall Wash 1'      Manual Therapy   Manual Therapy Myofascial release    Manual therapy comments  Completed separately from therapeutic exercise    Myofascial Release Myofascial release completed to left upper arm, trapezius, cervical and scapularis region to decrease fascial restrictions and increase joint mobility in a pain free zone. Occipital release completed.  OT Short Term Goals - 05/04/20 9485      OT SHORT TERM GOAL #1   Title Pt will be provided with and educated on HEP to improve LUE use during ADLs.    Time 4    Period Weeks    Status On-going    Target Date 05/31/20      OT SHORT TERM GOAL #2   Title Pt will increase LUE A/ROM to [facility] to improve ability to perform dressing and bathing tasks.    Time 4    Period Weeks    Status On-going      OT SHORT TERM GOAL #3   Title Pt will increase LUE strength to 4+/5 to improve ability to lift items during cooking and housework tasks.    Time 4    Period Weeks    Status On-going      OT SHORT TERM GOAL #4   Title Pt will decrease LUE fascial restrictions to minimal amounts to increase mobility required for overhead reaching tasks.    Time 4    Period Weeks    Status On-going      OT SHORT TERM GOAL #5   Title Pt will decrease LUE pain to 2/10 or less to improve ability to sleep without  needing heat or ice overnight.    Time 4    Period Weeks    Status On-going                    Plan - 05/08/20 1201    Clinical Impression Statement A: Standard protocol followed this session. Patient 5 weeks post op this session. Completed A/ROM supine and completed with no increased pain. VC for form and technique were provided. Added wall wash and continued with pulleys to work on ROM without in pain tolerance. Manual techniques were completed to left upper trapezius, upper arm and also cervical region with occipital release completed to address headache pain. patient reports increase headache relief after manual techniques. Education provided on self myofascial release technique to complete with tennis ball at home.    Body Structure / Function / Physical Skills ADL;Endurance;UE functional use;Fascial restriction;Pain;ROM;Scar mobility;IADL;Strength    Plan P: Continue with A/ROM supine. AA/ROM standing    Consulted and Agree with Plan of Care Patient           Patient will benefit from skilled therapeutic intervention in order to improve the following deficits and impairments:   Body Structure / Function / Physical Skills: ADL,Endurance,UE functional use,Fascial restriction,Pain,ROM,Scar mobility,IADL,Strength       Visit Diagnosis: Stiffness of left shoulder, not elsewhere classified  Other symptoms and signs involving the musculoskeletal system  Acute pain of left shoulder    Problem List Patient Active Problem List   Diagnosis Date Noted  . Status post reverse arthroplasty of shoulder, left 04/02/2020  . S/P arthroscopy of left shoulder 05/16/19 06/24/2019  . Seizure-like activity [facility]) 03/18/2019    Limmie Patricia, OTR/L,CBIS  [phone]  05/08/2020, 12:23 PM  [place] [facility] [address] [place], [place], [address] Phone: [phone]   Fax:  [phone]  Name: Samanta Gal MRN: [id number] Date of Birth:  09-Mar-1960

## 2020-05-11 ENCOUNTER — Ambulatory Visit (HOSPITAL_COMMUNITY): Payer: Medicaid Other

## 2020-05-11 ENCOUNTER — Other Ambulatory Visit: Payer: Self-pay

## 2020-05-11 ENCOUNTER — Encounter (HOSPITAL_COMMUNITY): Payer: Self-pay

## 2020-05-11 DIAGNOSIS — M25512 Pain in left shoulder: Secondary | ICD-10-CM

## 2020-05-11 DIAGNOSIS — M25612 Stiffness of left shoulder, not elsewhere classified: Secondary | ICD-10-CM

## 2020-05-11 DIAGNOSIS — R29898 Other symptoms and signs involving the musculoskeletal system: Secondary | ICD-10-CM

## 2020-05-11 NOTE — Therapy (Signed)
[facility] [address] [place], [place], [address] Phone: [phone]   Fax:  [phone]  Occupational Therapy Treatment  Patient Details  Name: Alyssa Patrick MRN: [id number] Date of Birth: 08-06-1960 Referring Provider (OT): Dr. Leron Croak   Encounter Date: 05/11/2020   OT End of Session - 05/11/20 1054    Visit Number 4    Number of Visits 8    Date for OT Re-Evaluation 05/31/20    Authorization Type Medicaid-[place] [employer]    Authorization Time Period Requesting 8 visits    Authorization - Visit Number 0    Authorization - Number of Visits 8    OT Start Time 0815    OT Stop Time 0853    OT Time Calculation (min) 38 min    Activity Tolerance Patient tolerated treatment well    Behavior During Therapy [facility] for tasks assessed/performed           Past Medical History:  Diagnosis Date  . Asthma   . Diabetes (HCC)   . GERD (gastroesophageal reflux disease)   . High blood pressure   . Sleep apnea   . Tobacco use     Past Surgical History:  Procedure Laterality Date  . ENDOVENOUS ABLATION SAPHENOUS VEIN W/ LASER    . FOOT SURGERY    . JOINT REPLACEMENT    . REVERSE SHOULDER ARTHROPLASTY Left 04/02/2020   Procedure: REVERSE SHOULDER ARTHROPLASTY;  Surgeon: Christena Flake, MD;  Location: [facility] ORS;  Service: Orthopedics;  Laterality: Left;  . SHOULDER OPEN ROTATOR CUFF REPAIR Left 05/16/2019   Procedure: ROTATOR CUFF REPAIR SHOULDER OPEN;  Surgeon: Vickki Hearing, MD;  Location: [facility];  Service: Orthopedics;  Laterality: Left;  with scalene block     There were no vitals filed for this visit.   Subjective Assessment - 05/11/20 0835    Subjective  S: My headache went away and then the other day it came back and I used the tennis ball    Currently in Pain? No/denies              [facility] OT Assessment - 05/11/20 0839      Assessment   Medical Diagnosis s/p left reverse total shoulder replacement       Precautions   Precautions Shoulder    Type of Shoulder Precautions Following standard protocol. Week 4-6: AA/ROM standing, A/ROM supine. Week 7-8: Wall climbs, A/ROM standing Week 9-11 light strengthening                    OT Treatments/Exercises (OP) - 05/11/20 0839      Exercises   Exercises Shoulder      Shoulder Exercises: Supine   Protraction PROM;5 reps;AROM;12 reps    Horizontal ABduction PROM;5 reps;AROM;12 reps    External Rotation PROM;5 reps;AROM;12 reps    Internal Rotation PROM;5 reps;AROM;12 reps    Flexion PROM;5 reps;AROM 12X   ABduction PROM;5 reps;AROM;12 reps      Shoulder Exercises: Standing   Protraction AAROM;15 reps    Horizontal ABduction AAROM;15 reps    External Rotation AAROM;15 reps    Internal Rotation AAROM;15 reps    Flexion AAROM;15 reps    ABduction AAROM;15 reps    Extension Theraband;12 reps    Theraband Level (Shoulder Extension) Level 2 (Red)    Row Theraband;12 reps    Theraband Level (Shoulder Row) Level 2 (Red)      Manual Therapy   Manual Therapy Myofascial release  Manual therapy comments  Completed separately from therapeutic exercise    Myofascial Release Myofascial release completed to left upper arm, trapezius, cervical and scapularis region to decrease fascial restrictions and increase joint mobility in a pain free zone. Occipital release completed.                  OT Education - 05/11/20 1054    Education Details red band - scapular strengthening    Person(s) Educated Patient    Methods Explanation;Demonstration;Verbal cues;Tactile cues;Handout    Comprehension Returned demonstration;Verbalized understanding            OT Short Term Goals - 05/04/20 0819      OT SHORT TERM GOAL #1   Title Pt will be provided with and educated on HEP to improve LUE use during ADLs.    Time 4    Period Weeks    Status On-going    Target Date 05/31/20      OT SHORT TERM GOAL #2   Title Pt will increase LUE  A/ROM to [facility] to improve ability to perform dressing and bathing tasks.    Time 4    Period Weeks    Status On-going      OT SHORT TERM GOAL #3   Title Pt will increase LUE strength to 4+/5 to improve ability to lift items during cooking and housework tasks.    Time 4    Period Weeks    Status On-going      OT SHORT TERM GOAL #4   Title Pt will decrease LUE fascial restrictions to minimal amounts to increase mobility required for overhead reaching tasks.    Time 4    Period Weeks    Status On-going      OT SHORT TERM GOAL #5   Title Pt will decrease LUE pain to 2/10 or less to improve ability to sleep without needing heat or ice overnight.    Time 4    Period Weeks    Status On-going                    Plan - 05/11/20 1056    Clinical Impression Statement A: Manual techniques completed to address fascial restrictions in the LUE. Progressed to scapular strengthening using red resistance band and provided for HEP. VC  for form and technique were provided throughout session.    Body Structure / Function / Physical Skills ADL;Endurance;UE functional use;Fascial restriction;Pain;ROM;Scar mobility;IADL;Strength    Plan P: Continue with A/ROM supine. AA/ROM standing. Follow up on HEP. PVC pipe slide.    OT Home Exercise Plan eval: AA/ROM 5/9: Red band - scapular strengthening    Consulted and Agree with Plan of Care Patient           Patient will benefit from skilled therapeutic intervention in order to improve the following deficits and impairments:   Body Structure / Function / Physical Skills: ADL,Endurance,UE functional use,Fascial restriction,Pain,ROM,Scar mobility,IADL,Strength       Visit Diagnosis: Other symptoms and signs involving the musculoskeletal system  Stiffness of left shoulder, not elsewhere classified  Acute pain of left shoulder    Problem List Patient Active Problem List   Diagnosis Date Noted  . Status post reverse arthroplasty of  shoulder, left 04/02/2020  . S/P arthroscopy of left shoulder 05/16/19 06/24/2019  . Seizure-like activity [facility]) 03/18/2019    Limmie Patricia, OTR/L,CBIS  [phone]  05/11/2020, 11:03 AM  [facility] [address]  [place], [place], [address] Phone: [phone]   Fax:  [phone]  Name: Alyssa Patrick MRN: [id number] Date of Birth: [birth year]

## 2020-05-11 NOTE — Patient Instructions (Signed)
1) (Home) Extension: Isometric / Bilateral Arm Retraction - Sitting   Facing anchor, hold hands and elbow at shoulder height, with elbow bent.  Pull arms back to squeeze shoulder blades together. Repeat 10-15 times. 1-2 times/day.   2) (Clinic) Extension / Flexion (Assist)   Face anchor, pull arms back, keeping elbow straight, and squeze shoulder blades together. Repeat 10-15 times. 1-2 times/day.   Copyright  VHI. All rights reserved.   3) (Home) Retraction: Row - Bilateral (Anchor)   Facing anchor, arms reaching forward, pull hands toward stomach, keeping elbows bent and at your sides and pinching shoulder blades together. Repeat 10-15 times. 1-2 times/day.   Copyright  VHI. All rights reserved.   

## 2020-05-13 ENCOUNTER — Other Ambulatory Visit: Payer: Self-pay

## 2020-05-13 ENCOUNTER — Encounter (HOSPITAL_COMMUNITY): Payer: Self-pay

## 2020-05-13 ENCOUNTER — Ambulatory Visit (HOSPITAL_COMMUNITY): Payer: Medicaid Other

## 2020-05-13 DIAGNOSIS — R29898 Other symptoms and signs involving the musculoskeletal system: Secondary | ICD-10-CM

## 2020-05-13 DIAGNOSIS — M25612 Stiffness of left shoulder, not elsewhere classified: Secondary | ICD-10-CM

## 2020-05-13 DIAGNOSIS — M25512 Pain in left shoulder: Secondary | ICD-10-CM

## 2020-05-14 NOTE — Therapy (Signed)
[place] [facility] [address] [place], [place], [address] Phone: [phone]   Fax:  [phone]  Occupational Therapy Treatment  Patient Details  Name: Alyssa Patrick MRN: [id number] Date of Birth: 1960/03/13 Referring Provider (OT): Dr. Leron Croak   Encounter Date: 05/13/2020   OT End of Session - 05/13/20 0838    Visit Number 5    Number of Visits 8    Date for OT Re-Evaluation 05/31/20    Authorization Type Medicaid-[place] [employer]    Authorization Time Period Requesting 8 visits    Authorization - Visit Number 0    Authorization - Number of Visits 8    OT Start Time 0815    OT Stop Time 0853    OT Time Calculation (min) 38 min    Activity Tolerance Patient tolerated treatment well    Behavior During Therapy [facility] for tasks assessed/performed           Past Medical History:  Diagnosis Date  . Asthma   . Diabetes (HCC)   . GERD (gastroesophageal reflux disease)   . High blood pressure   . Sleep apnea   . Tobacco use     Past Surgical History:  Procedure Laterality Date  . ENDOVENOUS ABLATION SAPHENOUS VEIN W/ LASER    . FOOT SURGERY    . JOINT REPLACEMENT    . REVERSE SHOULDER ARTHROPLASTY Left 04/02/2020   Procedure: REVERSE SHOULDER ARTHROPLASTY;  Surgeon: Christena Flake, MD;  Location: [facility] ORS;  Service: Orthopedics;  Laterality: Left;  . SHOULDER OPEN ROTATOR CUFF REPAIR Left 05/16/2019   Procedure: ROTATOR CUFF REPAIR SHOULDER OPEN;  Surgeon: Vickki Hearing, MD;  Location: [facility];  Service: Orthopedics;  Laterality: Left;  with scalene block     There were no vitals filed for this visit.   Subjective Assessment - 05/13/20 0839    Subjective  S: I'm popping over here (when completing external rotation)    Currently in Pain? No/denies              [facility] OT Assessment - 05/13/20 8828      Assessment   Medical Diagnosis s/p left reverse total shoulder replacement      Precautions   Precautions  Shoulder    Type of Shoulder Precautions Following standard protocol. Week 4-6: AA/ROM standing, A/ROM supine. Week 7-8: Wall climbs, A/ROM standing Week 9-11 light strengthening      AROM   Overall AROM Comments Assessed seated, er/IR adducted    AROM Assessment Site Shoulder    Right/Left Shoulder Left    Left Shoulder Flexion 109 Degrees   previous: 105   Left Shoulder ABduction 101 Degrees   previous: 82   Left Shoulder Internal Rotation 90 Degrees   previous: same   Left Shoulder External Rotation 50 Degrees   previous: 40     PROM   Overall PROM Comments Assessed supine, er/IR adducted    PROM Assessment Site Shoulder    Right/Left Shoulder Left    Left Shoulder Flexion 124 Degrees   previous: 134   Left Shoulder ABduction 143 Degrees   previous: 138   Left Shoulder Internal Rotation 90 Degrees   previous: same   Left Shoulder External Rotation 56 Degrees   preivous: 50     Strength   Overall Strength Comments Assessed seated, er/IR adducted    Strength Assessment Site Shoulder    Right/Left Shoulder Left    Left Shoulder Flexion 4/5   previous: same  Left Shoulder ABduction 4-/5   previous: same   Left Shoulder Internal Rotation 4/5   previous: same   Left Shoulder External Rotation 3+/5   previoua: 3/5                   OT Treatments/Exercises (OP) - 05/13/20 0835      Exercises   Exercises Shoulder      Shoulder Exercises: Supine   Protraction PROM;5 reps    Horizontal ABduction PROM;5 reps    External Rotation PROM;5 reps    Internal Rotation PROM;5 reps    Flexion PROM;5 reps    ABduction PROM;5 reps      Shoulder Exercises: Standing   Protraction AAROM;15 reps    Horizontal ABduction --   too painful today   External Rotation AAROM;15 reps    Internal Rotation AAROM;15 reps    Flexion AAROM;15 reps    ABduction AAROM;15 reps      Shoulder Exercises: Pulleys   Flexion 1 minute   standing     Shoulder Exercises: ROM/Strengthening   Wall  Wash 1'    Other ROM/Strengthening Exercises PVC pipe slide; 15X      Manual Therapy   Manual Therapy Other (comment)    Manual therapy comments  Completed separately from therapeutic exercise    Other Manual Therapy Massage gun used on level 1 with small foam ball attachment. Applied to left upper trapezius to decrease muscle tightness and discomfort after experiencing a muscle cramp.                    OT Short Term Goals - 05/04/20 8144      OT SHORT TERM GOAL #1   Title Pt will be provided with and educated on HEP to improve LUE use during ADLs.    Time 4    Period Weeks    Status On-going    Target Date 05/31/20      OT SHORT TERM GOAL #2   Title Pt will increase LUE A/ROM to [facility] to improve ability to perform dressing and bathing tasks.    Time 4    Period Weeks    Status On-going      OT SHORT TERM GOAL #3   Title Pt will increase LUE strength to 4+/5 to improve ability to lift items during cooking and housework tasks.    Time 4    Period Weeks    Status On-going      OT SHORT TERM GOAL #4   Title Pt will decrease LUE fascial restrictions to minimal amounts to increase mobility required for overhead reaching tasks.    Time 4    Period Weeks    Status On-going      OT SHORT TERM GOAL #5   Title Pt will decrease LUE pain to 2/10 or less to improve ability to sleep without needing heat or ice overnight.    Time 4    Period Weeks    Status On-going                    Plan - 05/13/20 0850    Clinical Impression Statement A: Measurements taken for MD appointment. Patient did not present with any fascial restrictions this date and myofascial release was not needed. During passive shoulder flexion measurements, patient did experience a muscle cramp in her upper trapezius. Massage gun was used at end of session to help decrease muscle cramp and help decrease discomfort. VC for form  and technique were provided during session.    Body Structure / Function  / Physical Skills ADL;Endurance;UE functional use;Fascial restriction;Pain;ROM;Scar mobility;IADL;Strength    Plan P: Follow up on MD appointment. Myofascial release only if needed. Continue with A/ROM supine and AA/ROM standing.    Consulted and Agree with Plan of Care Patient           Patient will benefit from skilled therapeutic intervention in order to improve the following deficits and impairments:   Body Structure / Function / Physical Skills: ADL,Endurance,UE functional use,Fascial restriction,Pain,ROM,Scar mobility,IADL,Strength       Visit Diagnosis: Other symptoms and signs involving the musculoskeletal system  Stiffness of left shoulder, not elsewhere classified  Acute pain of left shoulder    Problem List Patient Active Problem List   Diagnosis Date Noted  . Status post reverse arthroplasty of shoulder, left 04/02/2020  . S/P arthroscopy of left shoulder 05/16/19 06/24/2019  . Seizure-like activity [facility]) 03/18/2019   Limmie Patricia, OTR/L,CBIS  [phone]  05/14/2020, 11:16 AM  [facility] [address] [place], [place], [address] Phone: [phone]   Fax:  [phone]  Name: Alyssa Patrick MRN: [id number] Date of Birth: 1960-04-08

## 2020-05-15 ENCOUNTER — Encounter (HOSPITAL_COMMUNITY): Payer: Medicaid Other

## 2020-05-18 ENCOUNTER — Ambulatory Visit (HOSPITAL_COMMUNITY): Payer: Medicaid Other

## 2020-05-20 ENCOUNTER — Encounter (HOSPITAL_COMMUNITY): Payer: Medicaid Other | Admitting: Occupational Therapy

## 2020-05-22 ENCOUNTER — Other Ambulatory Visit: Payer: Self-pay

## 2020-05-22 ENCOUNTER — Encounter (HOSPITAL_COMMUNITY): Payer: Self-pay | Admitting: Occupational Therapy

## 2020-05-22 ENCOUNTER — Ambulatory Visit (HOSPITAL_COMMUNITY): Payer: Medicaid Other | Admitting: Occupational Therapy

## 2020-05-22 DIAGNOSIS — R29898 Other symptoms and signs involving the musculoskeletal system: Secondary | ICD-10-CM

## 2020-05-22 DIAGNOSIS — M25512 Pain in left shoulder: Secondary | ICD-10-CM

## 2020-05-22 DIAGNOSIS — M25612 Stiffness of left shoulder, not elsewhere classified: Secondary | ICD-10-CM

## 2020-05-22 NOTE — Patient Instructions (Addendum)
  1) Flexion Wall Stretch    Face wall, place affected hand on wall in front of you. Slide hand up the wall  and lean body in towards the wall. Hold for 10 seconds. Repeat 3-5 times. 1-2 times/day.     2) Towel Stretch with Internal Rotation       Gently pull up (or to the side) your affected arm  behind your back with the assist of a towel. Hold 10 seconds, repeat 3-5 times. 1-2 times/day.             3) Corner Stretch    Stand at a corner of a wall, place your arms on the walls with elbows bent. Lean into the corner until a stretch is felt along the front of your chest and/or shoulders. Hold for 10 seconds. Repeat 3-5X, 1-2 times/day.    4) Posterior Capsule Stretch    Bring the involved arm across chest. Grasp elbow and pull toward chest until you feel a stretch in the back of the upper arm and shoulder. Hold 10 seconds. Repeat 3-5X. Complete 1-2 times/day.    5) Scapular Retraction    Tuck chin back as you pinch shoulder blades together.  Hold 5 seconds. Repeat 3-5X. Complete 1-2 times/day.    6) External Rotation Stretch:     Place your affected hand on the wall with the elbow bent and gently turn your body the opposite direction until a stretch is felt. Hold 10 seconds, repeat 3-5X. Complete 1-2 times/day.    A/ROM Exercises  Repeat all exercises 10-15 times, 1-2 times per day.  1) Shoulder Protraction    Begin with elbows by your side, slowly "punch" straight out in front of you.      2) Shoulder Flexion  Supine:     Standing:         Begin with arms at your side with thumbs pointed up, slowly raise both arms up and forward towards overhead.               3) Horizontal abduction/adduction  Supine:   Standing:           Begin with arms straight out in front of you, bring out to the side in at "T" shape. Keep arms straight entire time.                 4) Internal & External Rotation   Supine:      Standing:     Stand with elbows at the side and elbows bent 90 degrees. Move your forearms away from your body, then bring back inward toward the body.     5) Shoulder Abduction  Supine:     Standing:       Lying on your back begin with your arms flat on the table next to your side. Slowly move your arms out to the side so that they go overhead, in a jumping jack or snow angel movement.

## 2020-05-22 NOTE — Therapy (Signed)
[place], [place], [address] Phone: [phone]   Fax:  [phone]  Occupational Therapy Reassessment, Treatment, Discharge Summary  Patient Details  Name: Alyssa Patrick MRN: [id number] Date of Birth: 11/26/1960 Referring Provider (OT): Dr. Milagros Evener   Encounter Date: 05/22/2020   OT End of Session - 05/22/20 0834    Visit Number 6    Number of Visits 8    Date for OT Re-Evaluation 05/31/20    Authorization Type Medicaid-[place] [employer]    Authorization Time Period 8 visits approved 4/29-5/28/22    Authorization - Visit Number 5    Authorization - Number of Visits 8    OT Start Time 0808    OT Stop Time 0833    OT Time Calculation (min) 25 min    Activity Tolerance Patient tolerated treatment well    Behavior During Therapy [facility] for tasks assessed/performed           Past Medical History:  Diagnosis Date  . Asthma   . Diabetes ([place])   . GERD (gastroesophageal reflux disease)   . High blood pressure   . Sleep apnea   . Tobacco use     Past Surgical History:  Procedure Laterality Date  . ENDOVENOUS ABLATION SAPHENOUS VEIN W/ LASER    . FOOT SURGERY    . JOINT REPLACEMENT    . REVERSE SHOULDER ARTHROPLASTY Left 04/02/2020   Procedure: REVERSE SHOULDER ARTHROPLASTY;  Surgeon: Corky Mull, MD;  Location: [facility] ORS;  Service: Orthopedics;  Laterality: Left;  . SHOULDER OPEN ROTATOR CUFF REPAIR Left 05/16/2019   Procedure: ROTATOR CUFF REPAIR SHOULDER OPEN;  Surgeon: Carole Civil, MD;  Location: [facility];  Service: Orthopedics;  Laterality: Left;  with scalene block     There were no vitals filed for this visit.   Subjective Assessment - 05/22/20 0808    Subjective  S: Go easy on me today, I'm sore.    Currently in Pain? Yes    Pain Score 6     Pain Location Shoulder    Pain Orientation Left    Pain Descriptors / Indicators Aching;Sore    Pain Type Chronic pain    Pain Radiating  Towards N/A    Pain Onset More than a month ago    Pain Frequency Intermittent    Aggravating Factors  movement, extended use    Pain Relieving Factors stretching    Effect of Pain on Daily Activities min effect on ADLs    Multiple Pain Sites No              [facility] OT Assessment - 05/22/20 0808      Assessment   Medical Diagnosis s/p left reverse total shoulder replacement      Precautions   Precautions Shoulder    Type of Shoulder Precautions Following standard protocol. Week 4-6: AA/ROM standing, A/ROM supine. Week 7-8: Wall climbs, A/ROM standing Week 9-11 light strengthening      AROM   Overall AROM Comments Assessed seated, er/IR adducted    AROM Assessment Site Shoulder    Right/Left Shoulder Left    Left Shoulder Flexion 130 Degrees   109 previous   Left Shoulder ABduction 125 Degrees   101 previous   Left Shoulder Internal Rotation 90 Degrees   same as previous   Left Shoulder External Rotation 55 Degrees   50 previous     PROM   Overall PROM Comments Assessed supine, er/IR adducted  PROM Assessment Site Shoulder    Right/Left Shoulder Left    Left Shoulder Flexion 126 Degrees   124 previous   Left Shoulder ABduction 155 Degrees   143 previous   Left Shoulder Internal Rotation 90 Degrees   same as previous   Left Shoulder External Rotation 60 Degrees   56 previous     Strength   Overall Strength Comments Assessed seated, er/IR adducted    Strength Assessment Site Shoulder    Right/Left Shoulder Left    Left Shoulder Flexion 4+/5   4/5 previous   Left Shoulder ABduction 4-/5   same as previous   Left Shoulder Internal Rotation 4/5   same as previous   Left Shoulder External Rotation 3+/5   same as previous                   OT Treatments/Exercises (OP) - 05/22/20 9774      Exercises   Exercises Shoulder      Shoulder Exercises: Supine   Protraction PROM;5 reps    Horizontal ABduction PROM;5 reps    External Rotation PROM;5 reps    Internal  Rotation PROM;5 reps    Flexion PROM;5 reps    ABduction PROM;5 reps      Shoulder Exercises: Stretch   Cross Chest Stretch 2 reps;10 seconds    Internal Rotation Stretch 2 reps   10" horizontal towel   External Rotation Stretch 2 reps;10 seconds    Wall Stretch - Flexion 2 reps;10 seconds    Other Shoulder Stretches doorway stretch, 2x10"                  OT Education - 05/22/20 0825    Education Details shoulder stretches, A/ROM    Person(s) Educated Patient    Methods Explanation;Demonstration;Verbal cues;Tactile cues;Handout    Comprehension Returned demonstration;Verbalized understanding            OT Short Term Goals - 05/22/20 0834      OT SHORT TERM GOAL #1   Title Pt will be provided with and educated on HEP to improve LUE use during ADLs.    Time 4    Period Weeks    Status Achieved    Target Date 05/31/20      OT SHORT TERM GOAL #2   Title Pt will increase LUE A/ROM to [facility] to improve ability to perform dressing and bathing tasks.    Time 4    Period Weeks    Status Achieved      OT SHORT TERM GOAL #3   Title Pt will increase LUE strength to 4+/5 to improve ability to lift items during cooking and housework tasks.    Time 4    Period Weeks    Status Not Met      OT SHORT TERM GOAL #4   Title Pt will decrease LUE fascial restrictions to minimal amounts to increase mobility required for overhead reaching tasks.    Time 4    Period Weeks    Status Achieved      OT SHORT TERM GOAL #5   Title Pt will decrease LUE pain to 2/10 or less to improve ability to sleep without needing heat or ice overnight.    Time 4    Period Weeks    Status Achieved                    Plan - 05/22/20 1423    Clinical Impression Statement A: The  doctor was very pleased. Pt requests reassessment and discharge today as she is completing all ADLs, housekeeping, and cooking independently and is having very little pain/soreness. Reassessment completed, pt has met  4/5 goals and is progressing towards remaining goal of increased strength as well as is continuing to improve ROM. Pt completed shoulder stretches this session, added to HEP. Also reviewed A/ROM exercises for HEP. Pt is agreeable to discharge today.    Body Structure / Function / Physical Skills ADL;Endurance;UE functional use;Fascial restriction;Pain;ROM;Scar mobility;IADL;Strength    Plan P: Discharge pt    OT Home Exercise Plan eval: AA/ROM 5/9: Red band - scapular strengthening; 5/20: A/ROM, shoulder stretches    Consulted and Agree with Plan of Care Patient           Patient will benefit from skilled therapeutic intervention in order to improve the following deficits and impairments:   Body Structure / Function / Physical Skills: ADL,Endurance,UE functional use,Fascial restriction,Pain,ROM,Scar mobility,IADL,Strength       Visit Diagnosis: Other symptoms and signs involving the musculoskeletal system  Stiffness of left shoulder, not elsewhere classified  Acute pain of left shoulder    Problem List Patient Active Problem List   Diagnosis Date Noted  . Status post reverse arthroplasty of shoulder, left 04/02/2020  . S/P arthroscopy of left shoulder 05/16/19 06/24/2019  . Seizure-like activity [facility]) 03/18/2019   Guadelupe Sabin, OTR/L  [phone] 05/22/2020, 8:37 AM  [place] [address] [place], [place], [address] Phone: [phone]   Fax:  [phone]  Name: Alyssa Patrick MRN: [id number] Date of Birth: Oct 16, 1960   OCCUPATIONAL THERAPY DISCHARGE SUMMARY  Visits from Start of Care: 5  Current functional level related to goals / functional outcomes: See above. Pt is completing ADLs and housekeeping without difficulty. Reports she is able to cook and modifies lifting tasks as needed. Pt requesting discharge today.   Remaining deficits: Decreased LUE strength, ROM, and activity tolerance   Education / Equipment: HEP  for A/ROM and shoulder stretches Plan: Patient agrees to discharge.  Patient goals were met. Patient is being discharged due to being pleased with the current functional level.  ?????

## 2020-05-25 ENCOUNTER — Encounter (HOSPITAL_COMMUNITY): Payer: Medicaid Other

## 2020-05-27 ENCOUNTER — Encounter (HOSPITAL_COMMUNITY): Payer: Medicaid Other | Admitting: Occupational Therapy

## 2020-05-29 ENCOUNTER — Encounter (HOSPITAL_COMMUNITY): Payer: Medicaid Other

## 2020-06-02 ENCOUNTER — Encounter (HOSPITAL_COMMUNITY): Payer: Medicaid Other | Admitting: Occupational Therapy

## 2020-06-03 ENCOUNTER — Encounter (HOSPITAL_COMMUNITY): Payer: Medicaid Other | Admitting: Occupational Therapy

## 2020-06-05 ENCOUNTER — Encounter (HOSPITAL_COMMUNITY): Payer: Medicaid Other

## 2020-10-22 ENCOUNTER — Other Ambulatory Visit: Payer: Self-pay | Admitting: General Practice

## 2020-10-22 ENCOUNTER — Other Ambulatory Visit: Payer: Self-pay | Admitting: Family Medicine

## 2020-10-22 DIAGNOSIS — Z1231 Encounter for screening mammogram for malignant neoplasm of breast: Secondary | ICD-10-CM

## 2020-11-13 ENCOUNTER — Emergency Department (HOSPITAL_COMMUNITY)
Admission: EM | Admit: 2020-11-13 | Discharge: 2020-11-13 | Disposition: A | Discharge status: Home / Self Care | Payer: Medicaid Other | Attending: Emergency Medicine | Admitting: Emergency Medicine

## 2020-11-13 ENCOUNTER — Encounter (HOSPITAL_COMMUNITY): Payer: Self-pay | Admitting: *Deleted

## 2020-11-13 ENCOUNTER — Other Ambulatory Visit: Payer: Self-pay

## 2020-11-13 DIAGNOSIS — Z96612 Presence of left artificial shoulder joint: Secondary | ICD-10-CM | POA: Insufficient documentation

## 2020-11-13 DIAGNOSIS — Z7984 Long term (current) use of oral hypoglycemic drugs: Secondary | ICD-10-CM | POA: Diagnosis not present

## 2020-11-13 DIAGNOSIS — J45909 Unspecified asthma, uncomplicated: Secondary | ICD-10-CM | POA: Insufficient documentation

## 2020-11-13 DIAGNOSIS — Z79899 Other long term (current) drug therapy: Secondary | ICD-10-CM | POA: Insufficient documentation

## 2020-11-13 DIAGNOSIS — I10 Essential (primary) hypertension: Secondary | ICD-10-CM | POA: Insufficient documentation

## 2020-11-13 DIAGNOSIS — F17210 Nicotine dependence, cigarettes, uncomplicated: Secondary | ICD-10-CM | POA: Diagnosis not present

## 2020-11-13 DIAGNOSIS — Z96653 Presence of artificial knee joint, bilateral: Secondary | ICD-10-CM | POA: Diagnosis not present

## 2020-11-13 DIAGNOSIS — E119 Type 2 diabetes mellitus without complications: Secondary | ICD-10-CM | POA: Diagnosis not present

## 2020-11-13 DIAGNOSIS — K625 Hemorrhage of anus and rectum: Secondary | ICD-10-CM | POA: Diagnosis not present

## 2020-11-13 HISTORY — DX: Sciatica, unspecified side: M54.30

## 2020-11-13 LAB — COMPREHENSIVE METABOLIC PANEL
ALT: 17 U/L (ref 0–44)
AST: 16 U/L (ref 15–41)
Albumin: 3.8 g/dL (ref 3.5–5.0)
Alkaline Phosphatase: 128 U/L — ABNORMAL HIGH (ref 38–126)
Anion gap: 7 (ref 5–15)
BUN: 14 mg/dL (ref 8–23)
CO2: 26 mmol/L (ref 22–32)
Calcium: 8.7 mg/dL — ABNORMAL LOW (ref 8.9–10.3)
Chloride: 108 mmol/L (ref 98–111)
Creatinine, Ser: 0.73 mg/dL (ref 0.44–1.00)
GFR, Estimated: >60 mL/min (ref >60)
Glucose, Bld: 209 mg/dL — ABNORMAL HIGH (ref 70–99)
Potassium: 3.6 mmol/L (ref 3.5–5.1)
Sodium: 141 mmol/L (ref 135–145)
Total Bilirubin: 0.7 mg/dL (ref 0.3–1.2)
Total Protein: 6.4 g/dL — ABNORMAL LOW (ref 6.5–8.1)

## 2020-11-13 LAB — POC OCCULT BLOOD, ED: Fecal Occult Bld: POSITIVE — AB

## 2020-11-13 LAB — TYPE AND SCREEN
ABO/RH(D): O POS
Antibody Screen: NEGATIVE

## 2020-11-13 LAB — CBC
HCT: 38.6 % (ref 36.0–46.0)
Hemoglobin: 12.3 g/dL (ref 12.0–15.0)
MCH: 24.4 pg — ABNORMAL LOW (ref 26.0–34.0)
MCHC: 31.9 g/dL (ref 30.0–36.0)
MCV: 76.4 fL — ABNORMAL LOW (ref 80.0–100.0)
Platelets: 247 10*3/uL (ref 150–400)
RBC: 5.05 MIL/uL (ref 3.87–5.11)
RDW: 15.8 % — ABNORMAL HIGH (ref 11.5–15.5)
WBC: 8.7 10*3/uL (ref 4.0–10.5)
nRBC: 0 % (ref 0.0–0.2)

## 2020-11-13 NOTE — ED Provider Notes (Signed)
[facility] EMERGENCY DEPARTMENT Provider Note   CSN: [id number] Arrival date & time: 11/13/20  1419     History Chief Complaint  Patient presents with   Rectal Bleeding    Alyssa Patrick is a 60 y.o. female.  HPI She presents for evaluation of rectal bleeding which was first noticed this morning when she had a bowel movement.  She has had additional bowel movements, at least 5, each with blood mixed with stool.  She has not had this previously and had a colonoscopy, 5 years ago that she states was normal.  She denies fever, chills, nausea, vomiting, chest pain, weakness or dizziness.  No prior episodes of rectal bleeding.  No other known active modifying factors.    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   S/P arthroscopy of left shoulder 05/16/19 06/24/2019   Seizure-like activity ([place]) 03/18/2019    Past Surgical History:  Procedure Laterality Date   ENDOVENOUS ABLATION SAPHENOUS VEIN W/ LASER     FOOT SURGERY     JOINT REPLACEMENT     REPLACEMENT TOTAL KNEE BILATERAL     REVERSE SHOULDER ARTHROPLASTY Left 04/02/2020   Procedure: REVERSE SHOULDER ARTHROPLASTY;  Surgeon: Corky Mull, MD;  Location: [facility] ORS;  Service: Orthopedics;  Laterality: Left;   SHOULDER OPEN ROTATOR CUFF REPAIR Left 05/16/2019   Procedure: ROTATOR CUFF REPAIR SHOULDER OPEN;  Surgeon: Carole Civil, MD;  Location: [facility] ORS;  Service: Orthopedics;  Laterality: Left;  with scalene block      OB History   No obstetric history on file.     Family History  Problem Relation Age of Onset   Cancer Father    Cancer Brother    Cancer Paternal Aunt    Breast cancer Paternal Aunt 53   Diabetes Maternal Grandmother     Social History    Tobacco Use   Smoking status: Every Day    Packs/day: 0.25    Types: Cigarettes   Smokeless tobacco: Never  Vaping Use   Vaping Use: Never used  Substance Use Topics   Alcohol use: Yes    Comment: occasionally   Drug use: Never    Home Medications Prior to Admission medications   Medication Sig Start Date End Date Taking? Authorizing Provider  acetaminophen (TYLENOL) 325 MG tablet Take 650 mg by mouth every 6 (six) hours as needed for moderate pain.    [provider]  albuterol (VENTOLIN HFA) 108 (90 Base) MCG/ACT inhaler Inhale 1 puff into the lungs every 6 (six) hours as needed for shortness of breath or wheezing.    [provider]  amLODipine (NORVASC) 5 MG tablet Take 5 mg by mouth daily.     [provider]  atorvastatin (LIPITOR) 40 MG tablet Take 40 mg by mouth daily. 02/28/19   [provider]  Budeson-Glycopyrrol-Formoterol (BREZTRI AEROSPHERE) 160-9-4.8 MCG/ACT AERO Inhale 1 puff into the lungs daily.    [provider]  diclofenac (VOLTAREN) 75 MG EC tablet Take 75 mg by mouth 2 (two) times daily.    [provider]  diclofenac Sodium (VOLTAREN) 1 % GEL Apply 1 application topically 3 (three) times daily as  needed (pain.).    [provider]  dicyclomine (BENTYL) 10 MG capsule Take 10 mg by mouth every 6 (six) hours as needed for spasms. 03/09/20   [provider]  diphenhydrAMINE (BENADRYL) 25 MG tablet Take 25 mg by mouth every 6 (six) hours as needed for allergies.    [provider]  famotidine (PEPCID) 40 MG tablet Take 40 mg by mouth daily.    [provider]  fluticasone (FLONASE) 50 MCG/ACT nasal spray Place 2 sprays into both nostrils daily as needed for allergies.  02/26/19   [provider]  glipiZIDE (GLUCOTROL XL) 2.5 MG 24 hr tablet Take 2.5 mg by mouth daily. 02/20/20   [provider]  ibuprofen (ADVIL) 800 MG tablet TAKE 1 TABLET BY MOUTH EVERY 8 HOURS  AS NEEDED Patient not taking: No sig reported 08/22/19   Carole Civil, MD  meloxicam (MOBIC) 7.5 MG tablet Take 7.5 mg by mouth daily as needed for pain. 03/26/19   [provider]  metFORMIN (GLUCOPHAGE-XR) 500 MG 24 hr tablet Take 500 mg by mouth in the morning and at bedtime. 02/28/19   [provider]  naproxen (NAPROSYN) 500 MG tablet Take 500 mg by mouth 2 (two) times daily as needed for moderate pain.    [provider]  oxyCODONE (OXY IR/ROXICODONE) 5 MG immediate release tablet Take 1-2 tablets (5-10 mg total) by mouth every 4 (four) hours as needed for moderate pain (pain score 4-6). 04/02/20   Lattie Corns, PA-C  pantoprazole (PROTONIX) 40 MG tablet Take 40 mg by mouth 2 (two) times daily.    [provider]  promethazine (PHENERGAN) 12.5 MG tablet Take 1 tablet (12.5 mg total) by mouth every 6 (six) hours as needed for nausea or vomiting. Patient not taking: No sig reported 05/16/19   Carole Civil, MD  tiZANidine (ZANAFLEX) 4 MG tablet TAKE 1 TABLET BY MOUTH THREE TIMES A DAY 04/28/20   Carole Civil, MD  TRADJENTA 5 MG TABS tablet Take 5 mg by mouth daily. 12/12/19   [provider]    Allergies    Metronidazole and Penicillin g  Review of Systems   Review of Systems  All other systems reviewed and are negative.  Physical Exam Updated Vital Signs BP (!) 158/106 (BP Location: Right Arm)   Pulse 89   Temp 97.6 F (36.4 C) (Oral)   Resp 16   Ht $R'5\' 3"'Fp$  (1.6 m)   Wt 100.2 kg   SpO2 95%   BMI 39.15 kg/m   Physical Exam Vitals and nursing note reviewed.  Constitutional:      General: She is not in acute distress.    Appearance: She is well-developed. She is obese. She is not ill-appearing, toxic-appearing or diaphoretic.  HENT:     Head: Normocephalic and atraumatic.     Right Ear: External ear normal.     Left Ear: External ear normal.  Eyes:     Conjunctiva/sclera: Conjunctivae normal.     Pupils:  Pupils are equal, round, and reactive to light.  Neck:     Trachea: Phonation normal.  Cardiovascular:     Rate and Rhythm: Normal rate and regular rhythm.     Heart sounds: Normal heart sounds.  Pulmonary:     Effort: Pulmonary effort is normal.     Breath sounds: Normal breath sounds.  Abdominal:     General: There is no distension.     Palpations: Abdomen is soft.  Tenderness: There is no abdominal tenderness. There is no guarding.  Genitourinary:    Comments: Normal anus, no hemorrhoids, no anal fissure.  Thin stool in rectum, slightly melanotic in color. No rectal mass.  No pain on digital rectal examination Musculoskeletal:        General: Normal range of motion.     Cervical back: Normal range of motion and neck supple.  Skin:    General: Skin is warm and dry.  Neurological:     Mental Status: She is alert and oriented to person, place, and time.     Cranial Nerves: No cranial nerve deficit.     Sensory: No sensory deficit.     Motor: No abnormal muscle tone.     Coordination: Coordination normal.  Psychiatric:        Mood and Affect: Mood normal.        Behavior: Behavior normal.        Thought Content: Thought content normal.        Judgment: Judgment normal.    ED Results / Procedures / Treatments   Labs (all labs ordered are listed, but only abnormal results are displayed) Labs Reviewed  COMPREHENSIVE METABOLIC PANEL - Abnormal; Notable for the following components:      Result Value   Glucose, Bld 209 (*)    Calcium 8.7 (*)    Total Protein 6.4 (*)    Alkaline Phosphatase 128 (*)    All other components within normal limits  CBC - Abnormal; Notable for the following components:   MCV 76.4 (*)    MCH 24.4 (*)    RDW 15.8 (*)    All other components within normal limits  POC OCCULT BLOOD, ED  TYPE AND SCREEN    EKG None  Radiology No results found.  Procedures Procedures   Medications Ordered in ED Medications - No data to display  ED  Course  I have reviewed the triage vital signs and the nursing notes.  Pertinent labs & imaging results that were available during my care of the patient were reviewed by me and considered in my medical decision making (see chart for details).    MDM Rules/Calculators/A&P                            Patient Vitals for the past 24 hrs:  BP Temp Temp src Pulse Resp SpO2 Height Weight  11/13/20 1539 (!) 158/106 97.6 F (36.4 C) Oral 89 16 95 % -- --  11/13/20 1433 (!) 151/94 (!) 97.5 F (36.4 C) Oral 91 18 95 % -- --  11/13/20 1430 -- -- -- -- -- -- $Rem'5\' 3"'UilE$  (1.6 m) 100.2 kg    4:08 PM Reevaluation with update and discussion. After initial assessment and treatment, an updated evaluation reveals no change in clinical status, findings discussed with the patient and all questions were answered. Daleen Bo   Medical Decision Making:  This patient is presenting for evaluation of rectal bleeding, which does require a range of treatment options, and is a complaint that involves a moderate risk of morbidity and mortality. The differential diagnoses include gastritis, esophagitis, duodenitis, colitis, bleeding from throughout the gastrointestinal tract, infectious diarrhea. I decided to review old records, and in summary elderly female presenting with rectal bleeding, which is painless.  She has benign presentation without use of anticoagulants, fever, chills or difficulty eating.  She states she had a colonoscopy done 5 years ago which was  normal.  She is mildly hypertensive on arrival.  I did not require additional historical information from anyone.  Clinical Laboratory Tests Ordered, included CBC, Metabolic panel, and blood type, fecal occult blood . Review indicates normal except MCV low, glucose high, calcium low, total protein low, alk phos stays high, fecal occult blood positive.   Critical Interventions-clinical evaluation, laboratory testing, discussion with patient  After These  Interventions, the Patient was reevaluated and was found stable for discharge.  Patient with rectal bleeding, hemodynamically stable, without abdominal pain.  Likely lower GI bleeding, since BUN is normal.  She is stable for discharge with close observation.  I recommend that she have a CBC rechecked in 2 days or 3 days.  She can do that either by return here or go to her PCP/GI provider in 3 days.  She is agreeable to that plan.  She knows to return here for worsening or concerning symptoms  CRITICAL CARE-no Performed by: Daleen Bo  Nursing Notes Reviewed/ Care Coordinated Applicable Imaging Reviewed Interpretation of Laboratory Data incorporated into ED treatment  The patient appears reasonably screened and/or stabilized for discharge and I doubt any other medical condition or other [facility] requiring further screening, evaluation, or treatment in the ED at this time prior to discharge.  Plan: Home Medications-continue usual medication; Home Treatments-rest, fluid; return here if the recommended treatment, does not improve the symptoms; Recommended follow up-recheck CBC in 2 days; return sooner if needed for problems     Final Clinical Impression(s) / ED Diagnoses Final diagnoses:  Rectal bleeding    Rx / DC Orders ED Discharge Orders     None        Daleen Bo, MD 11/13/20 [phone]

## 2020-11-13 NOTE — ED Triage Notes (Addendum)
Pt c/o rectal bleeding, nausea, lower abdominal pain that started this morning. Pt describes it as burgundy in color. Denies use of blood thinners.

## 2020-11-13 NOTE — Discharge Instructions (Signed)
The location of your bleeding is not clear.  It could be anywhere from the stomach, to your colon.  It is important to rest and drink a lot of fluids.  You need to have your blood checked either Sunday or Monday.  Call both your PCP, and your GI doctor to arrange for follow-up appointment to be seen on Monday.  Return here on Sunday if you want to get your blood checked here.  Return sooner if you develop weakness, dizziness, abdominal pain, inability to eat or feel like you are passing too much blood.

## 2020-11-15 ENCOUNTER — Other Ambulatory Visit: Payer: Self-pay

## 2020-11-15 ENCOUNTER — Encounter (HOSPITAL_COMMUNITY): Payer: Self-pay

## 2020-11-15 ENCOUNTER — Emergency Department (HOSPITAL_COMMUNITY)
Admission: EM | Admit: 2020-11-15 | Discharge: 2020-11-15 | Disposition: A | Discharge status: Home / Self Care | Payer: Medicaid Other | Attending: Emergency Medicine | Admitting: Emergency Medicine

## 2020-11-15 DIAGNOSIS — Z79899 Other long term (current) drug therapy: Secondary | ICD-10-CM | POA: Insufficient documentation

## 2020-11-15 DIAGNOSIS — E119 Type 2 diabetes mellitus without complications: Secondary | ICD-10-CM | POA: Diagnosis not present

## 2020-11-15 DIAGNOSIS — K922 Gastrointestinal hemorrhage, unspecified: Secondary | ICD-10-CM | POA: Insufficient documentation

## 2020-11-15 DIAGNOSIS — J45909 Unspecified asthma, uncomplicated: Secondary | ICD-10-CM | POA: Diagnosis not present

## 2020-11-15 DIAGNOSIS — Z7951 Long term (current) use of inhaled steroids: Secondary | ICD-10-CM | POA: Insufficient documentation

## 2020-11-15 DIAGNOSIS — F17210 Nicotine dependence, cigarettes, uncomplicated: Secondary | ICD-10-CM | POA: Insufficient documentation

## 2020-11-15 DIAGNOSIS — Z96612 Presence of left artificial shoulder joint: Secondary | ICD-10-CM | POA: Diagnosis not present

## 2020-11-15 DIAGNOSIS — Z7984 Long term (current) use of oral hypoglycemic drugs: Secondary | ICD-10-CM | POA: Insufficient documentation

## 2020-11-15 LAB — CBC WITH DIFFERENTIAL/PLATELET
Abs Immature Granulocytes: 0.06 10*3/uL (ref 0.00–0.07)
Basophils Absolute: 0.1 10*3/uL (ref 0.0–0.1)
Basophils Relative: 1 %
Eosinophils Absolute: 0.4 10*3/uL (ref 0.0–0.5)
Eosinophils Relative: 6 %
HCT: 39.7 % (ref 36.0–46.0)
Hemoglobin: 12.2 g/dL (ref 12.0–15.0)
Immature Granulocytes: 1 %
Lymphocytes Relative: 34 %
Lymphs Abs: 2.5 10*3/uL (ref 0.7–4.0)
MCH: 23.7 pg — ABNORMAL LOW (ref 26.0–34.0)
MCHC: 30.7 g/dL (ref 30.0–36.0)
MCV: 77.1 fL — ABNORMAL LOW (ref 80.0–100.0)
Monocytes Absolute: 0.7 10*3/uL (ref 0.1–1.0)
Monocytes Relative: 9 %
Neutro Abs: 3.7 10*3/uL (ref 1.7–7.7)
Neutrophils Relative %: 49 %
Platelets: 251 10*3/uL (ref 150–400)
RBC: 5.15 MIL/uL — ABNORMAL HIGH (ref 3.87–5.11)
RDW: 15.9 % — ABNORMAL HIGH (ref 11.5–15.5)
WBC: 7.3 10*3/uL (ref 4.0–10.5)
nRBC: 0 % (ref 0.0–0.2)

## 2020-11-15 LAB — BASIC METABOLIC PANEL
Anion gap: 7 (ref 5–15)
BUN: 12 mg/dL (ref 8–23)
CO2: 27 mmol/L (ref 22–32)
Calcium: 9 mg/dL (ref 8.9–10.3)
Chloride: 106 mmol/L (ref 98–111)
Creatinine, Ser: 0.66 mg/dL (ref 0.44–1.00)
GFR, Estimated: >60 mL/min (ref >60)
Glucose, Bld: 147 mg/dL — ABNORMAL HIGH (ref 70–99)
Potassium: 4.1 mmol/L (ref 3.5–5.1)
Sodium: 140 mmol/L (ref 135–145)

## 2020-11-15 NOTE — ED Provider Notes (Signed)
[facility] EMERGENCY DEPARTMENT Provider Note   CSN: [id number] Arrival date & time: 11/15/20  7829     History Chief Complaint  Patient presents with   Follow-up    Alyssa Patrick is a 60 y.o. female.  Patient presents to ER for repeat evaluation.  She has a history of recent rectal bleeding approximately 2 days ago and was seen here 2 days ago.  She states that the rectal bleeding is stopped while at home but she was advised to get repeat blood work.  She was unable to arrange this on an outpatient basis and presents to the ER.  Otherwise has no complaints.  Denies any rectal bleeding, denies chest pain shortness of breath fever vomiting cough or diarrhea.      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   S/P arthroscopy of left shoulder 05/16/19 06/24/2019   Seizure-like activity ([facility]) 03/18/2019    Past Surgical History:  Procedure Laterality Date   ENDOVENOUS ABLATION SAPHENOUS VEIN W/ LASER     FOOT SURGERY     JOINT REPLACEMENT     REPLACEMENT TOTAL KNEE BILATERAL     REVERSE SHOULDER ARTHROPLASTY Left 04/02/2020   Procedure: REVERSE SHOULDER ARTHROPLASTY;  Surgeon: Christena Flake, MD;  Location: [facility] ORS;  Service: Orthopedics;  Laterality: Left;   SHOULDER OPEN ROTATOR CUFF REPAIR Left 05/16/2019   Procedure: ROTATOR CUFF REPAIR SHOULDER OPEN;  Surgeon: Vickki Hearing, MD;  Location: [facility] ORS;  Service: Orthopedics;  Laterality: Left;  with scalene block      OB History   No obstetric history on file.     Family History  Problem Relation Age of Onset   Cancer Father    Cancer Brother    Cancer Paternal Aunt    Breast cancer Paternal Aunt 45   Diabetes Maternal Grandmother     Social History    Tobacco Use   Smoking status: Every Day    Packs/day: 0.25    Types: Cigarettes   Smokeless tobacco: Never  Vaping Use   Vaping Use: Never used  Substance Use Topics   Alcohol use: Yes    Comment: occasionally   Drug use: Never    Home Medications Prior to Admission medications   Medication Sig Start Date End Date Taking? Authorizing Provider  acetaminophen (TYLENOL) 325 MG tablet Take 650 mg by mouth every 6 (six) hours as needed for moderate pain.    [provider]  albuterol (VENTOLIN HFA) 108 (90 Base) MCG/ACT inhaler Inhale 1 puff into the lungs every 6 (six) hours as needed for shortness of breath or wheezing.    [provider]  amLODipine (NORVASC) 5 MG tablet Take 5 mg by mouth daily.     [provider]  atorvastatin (LIPITOR) 40 MG tablet Take 40 mg by mouth daily. 02/28/19   [provider]  Budeson-Glycopyrrol-Formoterol (BREZTRI AEROSPHERE) 160-9-4.8 MCG/ACT AERO Inhale 1 puff into the lungs daily.    [provider]  diclofenac (VOLTAREN) 75 MG EC tablet Take 75 mg by mouth 2 (two) times daily.    [provider]  diclofenac Sodium (VOLTAREN) 1 % GEL Apply 1 application topically  3 (three) times daily as needed (pain.).    [provider]  dicyclomine (BENTYL) 10 MG capsule Take 10 mg by mouth every 6 (six) hours as needed for spasms. 03/09/20   [provider]  diphenhydrAMINE (BENADRYL) 25 MG tablet Take 25 mg by mouth every 6 (six) hours as needed for allergies.    [provider]  famotidine (PEPCID) 40 MG tablet Take 40 mg by mouth daily.    [provider]  fluticasone (FLONASE) 50 MCG/ACT nasal spray Place 2 sprays into both nostrils daily as needed for allergies.  02/26/19   [provider]  glipiZIDE (GLUCOTROL XL) 2.5 MG 24 hr tablet Take 2.5 mg by mouth daily. 02/20/20   [provider]  ibuprofen (ADVIL) 800 MG tablet TAKE 1 TABLET BY MOUTH EVERY 8 HOURS  AS NEEDED Patient not taking: No sig reported 08/22/19   Vickki Hearing, MD  meloxicam (MOBIC) 7.5 MG tablet Take 7.5 mg by mouth daily as needed for pain. 03/26/19   [provider]  metFORMIN (GLUCOPHAGE-XR) 500 MG 24 hr tablet Take 500 mg by mouth in the morning and at bedtime. 02/28/19   [provider]  naproxen (NAPROSYN) 500 MG tablet Take 500 mg by mouth 2 (two) times daily as needed for moderate pain.    [provider]  oxyCODONE (OXY IR/ROXICODONE) 5 MG immediate release tablet Take 1-2 tablets (5-10 mg total) by mouth every 4 (four) hours as needed for moderate pain (pain score 4-6). 04/02/20   Anson Oregon, PA-C  pantoprazole (PROTONIX) 40 MG tablet Take 40 mg by mouth 2 (two) times daily.    [provider]  promethazine (PHENERGAN) 12.5 MG tablet Take 1 tablet (12.5 mg total) by mouth every 6 (six) hours as needed for nausea or vomiting. Patient not taking: No sig reported 05/16/19   Vickki Hearing, MD  tiZANidine (ZANAFLEX) 4 MG tablet TAKE 1 TABLET BY MOUTH THREE TIMES A DAY 04/28/20   Vickki Hearing, MD  TRADJENTA 5 MG TABS tablet Take 5 mg by mouth daily. 12/12/19   [provider]    Allergies    Metronidazole and Penicillin g  Review of Systems   Review of Systems  Constitutional:  Negative for fever.  HENT:  Negative for ear pain.   Eyes:  Negative for pain.  Respiratory:  Negative for cough.   Cardiovascular:  Negative for chest pain.  Gastrointestinal:  Negative for abdominal pain.  Genitourinary:  Negative for flank pain.  Musculoskeletal:  Negative for back pain.  Skin:  Negative for rash.  Neurological:  Negative for headaches.   Physical Exam Updated Vital Signs BP (!) 172/102   Pulse 66   Temp 97.7 F (36.5 C)   Resp (!) 23   Ht 5\' 3"  (1.6 m)   Wt 100.2 kg   SpO2 97%   BMI 39.15 kg/m   Physical Exam Constitutional:      General: She is not in acute distress.    Appearance: Normal  appearance.  HENT:     Head: Normocephalic.     Nose: Nose normal.  Eyes:     Extraocular Movements: Extraocular movements intact.  Cardiovascular:     Rate and Rhythm: Normal rate.  Pulmonary:     Effort: Pulmonary effort is normal.  Musculoskeletal:        General: Normal range of motion.     Cervical back: Normal range of motion.  Neurological:  General: No focal deficit present.     Mental Status: She is alert. Mental status is at baseline.    ED Results / Procedures / Treatments   Labs (all labs ordered are listed, but only abnormal results are displayed) Labs Reviewed  CBC WITH DIFFERENTIAL/PLATELET - Abnormal; Notable for the following components:      Result Value   RBC 5.15 (*)    MCV 77.1 (*)    MCH 23.7 (*)    RDW 15.9 (*)    All other components within normal limits  BASIC METABOLIC PANEL    EKG None  Radiology No results found.  Procedures Procedures   Medications Ordered in ED Medications - No data to display  ED Course  I have reviewed the triage vital signs and the nursing notes.  Pertinent labs & imaging results that were available during my care of the patient were reviewed by me and considered in my medical decision making (see chart for details).    MDM Rules/Calculators/A&P                           Labs are sent and is unremarkable hemoglobin looks normal chemistry appears similar to prior levels.  Patient advised outpatient follow-up with her physician as needed within the next 1 to 2 weeks.  Advising immediate return if she has recurrent rectal bleeding lightheadedness chest pain or any additional concerns.  Final Clinical Impression(s) / ED Diagnoses Final diagnoses:  Gastrointestinal hemorrhage, unspecified gastrointestinal hemorrhage type    Rx / DC Orders ED Discharge Orders     None        Cheryll Cockayne, MD 11/15/20 [phone]

## 2020-11-15 NOTE — ED Triage Notes (Signed)
Pt seen here 11/11 for rectal bleeding. Pt states that rectal bleeding has stopped and she was told to return here to recheck labs.

## 2020-11-15 NOTE — Discharge Instructions (Signed)
Call your primary care doctor or specialist as discussed in the next 2-3 days.   Return immediately back to the ER if:  Your symptoms worsen within the next 12-24 hours. You develop new symptoms such as new fevers, persistent vomiting, new pain, shortness of breath, or new weakness or numbness, or if you have any other concerns.  

## 2020-11-17 ENCOUNTER — Ambulatory Visit
Admission: RE | Admit: 2020-11-17 | Discharge: 2020-11-17 | Disposition: A | Discharge status: Home / Self Care | Payer: Medicaid Other | Source: Ambulatory Visit | Attending: Family Medicine | Admitting: Family Medicine

## 2020-11-17 ENCOUNTER — Other Ambulatory Visit: Payer: Self-pay

## 2020-11-17 DIAGNOSIS — Z1231 Encounter for screening mammogram for malignant neoplasm of breast: Secondary | ICD-10-CM | POA: Diagnosis not present

## 2021-08-17 ENCOUNTER — Other Ambulatory Visit: Payer: Self-pay

## 2021-08-17 ENCOUNTER — Emergency Department (HOSPITAL_COMMUNITY): Payer: Medicaid Other

## 2021-08-17 ENCOUNTER — Emergency Department (HOSPITAL_COMMUNITY)
Admission: EM | Admit: 2021-08-17 | Discharge: 2021-08-17 | Disposition: A | Discharge status: Home / Self Care | Payer: Medicaid Other | Attending: Emergency Medicine | Admitting: Emergency Medicine

## 2021-08-17 ENCOUNTER — Encounter (HOSPITAL_COMMUNITY): Payer: Self-pay

## 2021-08-17 DIAGNOSIS — R059 Cough, unspecified: Secondary | ICD-10-CM | POA: Insufficient documentation

## 2021-08-17 DIAGNOSIS — J029 Acute pharyngitis, unspecified: Secondary | ICD-10-CM | POA: Diagnosis not present

## 2021-08-17 DIAGNOSIS — H9209 Otalgia, unspecified ear: Secondary | ICD-10-CM | POA: Diagnosis not present

## 2021-08-17 DIAGNOSIS — R0989 Other specified symptoms and signs involving the circulatory and respiratory systems: Secondary | ICD-10-CM | POA: Insufficient documentation

## 2021-08-17 LAB — CBC WITH DIFFERENTIAL/PLATELET
Abs Immature Granulocytes: 0.09 10*3/uL — ABNORMAL HIGH (ref 0.00–0.07)
Basophils Absolute: 0.1 10*3/uL (ref 0.0–0.1)
Basophils Relative: 1 %
Eosinophils Absolute: 0.2 10*3/uL (ref 0.0–0.5)
Eosinophils Relative: 3 %
HCT: 43.6 % (ref 36.0–46.0)
Hemoglobin: 13.2 g/dL (ref 12.0–15.0)
Immature Granulocytes: 1 %
Lymphocytes Relative: 34 %
Lymphs Abs: 3 10*3/uL (ref 0.7–4.0)
MCH: 22.6 pg — ABNORMAL LOW (ref 26.0–34.0)
MCHC: 30.3 g/dL (ref 30.0–36.0)
MCV: 74.7 fL — ABNORMAL LOW (ref 80.0–100.0)
Monocytes Absolute: 0.8 10*3/uL (ref 0.1–1.0)
Monocytes Relative: 9 %
Neutro Abs: 4.8 10*3/uL (ref 1.7–7.7)
Neutrophils Relative %: 52 %
Platelets: 292 10*3/uL (ref 150–400)
RBC: 5.84 MIL/uL — ABNORMAL HIGH (ref 3.87–5.11)
RDW: 17.8 % — ABNORMAL HIGH (ref 11.5–15.5)
WBC: 9 10*3/uL (ref 4.0–10.5)
nRBC: 0 % (ref 0.0–0.2)

## 2021-08-17 LAB — BASIC METABOLIC PANEL
Anion gap: 4 — ABNORMAL LOW (ref 5–15)
BUN: 17 mg/dL (ref 8–23)
CO2: 27 mmol/L (ref 22–32)
Calcium: 8.9 mg/dL (ref 8.9–10.3)
Chloride: 110 mmol/L (ref 98–111)
Creatinine, Ser: 0.77 mg/dL (ref 0.44–1.00)
GFR, Estimated: >60 mL/min (ref >60)
Glucose, Bld: 127 mg/dL — ABNORMAL HIGH (ref 70–99)
Potassium: 4.4 mmol/L (ref 3.5–5.1)
Sodium: 141 mmol/L (ref 135–145)

## 2021-08-17 MED ORDER — IOHEXOL 300 MG/ML  SOLN
75.0000 mL | Freq: Once | INTRAMUSCULAR | Status: AC | PRN
  Administered 2021-08-17: 75 mL via INTRAVENOUS

## 2021-08-17 MED ORDER — SODIUM CHLORIDE 0.9 % IV SOLN: INTRAVENOUS | Status: DC

## 2021-08-17 NOTE — Discharge Instructions (Signed)
Continue taking your usual medications.  There is no sign of pneumonia on the imaging done today.  For cough use Robitussin-DM.  Make sure you are drinking plenty of fluids.  Follow-up with your PCP as needed for problems.

## 2021-08-17 NOTE — ED Triage Notes (Signed)
Patient reports productive cough, sore throat, generalized body aches, runny nose since July 30th. States that she has been treated with ABT/steroids with no improvement. Multiple negative Covid tests.

## 2021-08-17 NOTE — ED Provider Notes (Signed)
[facility] EMERGENCY DEPARTMENT Provider Note   CSN: [id number] Arrival date & time: 08/17/21  6387     History {Add pertinent medical, surgical, social history, OB history to HPI:1} Chief Complaint  Patient presents with   Cough    Alyssa Patrick is a 61 y.o. female.  HPI She presents for evaluation of ongoing symptoms for 2 weeks including cough, sore throat, ear pain, runny nose.  She is coughing up mucus.  Her PCP recommended medications after virtual visit; antibiotics, steroids and cough medicine.  She has completed these treatments but has not had any improvement.  She states "I have taken a COVID test every day and they are negative."  She states her PCP recommended that she come here for a chest x-ray.  Patient is a cigarette smoker.    Home Medications Prior to Admission medications   Medication Sig Start Date End Date Taking? Authorizing Provider  acetaminophen (TYLENOL) 325 MG tablet Take 650 mg by mouth every 6 (six) hours as needed for moderate pain.    [provider]  albuterol (VENTOLIN HFA) 108 (90 Base) MCG/ACT inhaler Inhale 1 puff into the lungs every 6 (six) hours as needed for shortness of breath or wheezing.    [provider]  amLODipine (NORVASC) 5 MG tablet Take 5 mg by mouth daily.     [provider]  atorvastatin (LIPITOR) 40 MG tablet Take 40 mg by mouth daily. 02/28/19   [provider]  Budeson-Glycopyrrol-Formoterol (BREZTRI AEROSPHERE) 160-9-4.8 MCG/ACT AERO Inhale 1 puff into the lungs daily.    [provider]  diclofenac (VOLTAREN) 75 MG EC tablet Take 75 mg by mouth 2 (two) times daily.    [provider]  diclofenac Sodium (VOLTAREN) 1 % GEL Apply 1 application topically 3 (three) times daily as needed (pain.).    [provider]  dicyclomine (BENTYL) 10 MG capsule Take 10 mg by mouth every 6 (six) hours as needed for spasms. 03/09/20   [provider]  diphenhydrAMINE  (BENADRYL) 25 MG tablet Take 25 mg by mouth every 6 (six) hours as needed for allergies.    [provider]  famotidine (PEPCID) 40 MG tablet Take 40 mg by mouth daily.    [provider]  fluticasone (FLONASE) 50 MCG/ACT nasal spray Place 2 sprays into both nostrils daily as needed for allergies.  02/26/19   [provider]  glipiZIDE (GLUCOTROL XL) 2.5 MG 24 hr tablet Take 2.5 mg by mouth daily. 02/20/20   [provider]  ibuprofen (ADVIL) 800 MG tablet TAKE 1 TABLET BY MOUTH EVERY 8 HOURS AS NEEDED Patient not taking: No sig reported 08/22/19   Vickki Hearing, MD  meloxicam (MOBIC) 7.5 MG tablet Take 7.5 mg by mouth daily as needed for pain. 03/26/19   [provider]  metFORMIN (GLUCOPHAGE-XR) 500 MG 24 hr tablet Take 500 mg by mouth in the morning and at bedtime. 02/28/19   [provider]  naproxen (NAPROSYN) 500 MG tablet Take 500 mg by mouth 2 (two) times daily as needed for moderate pain.    [provider]  oxyCODONE (OXY IR/ROXICODONE) 5 MG immediate release tablet Take 1-2 tablets (5-10 mg total) by mouth every 4 (four) hours as needed for moderate pain (pain score 4-6). 04/02/20   Anson Oregon, PA-C  pantoprazole (PROTONIX) 40 MG tablet Take 40 mg by mouth 2 (two) times daily.    [provider]  promethazine (PHENERGAN) 12.5 MG tablet Take  1 tablet (12.5 mg total) by mouth every 6 (six) hours as needed for nausea or vomiting. Patient not taking: No sig reported 05/16/19   Vickki Hearing, MD  tiZANidine (ZANAFLEX) 4 MG tablet TAKE 1 TABLET BY MOUTH THREE TIMES A DAY 04/28/20   Vickki Hearing, MD  TRADJENTA 5 MG TABS tablet Take 5 mg by mouth daily. 12/12/19   [provider]      Allergies    Metronidazole and Penicillin g    Review of Systems   Review of Systems  Physical Exam Updated Vital Signs BP (!) 134/96 (BP Location: Right Arm)   Pulse 75   Temp (!) 97.5 F (36.4 C) (Oral)    Resp (!) 22   Ht 5\' 3"  (1.6 m)   Wt 105.7 kg   SpO2 98%   BMI 41.27 kg/m  Physical Exam Vitals and nursing note reviewed.  Constitutional:      General: She is not in acute distress.    Appearance: She is well-developed. She is obese. She is not ill-appearing or diaphoretic.  HENT:     Head: Normocephalic and atraumatic.     Right Ear: External ear normal.     Left Ear: External ear normal.  Eyes:     Conjunctiva/sclera: Conjunctivae normal.     Pupils: Pupils are equal, round, and reactive to light.  Neck:     Trachea: Phonation normal.  Cardiovascular:     Rate and Rhythm: Normal rate and regular rhythm.     Heart sounds: Normal heart sounds.  Pulmonary:     Effort: Pulmonary effort is normal. No respiratory distress.     Breath sounds: No stridor. Rhonchi present.     Comments: Good air movement bilaterally Abdominal:     Palpations: Abdomen is soft.     Tenderness: There is no abdominal tenderness.  Musculoskeletal:        General: Normal range of motion.     Cervical back: Normal range of motion and neck supple.  Skin:    General: Skin is warm and dry.  Neurological:     Mental Status: She is alert and oriented to person, place, and time.     Cranial Nerves: No cranial nerve deficit.     Sensory: No sensory deficit.     Motor: No abnormal muscle tone.     Coordination: Coordination normal.  Psychiatric:        Behavior: Behavior normal.        Thought Content: Thought content normal.        Judgment: Judgment normal.     ED Results / Procedures / Treatments   Labs (all labs ordered are listed, but only abnormal results are displayed) Labs Reviewed - No data to display  EKG None  Radiology No results found.  Procedures Procedures  {Document cardiac monitor, telemetry assessment procedure when appropriate:1}  Medications Ordered in ED Medications - No data to display  ED Course/ Medical Decision Making/ A&P                           Medical  Decision Making Patient presenting for URI symptoms, without improvement after treatment.  She has chronic asthma/bronchitis, using albuterol and Breztri.  She has tested negative for COVID at home.  Patient presents requesting only a chest x-ray.  Amount and/or Complexity of Data Reviewed Independent Historian:     Details: She is a cogent historian Radiology: ordered.   ***  {  Document critical care time when appropriate:1} {Document review of labs and clinical decision tools ie heart score, Chads2Vasc2 etc:1}  {Document your independent review of radiology images, and any outside records:1} {Document your discussion with family members, caretakers, and with consultants:1} {Document social determinants of health affecting pt's care:1} {Document your decision making why or why not admission, treatments were needed:1} Final Clinical Impression(s) / ED Diagnoses Final diagnoses:  None    Rx / DC Orders ED Discharge Orders     None

## 2022-03-03 ENCOUNTER — Encounter: Payer: Self-pay | Admitting: Radiology

## 2022-04-25 ENCOUNTER — Other Ambulatory Visit: Payer: Self-pay | Admitting: Family Medicine

## 2022-04-25 DIAGNOSIS — Z1231 Encounter for screening mammogram for malignant neoplasm of breast: Secondary | ICD-10-CM

## 2022-05-07 IMAGING — MG MM DIGITAL SCREENING BILAT W/ TOMO AND CAD
6 of 12 series · 6 of 36 positions shown · non-contrast
Comparison: Previous exam(s).

ACR Breast Density Category a: The breast tissue is almost entirely
fatty.

CLINICAL DATA: Screening.

EXAM:
DIGITAL SCREENING BILATERAL MAMMOGRAM WITH TOMOSYNTHESIS AND CAD
TECHNIQUE: Bilateral screening digital craniocaudal and mediolateral oblique
mammograms were obtained. Bilateral screening digital breast
tomosynthesis was performed. The images were evaluated with
computer-aided detection.

[L MLO synth-2D (1 of 2)]
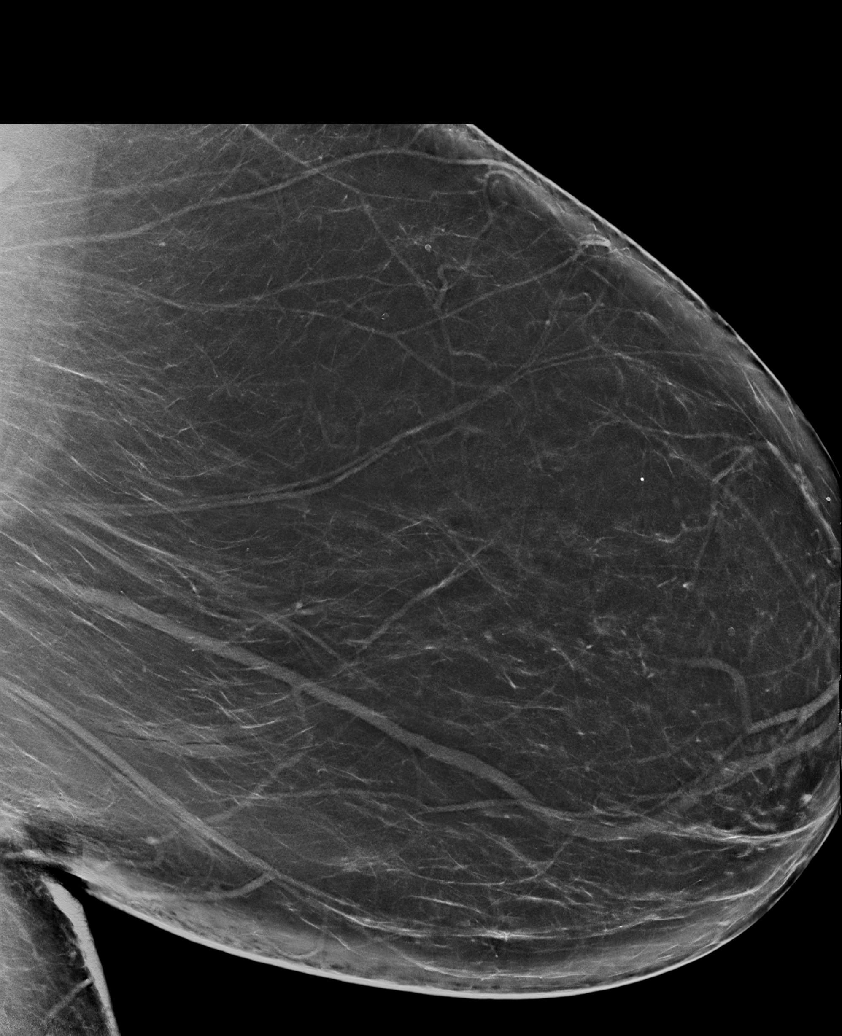

[R CV synth-2D]
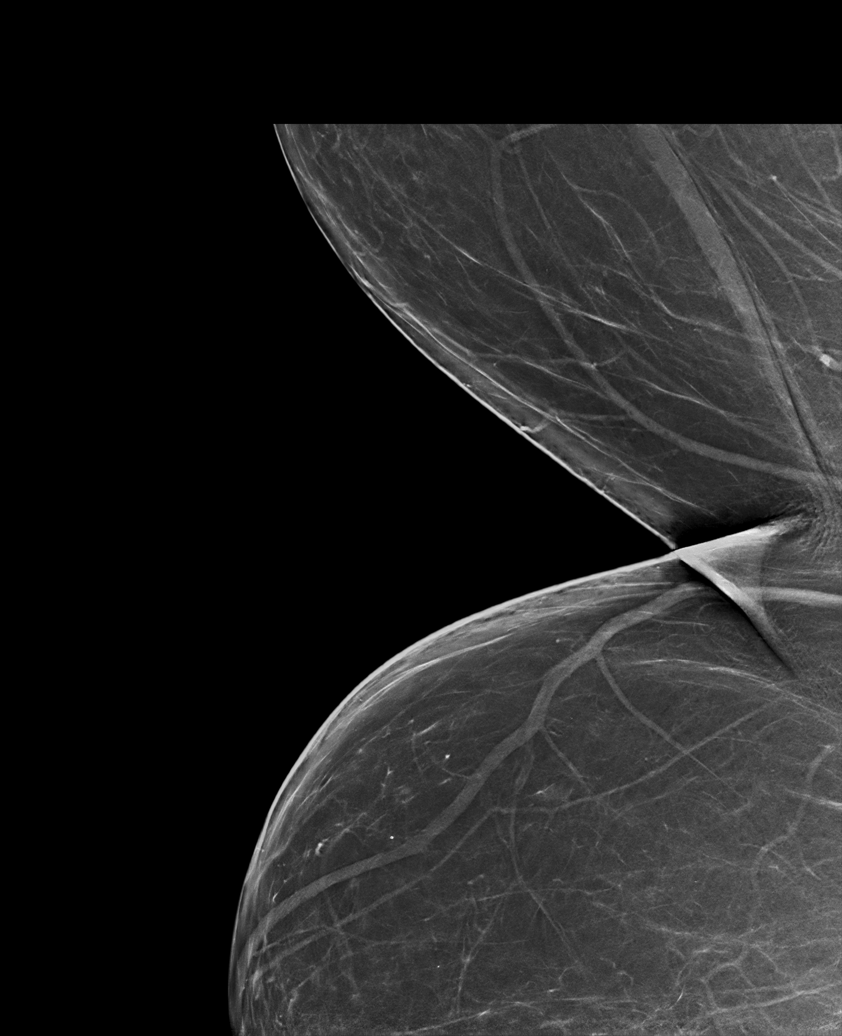

[L CC synth-2D]
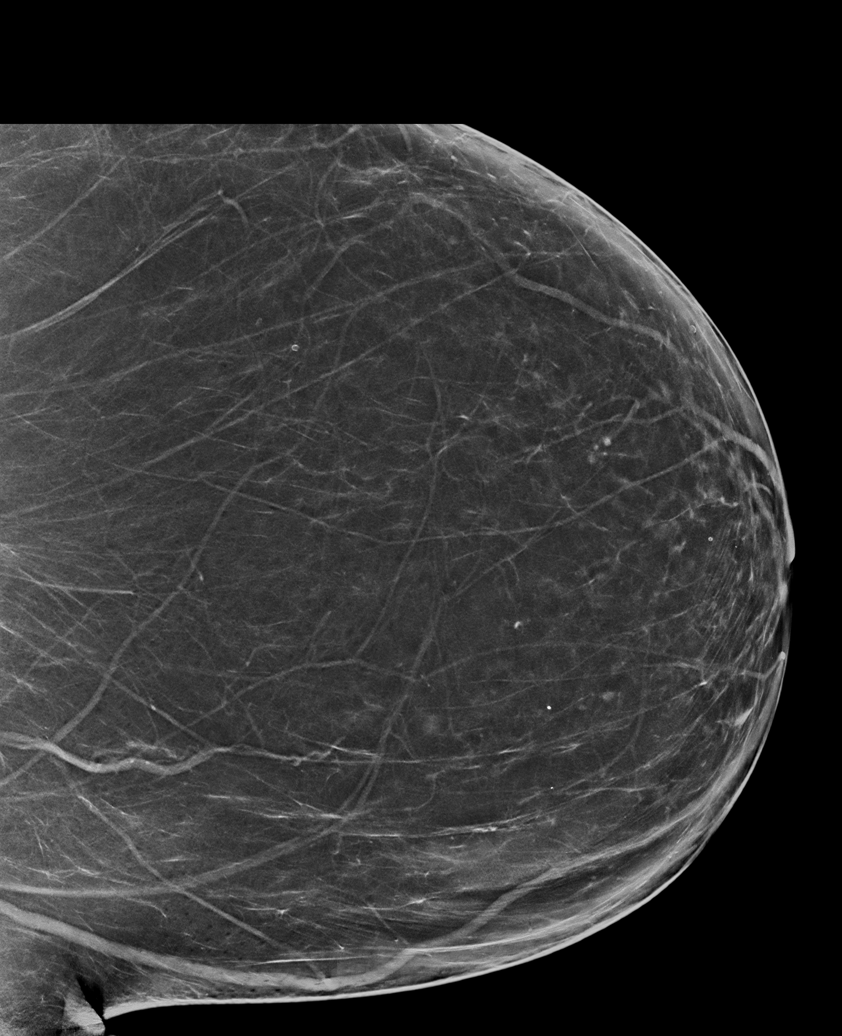

[R MLO synth-2D]
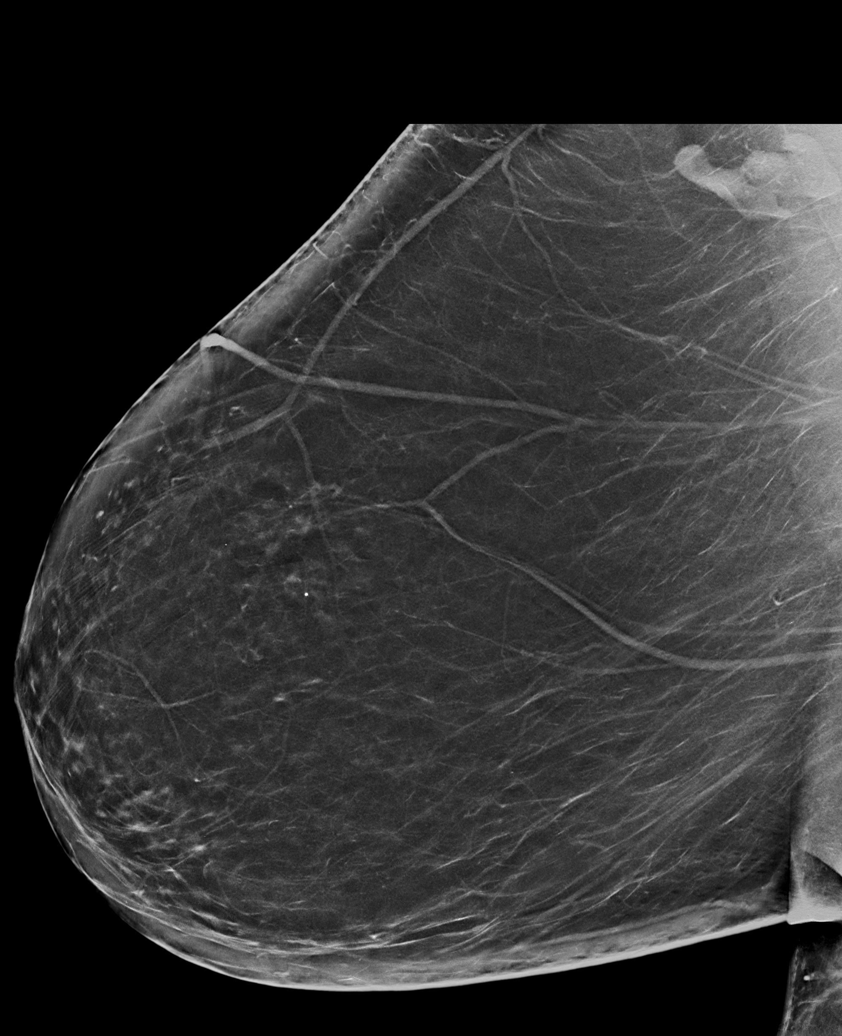

[L MLO synth-2D (2 of 2)]
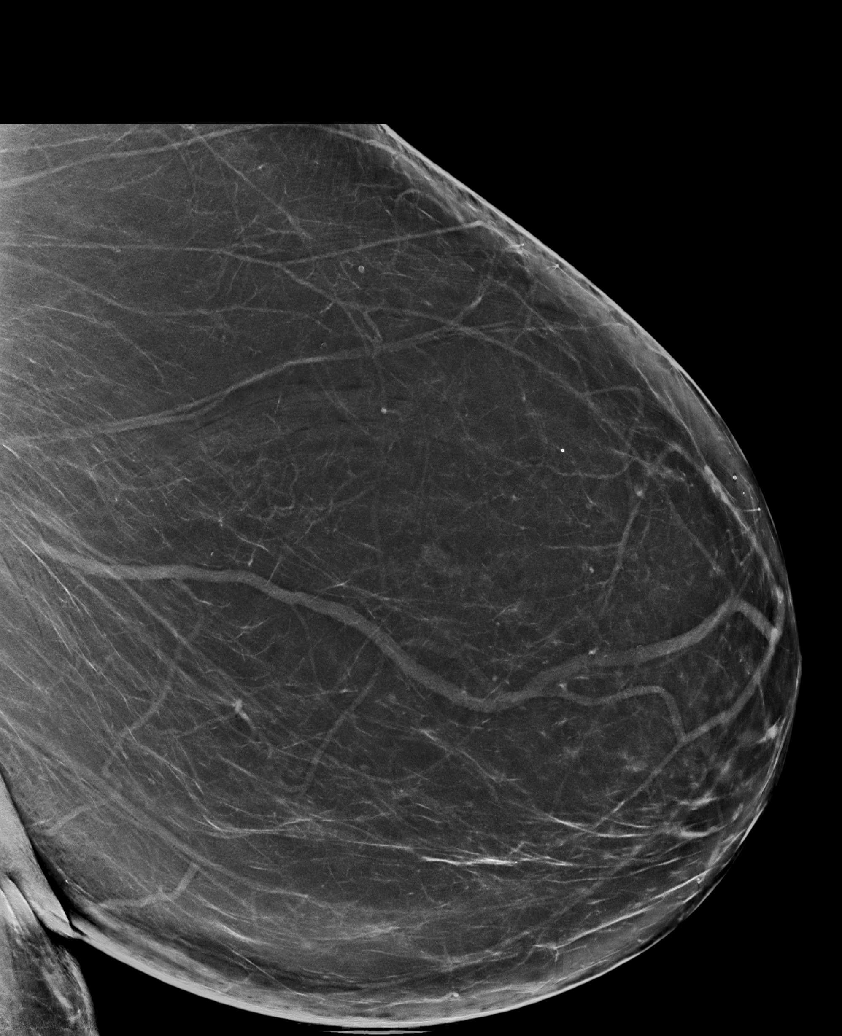

[R CC synth-2D]
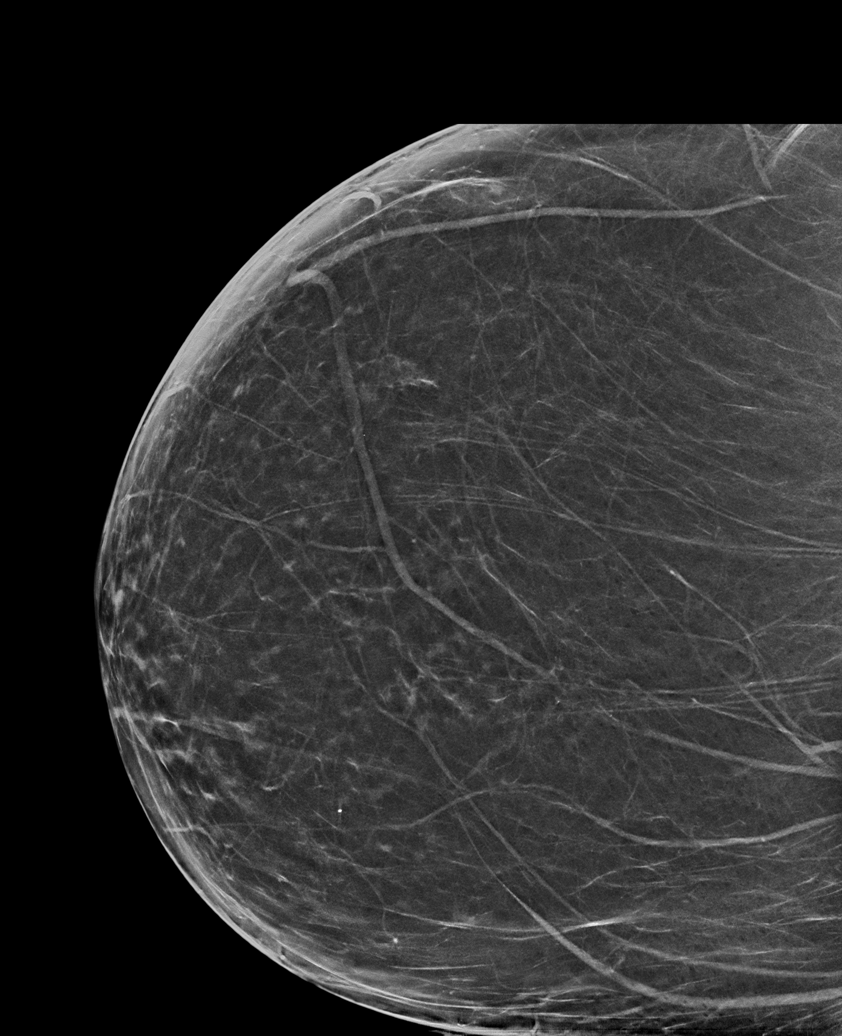

[6 of 36 positions shown; findings below may reference images not displayed]

FINDINGS: There are no findings suspicious for malignancy.
IMPRESSION: No mammographic evidence of malignancy. A result letter of this
screening mammogram will be mailed directly to the patient.

RECOMMENDATION:
Screening mammogram in one year. (Code:0E-[id number])

BI-RADS CATEGORY  1: Negative.

## 2022-06-23 ENCOUNTER — Ambulatory Visit
Admission: RE | Admit: 2022-06-23 | Discharge: 2022-06-23 | Disposition: A | Discharge status: Home / Self Care | Payer: Medicaid Other | Source: Ambulatory Visit | Attending: Family Medicine | Admitting: Family Medicine

## 2022-06-23 DIAGNOSIS — Z1231 Encounter for screening mammogram for malignant neoplasm of breast: Secondary | ICD-10-CM | POA: Insufficient documentation

## 2022-08-22 ENCOUNTER — Other Ambulatory Visit: Payer: Self-pay | Admitting: Student

## 2022-08-22 DIAGNOSIS — M7581 Other shoulder lesions, right shoulder: Secondary | ICD-10-CM

## 2022-08-22 DIAGNOSIS — M7521 Bicipital tendinitis, right shoulder: Secondary | ICD-10-CM

## 2022-08-22 DIAGNOSIS — M79601 Pain in right arm: Secondary | ICD-10-CM

## 2022-08-22 DIAGNOSIS — G8929 Other chronic pain: Secondary | ICD-10-CM

## 2022-09-20 ENCOUNTER — Encounter: Payer: Self-pay | Admitting: Student

## 2022-09-22 ENCOUNTER — Other Ambulatory Visit: Payer: Medicaid Other

## 2022-10-17 ENCOUNTER — Other Ambulatory Visit: Payer: Self-pay | Admitting: Student

## 2022-10-17 ENCOUNTER — Encounter: Payer: Self-pay | Admitting: Student

## 2022-10-17 DIAGNOSIS — M7521 Bicipital tendinitis, right shoulder: Secondary | ICD-10-CM

## 2022-10-17 DIAGNOSIS — G8929 Other chronic pain: Secondary | ICD-10-CM

## 2022-10-17 DIAGNOSIS — M7581 Other shoulder lesions, right shoulder: Secondary | ICD-10-CM

## 2022-10-18 ENCOUNTER — Encounter: Payer: Self-pay | Admitting: Student

## 2022-10-20 ENCOUNTER — Other Ambulatory Visit: Payer: Medicaid Other

## 2022-11-11 ENCOUNTER — Ambulatory Visit
Admission: RE | Admit: 2022-11-11 | Discharge: 2022-11-11 | Disposition: A | Discharge status: Home / Self Care | Payer: Medicare Other | Source: Ambulatory Visit | Attending: Student | Admitting: Student

## 2022-11-11 DIAGNOSIS — G8929 Other chronic pain: Secondary | ICD-10-CM

## 2022-11-11 DIAGNOSIS — M7581 Other shoulder lesions, right shoulder: Secondary | ICD-10-CM

## 2022-11-11 DIAGNOSIS — M7521 Bicipital tendinitis, right shoulder: Secondary | ICD-10-CM

## 2023-01-24 ENCOUNTER — Other Ambulatory Visit: Payer: Self-pay | Admitting: Surgery

## 2023-01-25 ENCOUNTER — Encounter
Admission: RE | Admit: 2023-01-25 | Discharge: 2023-01-25 | Disposition: A | Discharge status: Home / Self Care | Payer: No Typology Code available for payment source | Source: Ambulatory Visit | Attending: Surgery | Admitting: Surgery

## 2023-01-25 ENCOUNTER — Other Ambulatory Visit: Payer: Self-pay

## 2023-01-25 VITALS — BP 119/81 | HR 47 | Temp 97.6°F | Resp 16 | Ht 62.0 in | Wt 207.0 lb

## 2023-01-25 DIAGNOSIS — Z01818 Encounter for other preprocedural examination: Secondary | ICD-10-CM | POA: Insufficient documentation

## 2023-01-25 DIAGNOSIS — I491 Atrial premature depolarization: Secondary | ICD-10-CM | POA: Insufficient documentation

## 2023-01-25 DIAGNOSIS — Z01810 Encounter for preprocedural cardiovascular examination: Secondary | ICD-10-CM | POA: Diagnosis not present

## 2023-01-25 DIAGNOSIS — Z01812 Encounter for preprocedural laboratory examination: Secondary | ICD-10-CM

## 2023-01-25 HISTORY — DX: Essential (primary) hypertension: I10

## 2023-01-25 HISTORY — DX: Epilepsy, unspecified, not intractable, without status epilepticus: G40.909

## 2023-01-25 HISTORY — DX: Pneumonia, unspecified organism: J18.9

## 2023-01-25 HISTORY — DX: Type 2 diabetes mellitus without complications: E11.9

## 2023-01-25 HISTORY — DX: Hyperlipidemia, unspecified: E78.5

## 2023-01-25 LAB — COMPREHENSIVE METABOLIC PANEL
ALT: 13 U/L (ref 0–44)
AST: 14 U/L — ABNORMAL LOW (ref 15–41)
Albumin: 3.9 g/dL (ref 3.5–5.0)
Alkaline Phosphatase: 89 U/L (ref 38–126)
Anion gap: 8 (ref 5–15)
BUN: 13 mg/dL (ref 8–23)
CO2: 26 mmol/L (ref 22–32)
Calcium: 9.2 mg/dL (ref 8.9–10.3)
Chloride: 107 mmol/L (ref 98–111)
Creatinine, Ser: 0.68 mg/dL (ref 0.44–1.00)
GFR, Estimated: >60 mL/min (ref >60)
Glucose, Bld: 79 mg/dL (ref 70–99)
Potassium: 4.4 mmol/L (ref 3.5–5.1)
Sodium: 141 mmol/L (ref 135–145)
Total Bilirubin: 0.5 mg/dL (ref 0.0–1.2)
Total Protein: 7.3 g/dL (ref 6.5–8.1)

## 2023-01-25 LAB — CBC WITH DIFFERENTIAL/PLATELET
Abs Immature Granulocytes: 0.07 10*3/uL (ref 0.00–0.07)
Basophils Absolute: 0.1 10*3/uL (ref 0.0–0.1)
Basophils Relative: 1 %
Eosinophils Absolute: 0.4 10*3/uL (ref 0.0–0.5)
Eosinophils Relative: 4 %
HCT: 44 % (ref 36.0–46.0)
Hemoglobin: 13.6 g/dL (ref 12.0–15.0)
Immature Granulocytes: 1 %
Lymphocytes Relative: 31 %
Lymphs Abs: 2.5 10*3/uL (ref 0.7–4.0)
MCH: 23.9 pg — ABNORMAL LOW (ref 26.0–34.0)
MCHC: 30.9 g/dL (ref 30.0–36.0)
MCV: 77.3 fL — ABNORMAL LOW (ref 80.0–100.0)
Monocytes Absolute: 0.6 10*3/uL (ref 0.1–1.0)
Monocytes Relative: 7 %
Neutro Abs: 4.5 10*3/uL (ref 1.7–7.7)
Neutrophils Relative %: 56 %
Platelets: 282 10*3/uL (ref 150–400)
RBC: 5.69 MIL/uL — ABNORMAL HIGH (ref 3.87–5.11)
RDW: 15.9 % — ABNORMAL HIGH (ref 11.5–15.5)
WBC: 8.1 10*3/uL (ref 4.0–10.5)
nRBC: 0 % (ref 0.0–0.2)

## 2023-01-25 LAB — URINALYSIS, ROUTINE W REFLEX MICROSCOPIC
Bilirubin Urine: NEGATIVE
Glucose, UA: NEGATIVE mg/dL
Hgb urine dipstick: NEGATIVE
Ketones, ur: NEGATIVE mg/dL
Leukocytes,Ua: NEGATIVE
Nitrite: NEGATIVE
Protein, ur: NEGATIVE mg/dL
Specific Gravity, Urine: 1.028 (ref 1.005–1.030)
pH: 5 (ref 5.0–8.0)

## 2023-01-25 LAB — SURGICAL PCR SCREEN
MRSA, PCR: NEGATIVE
Staphylococcus aureus: NEGATIVE

## 2023-01-25 NOTE — Patient Instructions (Addendum)
Your procedure is scheduled on: Thursday, January 30 Report to the Registration Desk on the 1st floor of the [facility] To find out your arrival time, please call [phone] between 1PM - 3PM on: Wednesday, January 29 If your arrival time is 6:00 am, do not arrive before that time as the [facility] entrance doors do not open until 6:00 am.  REMEMBER: Instructions that are not followed completely may result in serious medical risk, up to and including death; or upon the discretion of your surgeon and anesthesiologist your surgery may need to be rescheduled.  Do not eat food after midnight the night before surgery.  No gum chewing or hard candies.  You may however, drink water up to 2 hours before you are scheduled to arrive for your surgery. Do not drink anything within 2 hours of your scheduled arrival time.  In addition, your doctor has ordered for you to drink the provided:  Gatorade G2 Drinking this carbohydrate drink up to two hours before surgery helps to reduce insulin resistance and improve patient outcomes. Please complete drinking 2 hours before scheduled arrival time.  One week prior to surgery: starting January 23 Stop Anti-inflammatories (NSAIDS) such as Advil, Aleve, Ibuprofen, Motrin, Naproxen, Naprosyn and Aspirin based products such as Excedrin, Goody's Powder, BC Powder. Stop ANY OVER THE COUNTER supplements until after surgery.  You may however, continue to take Tylenol if needed for pain up until the day of surgery.  Ozempic - hold for 7 days before surgery. Do NOT take Ozempic on Sunday, January 26. Resume AFTER surgery on your regular weekly day, Sunday, February 2.  Continue taking all of your other prescription medications up until the day of surgery.  ON THE DAY OF SURGERY ONLY TAKE THESE MEDICATIONS WITH SIPS OF WATER:  Amlodipine Atorvastatin Albuterol inhaler and bring to the hospital.  No Alcohol for 24 hours before or after surgery.  No Smoking  including e-cigarettes for 24 hours before surgery.  No chewable tobacco products for at least 6 hours before surgery.  No nicotine patches on the day of surgery.  Do not use any "recreational" drugs for at least a week (preferably 2 weeks) before your surgery.  Please be advised that the combination of cocaine and anesthesia may have negative outcomes, up to and including death. If you test positive for cocaine, your surgery will be cancelled.  On the morning of surgery brush your teeth with toothpaste and water, you may rinse your mouth with mouthwash if you wish. Do not swallow any toothpaste or mouthwash.  Use CHG Soap as directed on instruction sheet.  Do not wear jewelry, make-up, hairpins, clips or nail polish.  For welded (permanent) jewelry: bracelets, anklets, waist bands, etc.  Please have this removed prior to surgery.  If it is not removed, there is a chance that hospital personnel will need to cut it off on the day of surgery.  Do not wear lotions, powders, or perfumes.   Do not shave body hair from the neck down 48 hours before surgery.  Contact lenses, hearing aids and dentures may not be worn into surgery.  Do not bring valuables to the hospital. [facility] is not responsible for any missing/lost belongings or valuables.   Total Shoulder Arthroplasty:  use Benzoyl Peroxide 5% Gel as directed on instruction sheet.  Notify your doctor if there is any change in your medical condition (cold, fever, infection).  Wear comfortable clothing (specific to your surgery type) to the hospital.  After surgery, you can help prevent lung complications by doing breathing exercises.  Take deep breaths and cough every 1-2 hours. Your doctor may order a device called an Incentive Spirometer to help you take deep breaths.  If you are being admitted to the hospital overnight, leave your suitcase in the car. After surgery it may be brought to your room.  In case of increased patient  census, it may be necessary for you, the patient, to continue your postoperative care in the Same Day Surgery department.  If you are being discharged the day of surgery, you will not be allowed to drive home. You will need a responsible individual to drive you home and stay with you for 24 hours after surgery.   If you are taking public transportation, you will need to have a responsible individual with you.  Please call the Pre-admissions Testing Dept. at [phone] if you have any questions about these instructions.  Surgery Visitation Policy:  Patients having surgery or a procedure may have two visitors.  Children under the age of 65 must have an adult with them who is not the patient.  Temporary Visitor Restrictions Due to increasing cases of flu, RSV and COVID-19: Children ages 46 and under will not be able to visit patients in [facility] hospitals under most circumstances.  Inpatient Visitation:    Visiting hours are 7 a.m. to 8 p.m. Up to four visitors are allowed at one time in a patient room. The visitors may rotate out with other people during the day.  One visitor age 31 or older may stay with the patient overnight and must be in the room by 8 p.m.     Pre-operative 5 CHG Bath Instructions   You can play a key role in reducing the risk of infection after surgery. Your skin needs to be as free of germs as possible. You can reduce the number of germs on your skin by washing with CHG (chlorhexidine gluconate) soap before surgery. CHG is an antiseptic soap that kills germs and continues to kill germs even after washing.   DO NOT use if you have an allergy to chlorhexidine/CHG or antibacterial soaps. If your skin becomes reddened or irritated, stop using the CHG and notify one of our RNs at [phone].   Please shower with the CHG soap starting 4 days before surgery using the following schedule:     Please keep in mind the following:  DO NOT shave, including legs  and underarms, starting the day of your first shower.   You may shave your face at any point before/day of surgery.  Place clean sheets on your bed the day you start using CHG soap. Use a clean washcloth (not used since being washed) for each shower. DO NOT sleep with pets once you start using the CHG.   CHG Shower Instructions:  If you choose to wash your hair and private area, wash first with your normal shampoo/soap.  After you use shampoo/soap, rinse your hair and body thoroughly to remove shampoo/soap residue.  Turn the water OFF and apply about 3 tablespoons (45 ml) of CHG soap to a CLEAN washcloth.  Apply CHG soap ONLY FROM YOUR NECK DOWN TO YOUR TOES (washing for 3-5 minutes)  DO NOT use CHG soap on face, private areas, open wounds, or sores.  Pay special attention to the area where your surgery is being performed.  If you are having back surgery, having someone wash your back for you may be  helpful. Wait 2 minutes after CHG soap is applied, then you may rinse off the CHG soap.  Pat dry with a clean towel  Put on clean clothes/pajamas   If you choose to wear lotion, please use ONLY the CHG-compatible lotions on the back of this paper.     Additional instructions for the day of surgery: DO NOT APPLY any lotions, deodorants, cologne, or perfumes.   Put on clean/comfortable clothes.  Brush your teeth.  Ask your nurse before applying any prescription medications to the skin.      CHG Compatible Lotions   Aveeno Moisturizing lotion  Cetaphil Moisturizing Cream  Cetaphil Moisturizing Lotion  Clairol Herbal Essence Moisturizing Lotion, Dry Skin  Clairol Herbal Essence Moisturizing Lotion, Extra Dry Skin  Clairol Herbal Essence Moisturizing Lotion, Normal Skin  Curel Age Defying Therapeutic Moisturizing Lotion with Alpha Hydroxy  Curel Extreme Care Body Lotion  Curel Soothing Hands Moisturizing Hand Lotion  Curel Therapeutic Moisturizing Cream, Fragrance-Free  Curel  Therapeutic Moisturizing Lotion, Fragrance-Free  Curel Therapeutic Moisturizing Lotion, Original Formula  Eucerin Daily Replenishing Lotion  Eucerin Dry Skin Therapy Plus Alpha Hydroxy Crme  Eucerin Dry Skin Therapy Plus Alpha Hydroxy Lotion  Eucerin Original Crme  Eucerin Original Lotion  Eucerin Plus Crme Eucerin Plus Lotion  Eucerin TriLipid Replenishing Lotion  Keri Anti-Bacterial Hand Lotion  Keri Deep Conditioning Original Lotion Dry Skin Formula Softly Scented  Keri Deep Conditioning Original Lotion, Fragrance Free Sensitive Skin Formula  Keri Lotion Fast Absorbing Fragrance Free Sensitive Skin Formula  Keri Lotion Fast Absorbing Softly Scented Dry Skin Formula  Keri Original Lotion  Keri Skin Renewal Lotion Keri Silky Smooth Lotion  Keri Silky Smooth Sensitive Skin Lotion  Nivea Body Creamy Conditioning Oil  Nivea Body Extra Enriched Lotion  Nivea Body Original Lotion  Nivea Body Sheer Moisturizing Lotion Nivea Crme  Nivea Skin Firming Lotion  NutraDerm 30 Skin Lotion  NutraDerm Skin Lotion  NutraDerm Therapeutic Skin Cream  NutraDerm Therapeutic Skin Lotion  ProShield Protective Hand Cream  Provon moisturizing lotion   Preparing for Total Shoulder Arthroplasty  Before surgery, you can play an important role by reducing the number of germs on your skin by using the following products:  Benzoyl Peroxide Gel  o Reduces the number of germs present on the skin  o Applied twice a day to shoulder area starting two days before surgery  Chlorhexidine Gluconate (CHG) Soap  o An antiseptic cleaner that kills germs and bonds with the skin to continue killing germs even after washing  o Used for showering the night before surgery and morning of surgery  BENZOYL PEROXIDE 5% GEL  Please do not use if you have an allergy to benzoyl peroxide. If your skin becomes reddened/irritated stop using the benzoyl peroxide.  Starting two days before surgery, apply as  follows:  1. Apply benzoyl peroxide in the morning and at night. Apply after taking a shower. If you are not taking a shower, clean entire shoulder front, back, and side along with the armpit with a clean wet washcloth.  2. Place a quarter-sized dollop on your shoulder and rub in thoroughly, making sure to cover the front, back, and side of your shoulder, along with the armpit.  2 days before ____ AM ____ PM 1 day before ____ AM ____ PM  3. Do this twice a day for two days. (Last application is the night before surgery, AFTER using the CHG soap).  4. Do NOT apply benzoyl peroxide gel on the day of  surgery.    Preoperative Educational Videos for Total Hip, Knee and Shoulder Replacements  To better prepare for surgery, please view our videos that explain the physical activity and discharge planning required to have the best surgical recovery at [facility].  [email or web address]  Questions? Call [phone] or email [email or web address]

## 2023-01-26 NOTE — Progress Notes (Signed)
  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 In review of her medication reconciliation it was noted that patient is on a prescribed GLP-1 medication. Per guidelines issued by the American Society of Anesthesiologists (ASA), it is recommended that these medications be held for 7 days prior to the patient undergoing any type of elective surgical procedure. The patient is taking the following GLP-1 medication:  [x]  SEMAGLUTIDE   []  EXENATIDE  []  LIRAGLUTIDE   []  LIXISENATIDE  []  DULAGLUTIDE     []  TIRZEPATIDE (GLP-1/GIP)  Reached out to prescribing provider Huntley Dec, PA-C ) to make them aware of the guidelines from anesthesia. Given that this patient takes the prescribed GLP-1 medication for her  diabetes diagnosis, rather than for weight loss, recommendations from the prescribing provider were solicited. Prescribing provider made aware of the following so that informed decision/POC can be developed for this patient that may be taking medications belonging to these drug classes:  Oral GLP-1 medications will be held 1 day prior to surgery.  Injectable GLP-1 medications will be held 7 days prior to surgery.  Metformin is routinely held 48 hours prior to surgery due to renal concerns, potential  need for contrasted imaging perioperatively, and the potential for tissue hypoxia leading to drug induced lactic acidosis.  All SGLT2i medications are held 72 hours prior to surgery as they can be associated with the increased potential for developing euglycemic diabetic ketoacidosis (EDKA).   Impression and Plan:  Alyssa Patrick is on a prescribed GLP-1 medication, which induces the known side effect of decreased gastric emptying. Efforts are bring made to mitigate the risk of perioperative hyperglycemic events, as elevated blood glucose levels have been found to contribute to intra/postoperative complications. Additionally, hyperglycemic extremes can potentially necessitate the postponing of a patient's elective case in order to better optimize perioperative glycemic control, again with the aforementioned guidelines in place. With this in mind, recommendations have been sought from the prescribing provider, who has cleared patient to proceed with holding the prescribed GLP-1 as per the guidelines from the ASA.   Provider recommending: no further recommendations received from the prescribing provider.  Copy of signed clearance and recommendations placed on patient's chart for inclusion in their medical record and for review by the surgical/anesthetic team on the day of her procedure.   Quentin Mulling, MSN, APRN, FNP-C, CEN [facility]  Perioperative Services Nurse Practitioner Phone: [phone] Fax: [phone] 01/26/23 10:20 AM  NOTE: This note has been prepared using Dragon dictation software. Despite my best ability to proofread, there is always the potential that unintentional transcriptional errors may still occur from this process.

## 2023-02-01 MED ORDER — CHLORHEXIDINE GLUCONATE 0.12 % MT SOLN
15.0000 mL | Freq: Once | OROMUCOSAL | Status: AC
  Administered 2023-02-02: 15 mL via OROMUCOSAL

## 2023-02-01 MED ORDER — SODIUM CHLORIDE 0.9 % IV SOLN: INTRAVENOUS | Status: DC

## 2023-02-01 MED ORDER — CEFAZOLIN SODIUM-DEXTROSE 2-4 GM/100ML-% IV SOLN
2.0000 g | INTRAVENOUS | Status: AC
  Administered 2023-02-02: 2 g via INTRAVENOUS

## 2023-02-01 MED ORDER — TRANEXAMIC ACID-NACL 1000-0.7 MG/100ML-% IV SOLN
1000.0000 mg | INTRAVENOUS | Status: AC
  Administered 2023-02-02: 1000 mg via INTRAVENOUS

## 2023-02-01 MED ORDER — ORAL CARE MOUTH RINSE: 15.0000 mL | Freq: Once | OROMUCOSAL | Status: AC

## 2023-02-02 ENCOUNTER — Ambulatory Visit
Admission: RE | Admit: 2023-02-02 | Discharge: 2023-02-03 | Disposition: A | Discharge status: Home / Self Care | Payer: No Typology Code available for payment source | Attending: Surgery | Admitting: Surgery

## 2023-02-02 ENCOUNTER — Ambulatory Visit: Payer: No Typology Code available for payment source | Admitting: Urgent Care

## 2023-02-02 ENCOUNTER — Other Ambulatory Visit: Payer: Self-pay

## 2023-02-02 ENCOUNTER — Ambulatory Visit: Payer: No Typology Code available for payment source

## 2023-02-02 ENCOUNTER — Encounter
Admission: RE | Disposition: A | Discharge status: Home / Self Care | Payer: Self-pay | Source: Home / Self Care | Attending: Surgery

## 2023-02-02 ENCOUNTER — Encounter: Payer: Self-pay | Admitting: Surgery

## 2023-02-02 DIAGNOSIS — M7521 Bicipital tendinitis, right shoulder: Secondary | ICD-10-CM | POA: Insufficient documentation

## 2023-02-02 DIAGNOSIS — G8929 Other chronic pain: Secondary | ICD-10-CM | POA: Insufficient documentation

## 2023-02-02 DIAGNOSIS — E119 Type 2 diabetes mellitus without complications: Secondary | ICD-10-CM | POA: Diagnosis not present

## 2023-02-02 DIAGNOSIS — G4730 Sleep apnea, unspecified: Secondary | ICD-10-CM | POA: Diagnosis not present

## 2023-02-02 DIAGNOSIS — E66813 Obesity, class 3: Secondary | ICD-10-CM | POA: Insufficient documentation

## 2023-02-02 DIAGNOSIS — Z7985 Long-term (current) use of injectable non-insulin antidiabetic drugs: Secondary | ICD-10-CM | POA: Diagnosis not present

## 2023-02-02 DIAGNOSIS — Z6837 Body mass index (BMI) 37.0-37.9, adult: Secondary | ICD-10-CM | POA: Diagnosis not present

## 2023-02-02 DIAGNOSIS — M75111 Incomplete rotator cuff tear or rupture of right shoulder, not specified as traumatic: Secondary | ICD-10-CM | POA: Insufficient documentation

## 2023-02-02 DIAGNOSIS — M25811 Other specified joint disorders, right shoulder: Secondary | ICD-10-CM | POA: Insufficient documentation

## 2023-02-02 DIAGNOSIS — Z794 Long term (current) use of insulin: Secondary | ICD-10-CM | POA: Insufficient documentation

## 2023-02-02 DIAGNOSIS — I10 Essential (primary) hypertension: Secondary | ICD-10-CM | POA: Diagnosis not present

## 2023-02-02 DIAGNOSIS — M7581 Other shoulder lesions, right shoulder: Secondary | ICD-10-CM | POA: Diagnosis not present

## 2023-02-02 DIAGNOSIS — K219 Gastro-esophageal reflux disease without esophagitis: Secondary | ICD-10-CM | POA: Diagnosis not present

## 2023-02-02 DIAGNOSIS — Z96611 Presence of right artificial shoulder joint: Secondary | ICD-10-CM

## 2023-02-02 DIAGNOSIS — Z01812 Encounter for preprocedural laboratory examination: Secondary | ICD-10-CM

## 2023-02-02 HISTORY — PX: REVERSE SHOULDER ARTHROPLASTY: SHX5054

## 2023-02-02 LAB — GLUCOSE, CAPILLARY
Glucose-Capillary: 89 mg/dL (ref 70–99)
Glucose-Capillary: 150 mg/dL — ABNORMAL HIGH (ref 70–99)
Glucose-Capillary: 181 mg/dL — ABNORMAL HIGH (ref 70–99)
Glucose-Capillary: 126 mg/dL — ABNORMAL HIGH (ref 70–99)

## 2023-02-02 SURGERY — ARTHROPLASTY, SHOULDER, TOTAL, REVERSE
Anesthesia: General | Site: Shoulder | Laterality: Right

## 2023-02-02 MED ORDER — BUPIVACAINE-EPINEPHRINE (PF) 0.5% -1:200000 IJ SOLN
INTRAMUSCULAR | Status: AC
  Filled 2023-02-02: qty 30

## 2023-02-02 MED ORDER — LIDOCAINE HCL (CARDIAC) PF 100 MG/5ML IV SOSY
PREFILLED_SYRINGE | INTRAVENOUS | Status: DC | PRN
  Administered 2023-02-02: 100 mg via INTRAVENOUS

## 2023-02-02 MED ORDER — OXYCODONE HCL 5 MG PO TABS
ORAL_TABLET | ORAL | Status: AC
  Filled 2023-02-02: qty 2

## 2023-02-02 MED ORDER — MIDAZOLAM HCL 2 MG/2ML IJ SOLN
INTRAMUSCULAR | Status: AC
  Filled 2023-02-02: qty 2

## 2023-02-02 MED ORDER — KETOROLAC TROMETHAMINE 15 MG/ML IJ SOLN
INTRAMUSCULAR | Status: AC
  Filled 2023-02-02: qty 1

## 2023-02-02 MED ORDER — DEXAMETHASONE SODIUM PHOSPHATE 10 MG/ML IJ SOLN
INTRAMUSCULAR | Status: DC | PRN
  Administered 2023-02-02: 10 mg via INTRAVENOUS

## 2023-02-02 MED ORDER — SEMAGLUTIDE (1 MG/DOSE) 4 MG/3ML ~~LOC~~ SOPN: 1.0000 mg | PEN_INJECTOR | SUBCUTANEOUS | Status: DC

## 2023-02-02 MED ORDER — BISACODYL 10 MG RE SUPP: 10.0000 mg | Freq: Every day | RECTAL | Status: DC | PRN

## 2023-02-02 MED ORDER — DEXMEDETOMIDINE HCL IN NACL 80 MCG/20ML IV SOLN
INTRAVENOUS | Status: AC
  Filled 2023-02-02: qty 20

## 2023-02-02 MED ORDER — ATORVASTATIN CALCIUM 10 MG PO TABS
ORAL_TABLET | ORAL | Status: AC
  Filled 2023-02-02: qty 4

## 2023-02-02 MED ORDER — CEFAZOLIN SODIUM-DEXTROSE 2-4 GM/100ML-% IV SOLN
INTRAVENOUS | Status: AC
  Filled 2023-02-02: qty 100

## 2023-02-02 MED ORDER — ACETAMINOPHEN 500 MG PO TABS
ORAL_TABLET | ORAL | Status: AC
  Filled 2023-02-02: qty 2

## 2023-02-02 MED ORDER — DIPHENHYDRAMINE HCL 25 MG PO CAPS
ORAL_CAPSULE | ORAL | Status: AC
  Filled 2023-02-02: qty 1

## 2023-02-02 MED ORDER — SUGAMMADEX SODIUM 200 MG/2ML IV SOLN
INTRAVENOUS | Status: DC | PRN
  Administered 2023-02-02: 190 mg via INTRAVENOUS

## 2023-02-02 MED ORDER — ROCURONIUM BROMIDE 100 MG/10ML IV SOLN
INTRAVENOUS | Status: DC | PRN
  Administered 2023-02-02: 60 mg via INTRAVENOUS

## 2023-02-02 MED ORDER — ACETAMINOPHEN 10 MG/ML IV SOLN
INTRAVENOUS | Status: DC | PRN
  Administered 2023-02-02: 1000 mg via INTRAVENOUS

## 2023-02-02 MED ORDER — DROPERIDOL 2.5 MG/ML IJ SOLN: 0.6250 mg | Freq: Once | INTRAMUSCULAR | Status: DC | PRN

## 2023-02-02 MED ORDER — ONDANSETRON HCL 4 MG/2ML IJ SOLN
INTRAMUSCULAR | Status: AC
  Filled 2023-02-02: qty 2

## 2023-02-02 MED ORDER — DOCUSATE SODIUM 100 MG PO CAPS
100.0000 mg | ORAL_CAPSULE | Freq: Two times a day (BID) | ORAL | Status: DC
  Administered 2023-02-02 (×2): 100 mg via ORAL

## 2023-02-02 MED ORDER — FENTANYL CITRATE (PF) 100 MCG/2ML IJ SOLN
INTRAMUSCULAR | Status: AC
  Filled 2023-02-02: qty 2

## 2023-02-02 MED ORDER — ACETAMINOPHEN 500 MG PO TABS
1000.0000 mg | ORAL_TABLET | Freq: Four times a day (QID) | ORAL | Status: DC
  Administered 2023-02-02 – 2023-02-03 (×3): 1000 mg via ORAL

## 2023-02-02 MED ORDER — METOCLOPRAMIDE HCL 10 MG PO TABS: 5.0000 mg | ORAL_TABLET | Freq: Three times a day (TID) | ORAL | Status: DC | PRN

## 2023-02-02 MED ORDER — ONDANSETRON HCL 4 MG/2ML IJ SOLN
INTRAMUSCULAR | Status: DC | PRN
  Administered 2023-02-02: 4 mg via INTRAVENOUS

## 2023-02-02 MED ORDER — PHENYLEPHRINE HCL-NACL 20-0.9 MG/250ML-% IV SOLN
INTRAVENOUS | Status: DC | PRN
  Administered 2023-02-02: 33.333 ug/min via INTRAVENOUS

## 2023-02-02 MED ORDER — ENOXAPARIN SODIUM 40 MG/0.4ML IJ SOSY
40.0000 mg | PREFILLED_SYRINGE | INTRAMUSCULAR | Status: DC
  Administered 2023-02-03: 40 mg via SUBCUTANEOUS

## 2023-02-02 MED ORDER — SODIUM CHLORIDE 0.9 % IV SOLN: INTRAVENOUS | Status: DC

## 2023-02-02 MED ORDER — CEFAZOLIN SODIUM-DEXTROSE 2-4 GM/100ML-% IV SOLN
2.0000 g | Freq: Four times a day (QID) | INTRAVENOUS | Status: AC
  Administered 2023-02-02 (×2): 2 g via INTRAVENOUS

## 2023-02-02 MED ORDER — ACETAMINOPHEN 10 MG/ML IV SOLN
INTRAVENOUS | Status: AC
  Filled 2023-02-02: qty 100

## 2023-02-02 MED ORDER — PROPOFOL 10 MG/ML IV BOLUS
INTRAVENOUS | Status: DC | PRN
  Administered 2023-02-02: 100 mg via INTRAVENOUS

## 2023-02-02 MED ORDER — CHLORHEXIDINE GLUCONATE 0.12 % MT SOLN
OROMUCOSAL | Status: AC
  Filled 2023-02-02: qty 15

## 2023-02-02 MED ORDER — PHENYLEPHRINE 80 MCG/ML (10ML) SYRINGE FOR IV PUSH (FOR BLOOD PRESSURE SUPPORT)
PREFILLED_SYRINGE | INTRAVENOUS | Status: AC
  Filled 2023-02-02: qty 10

## 2023-02-02 MED ORDER — ALBUTEROL SULFATE HFA 108 (90 BASE) MCG/ACT IN AERS
INHALATION_SPRAY | RESPIRATORY_TRACT | Status: DC | PRN
  Administered 2023-02-02: 2 via RESPIRATORY_TRACT

## 2023-02-02 MED ORDER — INSULIN ASPART 100 UNIT/ML IJ SOLN
INTRAMUSCULAR | Status: AC
  Filled 2023-02-02: qty 1

## 2023-02-02 MED ORDER — PHENYLEPHRINE 80 MCG/ML (10ML) SYRINGE FOR IV PUSH (FOR BLOOD PRESSURE SUPPORT)
PREFILLED_SYRINGE | INTRAVENOUS | Status: DC | PRN
  Administered 2023-02-02 (×4): 80 ug via INTRAVENOUS

## 2023-02-02 MED ORDER — MAGNESIUM HYDROXIDE 400 MG/5ML PO SUSP: 30.0000 mL | Freq: Every day | ORAL | Status: DC | PRN

## 2023-02-02 MED ORDER — DOCUSATE SODIUM 100 MG PO CAPS
ORAL_CAPSULE | ORAL | Status: AC
  Filled 2023-02-02: qty 1

## 2023-02-02 MED ORDER — INSULIN ASPART 100 UNIT/ML IJ SOLN
0.0000 [IU] | Freq: Three times a day (TID) | INTRAMUSCULAR | Status: DC
  Administered 2023-02-02: 4 [IU] via SUBCUTANEOUS
  Administered 2023-02-03: 7 [IU] via SUBCUTANEOUS

## 2023-02-02 MED ORDER — PROPOFOL 10 MG/ML IV BOLUS
INTRAVENOUS | Status: AC
  Filled 2023-02-02: qty 20

## 2023-02-02 MED ORDER — PHENYLEPHRINE HCL-NACL 20-0.9 MG/250ML-% IV SOLN
INTRAVENOUS | Status: AC
  Filled 2023-02-02: qty 250

## 2023-02-02 MED ORDER — DEXMEDETOMIDINE HCL IN NACL 80 MCG/20ML IV SOLN
INTRAVENOUS | Status: DC | PRN
  Administered 2023-02-02: 8 ug via INTRAVENOUS

## 2023-02-02 MED ORDER — LIDOCAINE HCL (PF) 2 % IJ SOLN
INTRAMUSCULAR | Status: AC
  Filled 2023-02-02: qty 5

## 2023-02-02 MED ORDER — CEFAZOLIN SODIUM-DEXTROSE 2-4 GM/100ML-% IV SOLN
INTRAVENOUS | Status: AC
Start: 2023-02-02 — End: ?
  Filled 2023-02-02: qty 100

## 2023-02-02 MED ORDER — FENTANYL CITRATE (PF) 100 MCG/2ML IJ SOLN
25.0000 ug | INTRAMUSCULAR | Status: DC | PRN
  Administered 2023-02-02: 25 ug via INTRAVENOUS
  Administered 2023-02-02: 50 ug via INTRAVENOUS
  Administered 2023-02-02: 25 ug via INTRAVENOUS

## 2023-02-02 MED ORDER — MIDAZOLAM HCL 2 MG/2ML IJ SOLN
INTRAMUSCULAR | Status: DC | PRN
  Administered 2023-02-02: 1 mg via INTRAVENOUS

## 2023-02-02 MED ORDER — ONDANSETRON HCL 4 MG PO TABS: 4.0000 mg | ORAL_TABLET | Freq: Four times a day (QID) | ORAL | Status: DC | PRN

## 2023-02-02 MED ORDER — ONDANSETRON HCL 4 MG/2ML IJ SOLN
4.0000 mg | Freq: Four times a day (QID) | INTRAMUSCULAR | Status: DC | PRN
Start: 2023-02-02 — End: 2023-02-03

## 2023-02-02 MED ORDER — ACETAMINOPHEN 325 MG PO TABS: 325.0000 mg | ORAL_TABLET | Freq: Four times a day (QID) | ORAL | Status: DC | PRN

## 2023-02-02 MED ORDER — ATORVASTATIN CALCIUM 10 MG PO TABS
40.0000 mg | ORAL_TABLET | Freq: Every day | ORAL | Status: DC
  Administered 2023-02-02: 40 mg via ORAL

## 2023-02-02 MED ORDER — TRANEXAMIC ACID-NACL 1000-0.7 MG/100ML-% IV SOLN
INTRAVENOUS | Status: AC
  Filled 2023-02-02: qty 100

## 2023-02-02 MED ORDER — KETOROLAC TROMETHAMINE 15 MG/ML IJ SOLN
7.5000 mg | Freq: Four times a day (QID) | INTRAMUSCULAR | Status: DC
  Administered 2023-02-02 – 2023-02-03 (×3): 7.5 mg via INTRAVENOUS

## 2023-02-02 MED ORDER — KETOROLAC TROMETHAMINE 15 MG/ML IJ SOLN
15.0000 mg | Freq: Once | INTRAMUSCULAR | Status: AC
  Administered 2023-02-02: 15 mg via INTRAVENOUS

## 2023-02-02 MED ORDER — METOCLOPRAMIDE HCL 5 MG/ML IJ SOLN: 5.0000 mg | Freq: Three times a day (TID) | INTRAMUSCULAR | Status: DC | PRN

## 2023-02-02 MED ORDER — FLUTICASONE PROPIONATE 50 MCG/ACT NA SUSP: 2.0000 | Freq: Every day | NASAL | Status: DC | PRN

## 2023-02-02 MED ORDER — FLEET ENEMA RE ENEM: 1.0000 | ENEMA | Freq: Once | RECTAL | Status: DC | PRN

## 2023-02-02 MED ORDER — ALBUTEROL SULFATE HFA 108 (90 BASE) MCG/ACT IN AERS
INHALATION_SPRAY | RESPIRATORY_TRACT | Status: AC
  Filled 2023-02-02: qty 6.7

## 2023-02-02 MED ORDER — ROCURONIUM BROMIDE 10 MG/ML (PF) SYRINGE
PREFILLED_SYRINGE | INTRAVENOUS | Status: AC
  Filled 2023-02-02: qty 10

## 2023-02-02 MED ORDER — FENTANYL CITRATE (PF) 100 MCG/2ML IJ SOLN
INTRAMUSCULAR | Status: DC | PRN
  Administered 2023-02-02 (×2): 50 ug via INTRAVENOUS

## 2023-02-02 MED ORDER — SODIUM CHLORIDE (PF) 0.9 % IJ SOLN
INTRAMUSCULAR | Status: DC | PRN
  Administered 2023-02-02: 60 mL via INTRAMUSCULAR

## 2023-02-02 MED ORDER — SODIUM CHLORIDE 0.9 % IR SOLN
Status: DC | PRN
  Administered 2023-02-02: 3000 mL

## 2023-02-02 MED ORDER — BUPIVACAINE LIPOSOME 1.3 % IJ SUSP
INTRAMUSCULAR | Status: AC
  Filled 2023-02-02: qty 20

## 2023-02-02 MED ORDER — AMLODIPINE BESYLATE 10 MG PO TABS
5.0000 mg | ORAL_TABLET | Freq: Every morning | ORAL | Status: DC
  Administered 2023-02-03: 5 mg via ORAL

## 2023-02-02 MED ORDER — DIPHENHYDRAMINE HCL 25 MG PO TABS
25.0000 mg | ORAL_TABLET | Freq: Four times a day (QID) | ORAL | Status: DC | PRN
  Administered 2023-02-02: 25 mg via ORAL
  Filled 2023-02-02 (×2): qty 1

## 2023-02-02 MED ORDER — OXYCODONE HCL 5 MG PO TABS
5.0000 mg | ORAL_TABLET | ORAL | Status: DC | PRN
  Administered 2023-02-02: 5 mg via ORAL
  Administered 2023-02-02: 10 mg via ORAL
  Administered 2023-02-03: 5 mg via ORAL

## 2023-02-02 MED ORDER — OXYCODONE HCL 5 MG PO TABS
ORAL_TABLET | ORAL | Status: AC
  Filled 2023-02-02: qty 1

## 2023-02-02 MED ORDER — SEVOFLURANE IN SOLN
RESPIRATORY_TRACT | Status: AC
  Filled 2023-02-02: qty 250

## 2023-02-02 MED ORDER — ALBUTEROL SULFATE (2.5 MG/3ML) 0.083% IN NEBU: 2.5000 mg | INHALATION_SOLUTION | Freq: Four times a day (QID) | RESPIRATORY_TRACT | Status: DC | PRN

## 2023-02-02 SURGICAL SUPPLY — 63 items
BASEPLATE GLENOSPHERE 25 (Plate) IMPLANT
BEARING HUMERAL SHLDER 36M STD (Shoulder) IMPLANT
BIT DRILL TWIST 2.7 (BIT) IMPLANT
BLADE SAW SAG 25X90X1.19 (BLADE) ×1 IMPLANT
CHLORAPREP W/TINT 26 (MISCELLANEOUS) ×1 IMPLANT
COOLER POLAR GLACIER W/PUMP (MISCELLANEOUS) ×1 IMPLANT
DRAPE INCISE IOBAN 66X45 STRL (DRAPES) ×1 IMPLANT
DRAPE SHEET LG 3/4 BI-LAMINATE (DRAPES) ×1 IMPLANT
DRAPE TABLE BACK 80X90 (DRAPES) ×1 IMPLANT
DRSG OPSITE POSTOP 4X8 (GAUZE/BANDAGES/DRESSINGS) ×1 IMPLANT
ELECT BLADE 6.5 EXT (BLADE) IMPLANT
ELECT CAUTERY BLADE 6.4 (BLADE) ×1 IMPLANT
ELECT REM PT RETURN 9FT ADLT (ELECTROSURGICAL) ×1
ELECTRODE REM PT RTRN 9FT ADLT (ELECTROSURGICAL) ×1 IMPLANT
GAUZE XEROFORM 1X8 LF (GAUZE/BANDAGES/DRESSINGS) ×1 IMPLANT
GLENOID SPHERE 36MM CVD +3 (Orthopedic Implant) IMPLANT
GLOVE BIO SURGEON STRL SZ 6 (GLOVE) IMPLANT
GLOVE BIO SURGEON STRL SZ7.5 (GLOVE) ×4 IMPLANT
GLOVE BIO SURGEON STRL SZ8 (GLOVE) ×4 IMPLANT
GLOVE BIOGEL PI IND STRL 8 (GLOVE) ×2 IMPLANT
GLOVE INDICATOR 8.0 STRL GRN (GLOVE) ×1 IMPLANT
GOWN STRL REUS W/ TWL LRG LVL3 (GOWN DISPOSABLE) ×2 IMPLANT
GOWN STRL REUS W/ TWL XL LVL3 (GOWN DISPOSABLE) ×1 IMPLANT
HANDLE YANKAUER SUCT OPEN TIP (MISCELLANEOUS) ×1 IMPLANT
HOOD PEEL AWAY T7 (MISCELLANEOUS) ×3 IMPLANT
IV NS IRRIG 3000ML ARTHROMATIC (IV SOLUTION) ×1 IMPLANT
KIT STABILIZATION SHOULDER (MISCELLANEOUS) ×1 IMPLANT
KIT TURNOVER KIT A (KITS) ×1 IMPLANT
MANIFOLD NEPTUNE II (INSTRUMENTS) ×1 IMPLANT
MASK FACE SPIDER DISP (MASK) ×1 IMPLANT
MAT ABSORB FLUID 56X50 GRAY (MISCELLANEOUS) ×1 IMPLANT
NDL MAYO CATGUT SZ1 (NEEDLE) IMPLANT
NDL SAFETY ECLIPSE 18X1.5 (NEEDLE) ×1 IMPLANT
NDL SPNL 20GX3.5 QUINCKE YW (NEEDLE) ×1 IMPLANT
NEEDLE MAYO CATGUT SZ1 (NEEDLE)
NEEDLE SPNL 20GX3.5 QUINCKE YW (NEEDLE) ×1
PACK ARTHROSCOPY SHOULDER (MISCELLANEOUS) ×1 IMPLANT
PAD ARMBOARD 7.5X6 YLW CONV (MISCELLANEOUS) ×1 IMPLANT
PAD WRAPON POLAR SHDR UNIV (MISCELLANEOUS) ×1 IMPLANT
PIN THREADED REVERSE (PIN) IMPLANT
PULSAVAC PLUS IRRIG FAN TIP (DISPOSABLE) ×1
SCREW BONE LOCKING 4.75X35X3.5 (Screw) IMPLANT
SCREW BONE STRL 6.5MMX30MM (Screw) IMPLANT
SCREW LOCKING 4.75MMX15MM (Screw) IMPLANT
SHOULDER HUMERAL BEAR 36M STD (Shoulder) ×1 IMPLANT
SLING ULTRA II LG (MISCELLANEOUS) IMPLANT
SLING ULTRA II M (MISCELLANEOUS) IMPLANT
SPONGE T-LAP 18X18 ~~LOC~~+RFID (SPONGE) ×2 IMPLANT
STAPLER SKIN PROX 35W (STAPLE) ×1 IMPLANT
STEM HUM MICRO SZ 9 (Stem) IMPLANT
SUT ETHIBOND 0 MO6 C/R (SUTURE) ×1 IMPLANT
SUT FIBERWIRE #2 38 BLUE 1/2 (SUTURE) ×5
SUT VIC AB 0 CT1 36 (SUTURE) ×1 IMPLANT
SUT VIC AB 2-0 CT1 TAPERPNT 27 (SUTURE) ×2 IMPLANT
SUTURE FIBERWR #2 38 BLUE 1/2 (SUTURE) ×4 IMPLANT
SYR 10ML LL (SYRINGE) ×1 IMPLANT
SYR 30ML LL (SYRINGE) ×1 IMPLANT
SYR TOOMEY 50ML (SYRINGE) ×1 IMPLANT
TIP FAN IRRIG PULSAVAC PLUS (DISPOSABLE) ×1 IMPLANT
TRAP FLUID SMOKE EVACUATOR (MISCELLANEOUS) ×1 IMPLANT
TRAY HUMERAL NEUTRAL EXT 6 (Shoulder) IMPLANT
WATER STERILE IRR 500ML POUR (IV SOLUTION) ×1 IMPLANT
WRAPON POLAR PAD SHDR UNIV (MISCELLANEOUS) ×1

## 2023-02-02 NOTE — Anesthesia Preprocedure Evaluation (Signed)
Anesthesia Evaluation  Patient identified by MRN, date of birth, ID band Patient awake    Reviewed: Allergy & Precautions, H&P , NPO status , reviewed documented beta blocker date and time   History of Anesthesia Complications Negative for: history of anesthetic complications  Airway Mallampati: III  TM Distance: >3 FB Neck ROM: limited    Dental  (+) Upper Dentures, Chipped, Missing, Poor Dentition, Dental Advidsory Given   Pulmonary neg shortness of breath, asthma , sleep apnea , neg recent URI, Current Smoker and Patient abstained from smoking. Pt states doesn't tolerate CPAP. OSA/GA risks discussed, education done   Pulmonary exam normal        Cardiovascular hypertension, (-) angina (-) Past MI and (-) Cardiac Stents Normal cardiovascular exam(-) dysrhythmias (-) Valvular Problems/Murmurs     Neuro/Psych Seizures -, Well Controlled,     GI/Hepatic ,GERD  Medicated and Controlled,,  Endo/Other  diabetes  Class 3 obesity  Renal/GU      Musculoskeletal   Abdominal   Peds  Hematology   Anesthesia Other Findings Past Medical History: No date: Asthma No date: Diabetes (HCC) No date: GERD (gastroesophageal reflux disease) No date: High blood pressure No date: Sleep apnea No date: Tobacco use Past Surgical History: No date: ENDOVENOUS ABLATION SAPHENOUS VEIN W/ LASER No date: FOOT SURGERY No date: JOINT REPLACEMENT 05/16/2019: SHOULDER OPEN ROTATOR CUFF REPAIR; Left     Comment:  Procedure: ROTATOR CUFF REPAIR SHOULDER OPEN;  Surgeon:               Vickki Hearing, MD;  Location: [facility];  Service:               Orthopedics;  Laterality: Left;  with scalene block    Reproductive/Obstetrics                             Anesthesia Physical Anesthesia Plan  ASA: 3  Anesthesia Plan: General   Post-op Pain Management:    Induction: Intravenous  PONV Risk Score and Plan: 3 and  Treatment may vary due to age or medical condition, Ondansetron, Dexamethasone and Midazolam  Airway Management Planned: Oral ETT  Additional Equipment:   Intra-op Plan:   Post-operative Plan: Extubation in OR  Informed Consent: I have reviewed the patients History and Physical, chart, labs and discussed the procedure including the risks, benefits and alternatives for the proposed anesthesia with the patient or authorized representative who has indicated his/her understanding and acceptance.     Dental Advisory Given  Plan Discussed with: CRNA  Anesthesia Plan Comments:         Anesthesia Quick Evaluation

## 2023-02-02 NOTE — H&P (Signed)
History of Present Illness: Alyssa Patrick is a 63 y.o. female who presents today for her surgical history and physical for upcoming right reverse total shoulder arthroplasty scheduled with Dr. Joice Lofts on 02/02/2023. The patient denies any changes in her medical history since she was last evaluated. She denies any trauma or injury affecting the right shoulder since her last appointment. She reports a 6 out of 10 pain score at today's visit. She denies any numbness or ting into the right upper extremity. The patient is status post a left reverse total shoulder arthroplasty performed with Dr. Joice Lofts in the past. She denies any personal history of heart attack or stroke. The patient does have a history of asthma and sleep apnea. No personal history of DVT. The patient is a diabetic.  Past Medical History: Asthma, unspecified asthma severity, unspecified whether complicated, unspecified whether persistent (HHS-HCC)  Depression 2003  Diabetes mellitus without complication (CMS/HHS-HCC)  GERD (gastroesophageal reflux disease)  Hyperlipidemia  Hypertension  Pulmonary embolism (CMS/HHS-HCC) 05/2012  Seizures (CMS/HHS-HCC)  Sleep apnea 2019   Past Surgical History: Reverse left total shoulder arthroplasty Left 04/02/2020 (Dr. Joice Lofts)  ARTHROSCOPIC ROTATOR CUFF REPAIR Left  Foot surgery Bilateral  JOINT REPLACEMENT 2014/2017/2022 (Both knees, shoulder replacement  KNEE ARTHROSCOPY   Past Family History: Sleep apnea Brother   Medications: ACCU-CHEK GUIDE TEST STRIPS test strip once daily  ACCU-CHEK SOFTCLIX LANCETS lancets Use 1 each once daily  acetaminophen (TYLENOL) 500 MG tablet Take 500 mg by mouth as needed  albuterol 90 mcg/actuation inhaler Inhale 2 inhalations into the lungs  amLODIPine (NORVASC) 5 MG tablet Take 5 mg by mouth once daily  atorvastatin (LIPITOR) 40 MG tablet Take 40 mg by mouth once daily  BD ULTRA-FINE MINI PEN NEEDLE 31 gauge x 3/16" needle  bisacodyL (DULCOLAX) 5 mg EC tablet  Take by mouth  blood-glucose meter (ACCU-CHEK GUIDE GLUCOSE METER) Misc 1 each as directed  budesonide-glycopyrrolate-formoterol (BREZTRI AEROSPHERE) 160-9-4.8 mcg/actuation inhaler Inhale 2 inhalations into the lungs 2 (two) times daily  cetirizine (ZYRTEC) 10 MG tablet Take by mouth  clotrimazole (LOTRIMIN) 1 % cream Apply topically  cyclobenzaprine (FLEXERIL) 5 MG tablet Take 2 tablets (10 mg total) by mouth 3 (three) times daily as needed for Muscle spasms 60 tablet 0  diclofenac (VOLTAREN) 1 % topical gel Apply topically as needed  diphenhydrAMINE (BENADRYL) 25 mg tablet Take by mouth  ergocalciferol, vitamin D2, 1,250 mcg (50,000 unit) capsule Take by mouth  famotidine (PEPCID) 40 MG tablet TAKE 1 TABLET BY MOUTH EVERY DAY AT NIGHT 90 tablet 1  fluticasone propionate (FLONASE) 50 mcg/actuation nasal spray SPRAY 2 IN EACH NOSTRIL ONCE A DAY  hydrocortisone 2.5 % cream Apply topically  ibuprofen (MOTRIN) 800 MG tablet Take 1 tablet (800 mg total) by mouth every 8 (eight) hours as needed for Pain 40 tablet 2  ipratropium-albuteroL (DUO-NEB) nebulizer solution Inhale into the lungs  meloxicam (MOBIC) 7.5 MG tablet TAKE 1 TABLET BY MOUTH EVERY DAY 60 tablet 1  naproxen (NAPROSYN) 500 MG tablet TAKE 1 TABLET (500 MG TOTAL) BY MOUTH TWICE A DAY WITH FOOD 60 tablet 0  NOVOLOG FLEXPEN U-100 INSULIN pen injector (concentration 100 units/mL) INJECT 5 UNITS BEFORE MEALS AND AT BEDTIME SUBCUTANEOUS 4 TIMES A DAY 90 DAYS  pantoprazole (PROTONIX) 40 MG DR tablet Take 1 tablet (40 mg total) by mouth once daily Take 15-20 mins before meal 180 tablet 1  pen needle, diabetic (BD ULTRA-FINE MINI PEN NEEDLE) 31 gauge x 3/16" needle 31 each by abdominal subcutaneous  route as directed  semaglutide (OZEMPIC) 1 mg/dose (4 mg/3 mL) pen injector Inject 0.75 mLs (1 mg total) subcutaneously every 7 (seven) days 3 mL 12  tiZANidine (ZANAFLEX) 4 MG tablet TAKE 1 TABLET BY MOUTH 3 TIMES DAILY. 270 tablet 1  traMADoL  (ULTRAM) 50 mg tablet Take 1 tablet (50 mg total) by mouth every 8 (eight) hours as needed for Pain 20 tablet 0  triamcinolone 0.1 % cream   Allergies: Metronidazole Diarrhea and Other (See Comments)  Penicillin Hives   Review of Systems:  A comprehensive 14 point ROS was performed, reviewed by me today, and the pertinent orthopaedic findings are documented in the HPI.  Physical Exam: BP (!) 140/86  Ht 157.5 cm (5\' 2" )  Wt 93.9 kg (207 lb)  BMI 37.86 kg/m  General/Constitutional: The patient appears to be well-nourished, well-developed, and in no acute distress. Neuro/Psych: Normal mood and affect, oriented to person, place and time. Eyes: Non-icteric. Pupils are equal, round, and reactive to light, and exhibit synchronous movement. ENT: Unremarkable. Lymphatic: No palpable adenopathy. Respiratory: Lungs clear to auscultation, Normal chest excursion, No wheezes, and Non-labored breathing Cardiovascular: Regular rate and rhythm. No murmurs. and No edema, swelling or tenderness, except as noted in detailed exam. Integumentary: No impressive skin lesions present, except as noted in detailed exam. Musculoskeletal: Unremarkable, except as noted in detailed exam.  General: Well developed, well nourished 63 y.o. female in no apparent distress. Normal affect. Normal communication. Patient answers questions appropriately. The patient has a normal gait. There is no antalgic component. There is no hip lurch.   Cranial Nerves: Pupils equal round and reactive to light. Facial tone is symmetric. Facial sensation is symmetric. Shoulder shrug is symmetric. Tongue protrusion is midline. There is no pronator drift.  ROM of spine: The patient does have intact cervical flexion and extension without significant discomfort. Intact left and right bend with rotation without discomfort at today's visit. She is nontender palpation over the vertebral bodies of the cervical spine.  The patient does have  moderate tenderness to palpation along the medial border of the right scapula in addition to inferior aspect scapula, no scapular winging is identified. Tenderness to palpation over the subacromial space and anterior aspect the shoulder as well. The patient is able to forward flex close to 105 degrees, abduction close to 85 degrees. Increased pain at the extremes of motion. With the right arm abducted 90 degrees she can tolerate external rotation 85 degrees, internal rotation 70 degrees. She is able to reach behind her back to her right SI joint. The patient is tender palpation over the proximal bicep tendon. Moderate subacromial space tenderness as well. The patient has a positive drop arm test to the right upper extremity. Positive impingement testing of the right shoulder.  Strength: Side Biceps Triceps Deltoid Interossei Grip Wrist Ext. Wrist Flex.  R 5 5 5 5 5 5 5   L 5 5 5 5 5 5 5    Reflexes are 2+ and symmetric at the patella and achilles. Bilateral upper extremity sensation is intact to light touch. Clonus is not present. Toes are down-going. Gait is normal. No difficulty with tandem gait. Hoffman's is absent.  Rapid alternating movements are normal.   Vascular: The patient has less than 2 second capillary refill. The patient has normal ulnar and radial pulses. The patient has normal warmth to touch.   Imaging: True AP, Y-scapular, and axillary views of the right shoulder were obtained previously the office and reviewed by me today.  These films demonstrate no evidence for fractures, lytic lesions, or significant degenerative changes. The subacromial space is well-maintained. There is no subacromial or infra-clavicular spurring. She demonstrates a Type I acromion.  MRI OF THE RIGHT SHOULDER WITHOUT CONTRAST:  1. Moderate supraspinatus tendinosis with a 5 mm AP, 3 mm  medial-lateral insertional interstitial tear encompassing  approximately 50% of the tendon thickness.  2. Moderate  infraspinatus and subscapularis tendinosis.   Impression: 1. Nontraumatic incomplete tear of right rotator cuff. 2. Right rotator cuff tendonitis. 3. Biceps tendonitis on right. 4. Severe obesity (BMI 35.0-39.9) with comorbidity.  Plan:  1. Treatment options were discussed today with the patient. 2. The patient is scheduled for a right reverse total shoulder arthroplasty with Dr. Joice Lofts on 02/02/2023. 3. The patient was instructed on the risk and benefits of a right total shoulder arthroplasty at today's appointment. 4. This document will serve as a surgical history and physical for the patient. 6. The patient will follow-up per standard postop protocol. She can contact clinic if she has any questions, new symptoms develop or symptoms worsen.  The procedure was discussed with the patient, as were the potential risks (including bleeding, infection, nerve and/or blood vessel injury, persistent or recurrent pain, failure of the hardware, dislocation, axillary nerve injury, need for further surgery, blood clots, strokes, heart attacks and/or arhythmias, pneumonia, etc.) and benefits. The patient states her understanding and wishes to proceed.    H&P reviewed and patient re-examined. No changes.

## 2023-02-02 NOTE — Transfer of Care (Signed)
Immediate Anesthesia Transfer of Care Note  Patient: Alyssa Patrick  Procedure(s) Performed: REVERSE SHOULDER ARTHROPLASTY (Right: Shoulder)  Patient Location: PACU  Anesthesia Type:General  Level of Consciousness: drowsy  Airway & Oxygen Therapy: Patient Spontanous Breathing and Patient connected to face mask oxygen  Post-op Assessment: Report given to RN and Post -op Vital signs reviewed and stable  Post vital signs: Reviewed and stable  Last Vitals:  Vitals Value Taken Time  BP 117/70 02/02/23 1015  Temp 35.9 1015  Pulse 89 02/02/23 1017  Resp 16 02/02/23 1017  SpO2 100 % 02/02/23 1017  Vitals shown include unfiled device data.  Last Pain:  Vitals:   02/02/23 0627  PainSc: 2          Complications: No notable events documented.

## 2023-02-02 NOTE — Progress Notes (Signed)
PT Cancellation Note  Patient Details Name: Alyssa Patrick MRN: [id number] DOB: 1960/09/06   Cancelled Treatment:    Reason Eval/Treat Not Completed: Other (comment) (Per OT, pt to be admitted, but no caregivers here for education. RN reports success with OOB mobility at this time. WIll defer PT eval to next date as needed once OT education is complete. Pt able to AMB 400+ft with author in 2022 after CL rTSA.)    Noni Stonesifer C 02/02/2023, 5:10 PM

## 2023-02-02 NOTE — Anesthesia Procedure Notes (Signed)
Procedure Name: Intubation Date/Time: 02/02/2023 7:47 AM  Performed by: Morene Crocker, CRNAPre-anesthesia Checklist: Patient identified, Patient being monitored, Timeout performed, Emergency Drugs available and Suction available Patient Re-evaluated:Patient Re-evaluated prior to induction Oxygen Delivery Method: Circle system utilized Preoxygenation: Pre-oxygenation with 100% oxygen Induction Type: IV induction Ventilation: Mask ventilation without difficulty and Oral airway inserted - appropriate to patient size Laryngoscope Size: 3 and McGrath Grade View: Grade I Tube type: Oral Tube size: 7.0 mm Number of attempts: 1 Airway Equipment and Method: Stylet Placement Confirmation: ETT inserted through vocal cords under direct vision, positive ETCO2 and breath sounds checked- equal and bilateral Secured at: 22 cm Tube secured with: Tape Dental Injury: Teeth and Oropharynx as per pre-operative assessment  Comments: Smooth atraumatic intubation, no complications noted.

## 2023-02-02 NOTE — Plan of Care (Signed)
Problem: Pain Management: Goal: Pain level will decrease with appropriate interventions Outcome: Progressing

## 2023-02-02 NOTE — Op Note (Signed)
02/02/2023  1:05 PM  Patient:   Alyssa Patrick  Pre-Op Diagnosis:   Impingement/tendinopathy with early cuff arthropathy, right shoulder.  Post-Op Diagnosis:   Same  Procedure:   Reverse right total shoulder arthroplasty with biceps tenodesis.  Surgeon:   Maryagnes Amos, MD  Assistant:   Horris Latino, PA-C; Juanetta Snow, PA-S  Anesthesia:   General endotracheal with an interscalene block using Exparel placed preoperatively by the anesthesiologist.  Findings:   As above.  Complications:   None  EBL:   50 cc  Fluids:   450 cc crystalloid  UOP:   None  TT:   None  Drains:   None  Closure:   Staples  Implants:   All press-fit [employer] Comprehensive system with a #9 Identity micro-humeral stem, a -6 mm extended neutral Identity humeral tray with a +0 mm insert, and a mini-base plate with a 36 mm +3 mm lateralized glenosphere.  Brief Clinical Note:   The patient is a 63 year old female with a long history of progressively worsening pain and weakness of the right shoulder. The patient's symptoms have progressed despite medications, activity modification, etc. The patient's history and examination are consistent with a massive irreparable rotator cuff tear with cuff arthropathy, all of which were confirmed by MRI scan preoperatively. The patient presents at this time for a reverse right total shoulder arthroplasty with biceps tenodesis.  Procedure:   The patient underwent placement of an interscalene block using Exparel by the anesthesiologist in the preoperative holding area before being brought into the operating room and lain in the supine position. The patient then underwent general endotracheal intubation and anesthesia before the patient was repositioned in the beach chair position using the beach chair positioner. The right shoulder and upper extremity were prepped with ChloraPrep solution before being draped sterilely. Preoperative antibiotics were administered. A timeout  was performed to verify the appropriate surgical site.    A standard anterior approach to the shoulder was made through an approximately 4-5 inch incision. The incision was carried down through the subcutaneous tissues to expose the deltopectoral fascia. The interval between the deltoid and pectoralis muscles was identified and this plane developed, retracting the cephalic vein laterally with the deltoid muscle. The conjoined tendon was identified. Its lateral margin was dissected and the Kolbel self-retraining retractor inserted. The "three sisters" were identified and cauterized. Bursal tissues were removed to improve visualization.   The biceps tendon was identified near the inferior aspect of the bicipital groove. A soft tissue tenodesis was performed by attaching the biceps tendon to the adjacent pectoralis major tendon using two #0 Ethibond interrupted sutures. The biceps tendon was then transected just proximal to the tenodesis site. The subscapularis tendon was released from its attachment to the lesser tuberosity 1 cm proximal to its insertion and several tagging sutures placed. The inferior capsule was released with care after identifying and protecting the axillary nerve. The proximal humeral cut was made at approximately 25 of retroversion using the extra-medullary guide.   Attention was redirected to the glenoid. The labrum was debrided circumferentially before the center of the glenoid was marked with electrocautery. The guidewire was drilled into the glenoid vault using the appropriate guide. After verifying its position, it was overreamed with the mini-baseplate reamer to create a flat surface. The permanent mini-baseplate was impacted into place. It was stabilized with a 25 x 6.5 mm central screw and four peripheral locking screws. The permanent 36 mm glenosphere with +3 mm of lateralization was then impacted  into place and its Morse taper locking mechanism verified using manual  distraction.  Attention was directed to the humeral side. The humeral canal was reamed sequentially beginning with the end-cutting reamer then progressing from a 4 mm reamer up to a 9 mm reamer. This provided excellent circumferential chatter. The canal was broached beginning with a #7 broach and progressing to a #9 broach.  The plastic stem was inserted into the end of the broach and the proximal reaming performed. A trial reduction was performed using the -6 mm extended neutral humeral platform with the +0 mm insert. With the +0 mm insert, the arm demonstrated excellent range of motion as the hand could be brought across the chest to the opposite shoulder and brought to the top of the patient's head and to the patient's ear. The shoulder appeared stable throughout this range of motion. The joint was dislocated and the trial components removed.   The permanent #9 Identity micro-stem was connected with the -6 mm extended neutral humeral platform on the back table before this construct was impacted into place with care taken to maintain the appropriate version. The +0 mm insert was snapped into place. The shoulder was relocated using two finger pressure and again placed through a range of motion with the findings as described above.  The wound was copiously irrigated with sterile saline solution using the jet lavage system before a total of 30 cc of 0.5% Sensorcaine with epinephrine was injected into the pericapsular and peri-incisional tissues to help with postoperative analgesia. The subscapularis tendon was reapproximated using #2 FiberWire interrupted sutures. The deltopectoral interval was closed using #0 Vicryl interrupted sutures before the subcutaneous tissues were closed using 2-0 Vicryl interrupted sutures. The skin was closed using staples. Prior to closing the skin, 1 g of transexemic acid in 10 cc of normal saline was injected intra-articularly to help with postoperative bleeding. A sterile  occlusive dressing was applied to the wound before the arm was placed into a shoulder immobilizer with an abduction pillow. A Polar Care system also was applied to the shoulder. The patient was then transferred back to a hospital bed before being awakened, extubated, and returned to the recovery room in satisfactory condition after tolerating the procedure well.

## 2023-02-03 ENCOUNTER — Encounter: Payer: Self-pay | Admitting: Surgery

## 2023-02-03 DIAGNOSIS — M25811 Other specified joint disorders, right shoulder: Secondary | ICD-10-CM | POA: Diagnosis not present

## 2023-02-03 LAB — GLUCOSE, CAPILLARY: Glucose-Capillary: 233 mg/dL — ABNORMAL HIGH (ref 70–99)

## 2023-02-03 MED ORDER — ACETAMINOPHEN 500 MG PO TABS
ORAL_TABLET | ORAL | Status: AC
  Filled 2023-02-03: qty 2

## 2023-02-03 MED ORDER — OXYCODONE HCL 5 MG PO TABS: 5.0000 mg | ORAL_TABLET | ORAL | 0 refills | Status: AC | PRN

## 2023-02-03 MED ORDER — KETOROLAC TROMETHAMINE 15 MG/ML IJ SOLN
INTRAMUSCULAR | Status: AC
Start: 2023-02-03 — End: ?
  Filled 2023-02-03: qty 1

## 2023-02-03 MED ORDER — ENOXAPARIN SODIUM 30 MG/0.3ML IJ SOSY
PREFILLED_SYRINGE | INTRAMUSCULAR | Status: AC
  Filled 2023-02-03: qty 0.3

## 2023-02-03 MED ORDER — INSULIN ASPART 100 UNIT/ML IJ SOLN
INTRAMUSCULAR | Status: AC
  Filled 2023-02-03: qty 1

## 2023-02-03 MED ORDER — AMLODIPINE BESYLATE 10 MG PO TABS
ORAL_TABLET | ORAL | Status: AC
  Filled 2023-02-03: qty 1

## 2023-02-03 MED ORDER — ENOXAPARIN SODIUM 40 MG/0.4ML IJ SOSY
PREFILLED_SYRINGE | INTRAMUSCULAR | Status: AC
  Filled 2023-02-03: qty 0.4

## 2023-02-03 MED ORDER — ASPIRIN 325 MG PO TBEC: 325.0000 mg | DELAYED_RELEASE_TABLET | Freq: Every day | ORAL | 0 refills | Status: AC

## 2023-02-03 MED ORDER — OXYCODONE HCL 5 MG PO TABS
ORAL_TABLET | ORAL | Status: AC
  Filled 2023-02-03: qty 1

## 2023-02-03 MED ORDER — ONDANSETRON HCL 4 MG PO TABS: 4.0000 mg | ORAL_TABLET | Freq: Four times a day (QID) | ORAL | 0 refills | Status: AC | PRN

## 2023-02-03 NOTE — Anesthesia Postprocedure Evaluation (Signed)
Anesthesia Post Note  Patient: Alyssa Patrick  Procedure(s) Performed: REVERSE SHOULDER ARTHROPLASTY (Right: Shoulder)  Patient location during evaluation: PACU Anesthesia Type: General Level of consciousness: awake and alert Pain management: pain level controlled Vital Signs Assessment: post-procedure vital signs reviewed and stable Respiratory status: spontaneous breathing, nonlabored ventilation, respiratory function stable and patient connected to nasal cannula oxygen Cardiovascular status: blood pressure returned to baseline and stable Postop Assessment: no apparent nausea or vomiting Anesthetic complications: no   No notable events documented.   Last Vitals:  Vitals:   02/03/23 0606 02/03/23 0728  BP: 132/87 120/76  Pulse:  91  Resp:  16  Temp:  (!) 36.4 C  SpO2:  95%    Last Pain:  Vitals:   02/03/23 0728  TempSrc:   PainSc: 7                  Lenard Simmer

## 2023-02-03 NOTE — Progress Notes (Signed)
Patient met all discharge requirements to go home safely. Per Micah Noel patient is okay to go home without PT and OT because patient has had the other shoulder replaced also. Instructions, written prescriptions and extra dressings given to patient and spouse.

## 2023-02-03 NOTE — Discharge Summary (Signed)
Physician Discharge Summary  Patient ID: Alyssa Patrick MRN: [id number] DOB/AGE: [birth year] 65 y.o.  Admit date: 02/02/2023 Discharge date: 02/03/2023  Admission Diagnoses:  Status post reverse arthroplasty of shoulder, right [Z96.611] Impingement/tendinopathy with early cuff arthropathy, right shoulder.   Discharge Diagnoses: Patient Active Problem List   Diagnosis Date Noted   Status post reverse arthroplasty of shoulder, 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   Status post reverse arthroplasty of shoulder, left 04/02/2020   S/P arthroscopy of left shoulder 05/16/19 06/24/2019   Seizure-like activity (HCC) 03/18/2019    Past Medical History:  Diagnosis Date   Asthma    Diabetes mellitus type 2, insulin dependent (HCC)    GERD (gastroesophageal reflux disease)    Hyperlipidemia    Hypertension    Pneumonia    Sciatica    Seizure disorder (HCC)    started 2003; last seizure 2009; depakote stopped 2021   Sleep apnea    doesn't wear C-Pap since 2020   Tobacco use      Transfusion: None.   Consultants (if any):   Discharged Condition: Improved  Hospital Course: Alyssa Patrick is an 63 y.o. female who was admitted 02/02/2023 with a diagnosis of Impingement/tendinopathy with early cuff arthropathy, right shoulder and went to the operating room on 02/02/2023 and underwent the above named procedures.    Surgeries: Procedure(s): REVERSE SHOULDER ARTHROPLASTY on 02/02/2023 Patient tolerated the surgery well. Taken to PACU where she was stabilized and then transferred to the post op recovery area.  Started on Lovenox 40mg  q 24 hrs. Heels elevated on bed with rolled towels. No evidence of DVT. Negative Homan.  Patient's IV was removed on POD1.  Implants: All press-fit [employer] Comprehensive system with a #9 Identity micro-humeral stem, a -6 mm extended neutral Identity humeral tray with a +0 mm insert, and a mini-base plate with a 36 mm +3 mm lateralized glenosphere.   She was given perioperative  antibiotics:  Anti-infectives (From admission, onward)    Start     Dose/Rate Route Frequency Ordered Stop   02/02/23 1330  ceFAZolin (ANCEF) IVPB 2g/100 mL premix        2 g 200 mL/hr over 30 Minutes Intravenous Every 6 hours 02/02/23 1120 02/02/23 2051   02/02/23 0600  ceFAZolin (ANCEF) IVPB 2g/100 mL premix        2 g 200 mL/hr over 30 Minutes Intravenous On call to O.R. 02/01/23 2212 02/02/23 4098     .  She was given sequential compression devices, early ambulation, and Lovenox for DVT prophylaxis.  She benefited maximally from the hospital stay and there were no complications.    Recent vital signs:  Vitals:   02/03/23 0555 02/03/23 0606  BP: 132/87 132/87  Pulse: 94   Resp: 15   Temp: 97.9 F (36.6 C)   SpO2: 96%     Recent laboratory studies:  Lab Results  Component Value Date   HGB 13.6 01/25/2023   HGB 13.2 08/17/2021   HGB 12.2 11/15/2020   Lab Results  Component Value Date   WBC 8.1 01/25/2023   PLT 282 01/25/2023   No results found for: "INR" Lab Results  Component Value Date   NA 141 01/25/2023   K 4.4 01/25/2023   CL 107 01/25/2023   CO2 26 01/25/2023   BUN 13 01/25/2023   CREATININE 0.68 01/25/2023   GLUCOSE 79 01/25/2023    Discharge Medications:   Allergies as of 02/03/2023       Reactions   Metronidazole Diarrhea   Penicillin  G Hives        Medication List     TAKE these medications    albuterol 108 (90 Base) MCG/ACT inhaler Commonly known as: VENTOLIN HFA Inhale 1 puff into the lungs every 6 (six) hours as needed for shortness of breath or wheezing.   amLODipine 5 MG tablet Commonly known as: NORVASC Take 5 mg by mouth in the morning.   aspirin EC 325 MG tablet Take 1 tablet (325 mg total) by mouth daily.   atorvastatin 40 MG tablet Commonly known as: LIPITOR Take 40 mg by mouth daily.   diphenhydrAMINE 25 MG tablet Commonly known as: BENADRYL Take 25 mg by mouth every 6 (six) hours as needed for itching.    fluticasone 50 MCG/ACT nasal spray Commonly known as: FLONASE Place 2 sprays into both nostrils daily as needed for allergies.   naproxen 500 MG tablet Commonly known as: NAPROSYN Take 500 mg by mouth every 12 (twelve) hours as needed.   NovoLOG FlexPen 100 UNIT/ML FlexPen Generic drug: insulin aspart Inject 5 Units into the skin in the morning and at bedtime. HOLD FOR BLOOD GLUCOSE LESS THAN 100.   ondansetron 4 MG tablet Commonly known as: ZOFRAN Take 1 tablet (4 mg total) by mouth every 6 (six) hours as needed for nausea.   oxyCODONE 5 MG immediate release tablet Commonly known as: Oxy IR/ROXICODONE Take 1-2 tablets (5-10 mg total) by mouth every 4 (four) hours as needed for moderate pain (pain score 4-6).   Ozempic (1 MG/DOSE) 4 MG/3ML Sopn Generic drug: Semaglutide (1 MG/DOSE) Inject 1 mg into the skin every Sunday.   tiZANidine 4 MG tablet Commonly known as: ZANAFLEX TAKE 1 TABLET BY MOUTH THREE TIMES A DAY What changed:  when to take this reasons to take this        Diagnostic Studies: DG Shoulder Right Port Result Date: 02/02/2023 CLINICAL DATA:  Status post reverse shoulder arthroplasty. EXAM: RIGHT SHOULDER - 1 VIEW COMPARISON:  None Available. FINDINGS: Reverse right shoulder arthroplasty in expected alignment. No periprosthetic lucency or fracture. Recent postsurgical change includes air and edema in the joint space and soft tissues. Overlying skin staples in place. IMPRESSION: Reverse shoulder arthroplasty without immediate postoperative complication. Electronically Signed   By: Narda Rutherford M.D.   On: 02/02/2023 11:37    Disposition: Discharge home with HHPT.     Follow-up Information     Anson Oregon, PA-C Follow up in 14 day(s).   Specialty: Physician Assistant Why: Mindi Slicker information: [address] [place] [address] [phone]                  Signed: Meriel Pica PA-C 02/03/2023, 7:25 AM

## 2023-02-03 NOTE — Plan of Care (Signed)
  Problem: Clinical Measurements: Goal: Respiratory complications will improve Outcome: Progressing   Problem: Activity: Goal: Risk for activity intolerance will decrease Outcome: Progressing   Problem: Coping: Goal: Level of anxiety will decrease Outcome: Progressing   Problem: Elimination: Goal: Will not experience complications related to urinary retention Outcome: Progressing   Problem: Pain Managment: Goal: General experience of comfort will improve and/or be controlled Outcome: Progressing

## 2023-02-03 NOTE — Progress Notes (Signed)
  Subjective: 1 Day Post-Op Procedure(s) (LRB): REVERSE SHOULDER ARTHROPLASTY (Right) Patient reports pain as mild.   Patient is well, and has had no acute complaints or problems Plan is to go Home after hospital stay. Negative for chest pain and shortness of breath Fever: no Gastrointestinal:Negative for nausea and vomiting  Objective: Vital signs in last 24 hours: Temp:  [97 F (36.1 C)-98.3 F (36.8 C)] 97.9 F (36.6 C) (01/31 0555) Pulse Rate:  [81-100] 94 (01/31 0555) Resp:  [11-27] 15 (01/31 0555) BP: (106-155)/(70-94) 132/87 (01/31 0606) SpO2:  [87 %-100 %] 96 % (01/31 0555)  Intake/Output from previous day:  Intake/Output Summary (Last 24 hours) at 02/03/2023 0721 Last data filed at 02/02/2023 1550 Gross per 24 hour  Intake 1100 ml  Output 200 ml  Net 900 ml    Intake/Output this shift: No intake/output data recorded.  Labs: No results for input(s): "HGB" in the last 72 hours. No results for input(s): "WBC", "RBC", "HCT", "PLT" in the last 72 hours. No results for input(s): "NA", "K", "CL", "CO2", "BUN", "CREATININE", "GLUCOSE", "CALCIUM" in the last 72 hours. No results for input(s): "LABPT", "INR" in the last 72 hours.   EXAM General - Patient is Alert, Appropriate, and Oriented Extremity - ABD soft Neurovascular intact Dorsiflexion/Plantar flexion intact Incision: dressing C/D/I No cellulitis present Dressing/Incision - clean, dry, no drainage Motor Function - intact, moving foot and toes well on exam.  Abdomen soft on palpation with intact bowel sounds.  Past Medical History:  Diagnosis Date   Asthma    Diabetes mellitus type 2, insulin dependent (HCC)    GERD (gastroesophageal reflux disease)    Hyperlipidemia    Hypertension    Pneumonia    Sciatica    Seizure disorder (HCC)    started 2003; last seizure 2009; depakote stopped 2021   Sleep apnea    doesn't wear C-Pap since 2020   Tobacco use     Assessment/Plan: 1 Day Post-Op  Procedure(s) (LRB): REVERSE SHOULDER ARTHROPLASTY (Right) Principal Problem:   Status post reverse arthroplasty of shoulder, right  Estimated body mass index is 37.86 kg/m as calculated from the following:   Height as of this encounter: 5\' 2"  (1.575 m).   Weight as of 01/25/23: 93.9 kg. Advance diet Up with therapy D/C IV fluids when tolerating po intake.  Vitals reviewed this AM. Patient is feeling good, reports she remembers all the exercises from previous surgeries, would like to go home. Plan for discharge home today with HHPT.  DVT Prophylaxis - Lovenox and TED hose NWB to the right arm.  Valeria Batman, PA-C [facility] 02/03/2023, 7:21 AM

## 2023-02-03 NOTE — Discharge Instructions (Signed)
Diet: As you were doing prior to hospitalization  ? ?Shower:  May shower but keep the wounds dry, use an occlusive plastic wrap, NO SOAKING IN TUB.  If the bandage gets wet, change with a clean dry gauze. ? ?Dressing:  You may change your dressing as needed. Change the dressing with sterile gauze dressing.   ? ?Activity:  Increase activity slowly as tolerated, but follow the weight bearing instructions below.  No lifting or driving for 6 weeks. ? ?Weight Bearing:   Non-weightbearing to the right arm. ? ?To prevent constipation: you may use a stool softener such as - ? ?Colace (over the counter) 100 mg by mouth twice a day  ?Drink plenty of fluids (prune juice may be helpful) and high fiber foods ?Miralax (over the counter) for constipation as needed.   ? ?Itching:  If you experience itching with your medications, try taking only a single pain pill, or even half a pain pill at a time.  You may take up to 10 pain pills per day, and you can also use benadryl over the counter for itching or also to help with sleep.  ? ?Precautions:  If you experience chest pain or shortness of breath - call 911 immediately for transfer to the hospital emergency department!! ? ?If you develop a fever greater that 101 F, purulent drainage from wound, increased redness or drainage from wound, or calf pain-Call [facility]                                              ?Follow- Up Appointment:  Please call for an appointment to be seen in 2 weeks at [facility]  ?

## 2023-02-22 ENCOUNTER — Ambulatory Visit (HOSPITAL_COMMUNITY): Payer: No Typology Code available for payment source | Admitting: Occupational Therapy

## 2023-03-03 ENCOUNTER — Encounter (HOSPITAL_COMMUNITY): Payer: Self-pay | Admitting: Occupational Therapy

## 2023-03-03 ENCOUNTER — Other Ambulatory Visit: Payer: Self-pay

## 2023-03-03 ENCOUNTER — Ambulatory Visit (HOSPITAL_COMMUNITY): Payer: No Typology Code available for payment source | Attending: Family Medicine | Admitting: Occupational Therapy

## 2023-03-03 DIAGNOSIS — M25611 Stiffness of right shoulder, not elsewhere classified: Secondary | ICD-10-CM | POA: Diagnosis present

## 2023-03-03 DIAGNOSIS — M25511 Pain in right shoulder: Secondary | ICD-10-CM | POA: Diagnosis present

## 2023-03-03 DIAGNOSIS — R29898 Other symptoms and signs involving the musculoskeletal system: Secondary | ICD-10-CM | POA: Insufficient documentation

## 2023-03-03 NOTE — Therapy (Signed)
 OUTPATIENT OCCUPATIONAL THERAPY ORTHO EVALUATION  Patient Name: Alyssa Patrick MRN: [id number] DOB:[birth year], 63 y.o., female Today's Date: 03/03/2023   END OF SESSION:  OT End of Session - 03/03/23 0940     Visit Number 1    Number of Visits 8    Date for OT Re-Evaluation 05/02/23   progress note 03/31/23   Authorization Type 1) Medicare A & B 2) [employer]    Authorization Time Period no auth required    Progress Note Due on Visit 10    OT Start Time 0913    OT Stop Time [phone]    OT Time Calculation (min) 29 min    Activity Tolerance Patient tolerated treatment well    Behavior During Therapy [facility] for tasks assessed/performed             Past Medical History:  Diagnosis Date   Asthma    Diabetes mellitus type 2, insulin dependent (HCC)    GERD (gastroesophageal reflux disease)    Hyperlipidemia    Hypertension    Pneumonia    Sciatica    Seizure disorder (HCC)    started 2003; last seizure 2009; depakote stopped 2021   Sleep apnea    doesn't wear C-Pap since 2020   Tobacco use    Past Surgical History:  Procedure Laterality Date   COLONOSCOPY  2015   ENDOVENOUS ABLATION SAPHENOUS VEIN W/ LASER Bilateral 2015   FOOT SURGERY Bilateral    4 different surgeries each foot for bunion, hammertoe, nerves (screws bilateral great toes)   REVERSE SHOULDER ARTHROPLASTY Left 04/02/2020   Procedure: REVERSE SHOULDER ARTHROPLASTY;  Surgeon: Christena Flake, MD;  Location: [facility] ORS;  Service: Orthopedics;  Laterality: Left;   REVERSE SHOULDER ARTHROPLASTY Right 02/02/2023   Procedure: REVERSE SHOULDER ARTHROPLASTY;  Surgeon: Christena Flake, MD;  Location: [facility] ORS;  Service: Orthopedics;  Laterality: Right;   SHOULDER OPEN ROTATOR CUFF REPAIR Left 05/16/2019   Procedure: ROTATOR CUFF REPAIR SHOULDER OPEN;  Surgeon: Vickki Hearing, MD;  Location: [facility];  Service: Orthopedics;  Laterality: Left;  with scalene block    TOTAL KNEE ARTHROPLASTY Left 2017   TOTAL KNEE  ARTHROPLASTY Right 2014   Patient Active Problem List   Diagnosis Date Noted   Status post reverse arthroplasty of shoulder, 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   Status post reverse arthroplasty of shoulder, left 04/02/2020   S/P arthroscopy of left shoulder 05/16/19 06/24/2019   Seizure-like activity (HCC) 03/18/2019    PCP: Coral Ceo, FNP REFERRING PROVIDER: Blanchard Mane, PA-C  ONSET DATE: 02/02/23  REFERRING DIAG:  M75.110 (ICD-10-CM) - Nontraumatic incomplete tear of rotator cuff  M25.511,G89.29 (ICD-10-CM) - Chronic right shoulder pain  M75.81 (ICD-10-CM) - Rotator cuff tendonitis, right  M75.21 (ICD-10-CM) - Biceps tendinitis on right    THERAPY DIAG:  Acute pain of right shoulder  Other symptoms and signs involving the musculoskeletal system  Stiffness of right shoulder, not elsewhere classified  Rationale for Evaluation and Treatment: Rehabilitation  SUBJECTIVE:   SUBJECTIVE STATEMENT: S: "They've let me take the sling off at home."  Pt accompanied by: self  PERTINENT HISTORY: Pt is a 63 y/o female s/p right reverse TSA with bicep tenodesis on 02/02/23. Pt presents in sling, reporting she is able to remove her sling at home but wears in public.   PRECAUTIONS: Shoulder; Following standard protocol. Week 4-6 (2/27-3/13): AA/ROM standing, A/ROM supine. Week 7-8 (3/20-3/27): Wall climbs, A/ROM standing; Week 9-11 (4/3-4/17): light strengthening   WEIGHT BEARING RESTRICTIONS: Yes NWB  PAIN:  Are you having pain? No  FALLS: Has patient fallen in last 6 months? No  PLOF: Independent  PATIENT GOALS: To be able to use the RUE as normally as possible.   NEXT MD VISIT: 03/17/23  OBJECTIVE:   HAND DOMINANCE: Right  ADLs: Overall ADLs: Pt reports difficulty reaching overhead and behind her back. Pt is able to complete her ADLs fairly well. Cannot lift pots and pans, carry groceries. Pt reports she is sleeping well, tries not to roll onto the right side too much.   FUNCTIONAL  OUTCOME MEASURES: Upper Extremity Functional Scale (UEFS): 58/80  UPPER EXTREMITY ROM:       Assessed in sitting, er/IR adducted  Active ROM Right eval  Shoulder flexion 121  Shoulder abduction 120  Shoulder internal rotation 90  Shoulder external rotation 65  (Blank rows = not tested)    Assessed in supine, er/IR adducted  Passive ROM Right eval  Shoulder flexion 125  Shoulder abduction 125  Shoulder internal rotation 90  Shoulder external rotation 49  (Blank rows = not tested)   UPPER EXTREMITY MMT:     Eval: Assessed on observation due to precautions  MMT Right eval  Shoulder flexion 3/5  Shoulder abduction 3/5  Shoulder internal rotation 3/5  Shoulder external rotation 3/5  (Blank rows = not tested)  SENSATION: WFL  COGNITION: Overall cognitive status: Within functional limits for tasks assessed  OBSERVATIONS: Mod fascial restrictions along medial deltoid, trapezius, and scapular regions. Significant scar tissue along incision.    TODAY'S TREATMENT:                                                                                                                              DATE:  -AA/ROM:  protraction, flexion, abduction, horizontal abduction, er/IR, 10 reps   PATIENT EDUCATION: Education details: AA/ROM Person educated: Patient Education method: Programmer, multimedia, Facilities manager, and Handouts Education comprehension: verbalized understanding and returned demonstration  HOME EXERCISE PROGRAM: Eval: AA/ROM  GOALS: Goals reviewed with patient? Yes   SHORT TERM GOALS: Target date: 03/31/23  Pt will be provided with and educated on HEP to improve mobility in RUE required for use during ADL completion.   Goal status: INITIAL  2.  Pt will increase RUE P/ROM by 20+ degrees to improve ability to use RUE during dressing tasks with minimal compensatory techniques.   Goal status: INITIAL  3.  Pt will increase RUE strength to 3+/5 to improve ability to reach  for items at waist to chest height during bathing and grooming tasks.   Goal status: INITIAL    LONG TERM GOALS: Target date: 05/02/23  Pt will decrease pain in RUE to 3/10 or less to improve ability to sleep for 2+ consecutive hours without waking due to pain.   Goal status: INITIAL  2.  Pt will decrease RUE fascial restrictions to min amounts or less to improve mobility required for functional reaching tasks.   Goal status: INITIAL  3.  Pt will increase RUE A/ROM by 20+ degrees to improve ability to use RUE when reaching overhead or behind back during dressing and bathing tasks.   Goal status: INITIAL  4.  Pt will increase RUE strength to 4+/5 or greater to improve ability to use RUE when lifting or carrying items during meal preparation/housework/yardwork tasks.   Goal status: INITIAL  5.  Pt will return to highest level of function using RUE as dominant during functional task completion.   Goal status: INITIAL   ASSESSMENT:  CLINICAL IMPRESSION: Patient is a 63 y.o. female who was seen today for occupational therapy evaluation s/p right reverse TSA with bicep tenodesis on 02/02/23. Pt presents with increased pain and fascial restrictions, decreased ROM, strength, and functional use of the RUE. Pt has been completing exercises from prior TSA in 2022 and reports she can reach her hair and back to perform peri-care.   PERFORMANCE DEFICITS: in functional skills including in functional skills including ADLs, IADLs, coordination, tone, ROM, strength, pain, fascial restrictions, muscle spasms, and UE functional use  IMPAIRMENTS: are limiting patient from ADLs, IADLs, rest and sleep, and leisure.   COMORBIDITIES: has no other co-morbidities that affects occupational performance. Patient will benefit from skilled OT to address above impairments and improve overall function.  MODIFICATION OR ASSISTANCE TO COMPLETE EVALUATION: No modification of tasks or assist necessary to complete an  evaluation.  OT OCCUPATIONAL PROFILE AND HISTORY: Problem focused assessment: Including review of records relating to presenting problem.  CLINICAL DECISION MAKING: LOW - limited treatment options, no task modification necessary  REHAB POTENTIAL: Good  EVALUATION COMPLEXITY: Low      PLAN:  OT FREQUENCY: 1x/week  OT DURATION: 8 weeks  PLANNED INTERVENTIONS: 97168 OT Re-evaluation, 97535 self care/ADL training, 40981 therapeutic exercise, 97530 therapeutic activity, 97112 neuromuscular re-education, 97140 manual therapy, 97035 ultrasound, 97014 electrical stimulation unattended, patient/family education, and DME and/or AE instructions  RECOMMENDED OTHER SERVICES: None at this time  CONSULTED AND AGREED WITH PLAN OF CARE: Patient  PLAN FOR NEXT SESSION: Follow up on HEP, AA/ROM progressing to A/ROM and strengthening as protocol allows   Ezra Sites, OTR/L  [phone] 03/03/2023, 9:46 AM

## 2023-03-03 NOTE — Patient Instructions (Signed)

## 2023-03-08 ENCOUNTER — Encounter (HOSPITAL_COMMUNITY): Payer: Medicare Other | Admitting: Occupational Therapy

## 2023-03-09 ENCOUNTER — Encounter (HOSPITAL_COMMUNITY): Payer: Self-pay | Admitting: Occupational Therapy

## 2023-03-09 ENCOUNTER — Ambulatory Visit (HOSPITAL_COMMUNITY): Payer: Medicare Other | Attending: Family Medicine | Admitting: Occupational Therapy

## 2023-03-09 DIAGNOSIS — M25611 Stiffness of right shoulder, not elsewhere classified: Secondary | ICD-10-CM | POA: Insufficient documentation

## 2023-03-09 DIAGNOSIS — R29898 Other symptoms and signs involving the musculoskeletal system: Secondary | ICD-10-CM | POA: Insufficient documentation

## 2023-03-09 DIAGNOSIS — M25511 Pain in right shoulder: Secondary | ICD-10-CM | POA: Diagnosis present

## 2023-03-09 NOTE — Therapy (Signed)
 OUTPATIENT OCCUPATIONAL THERAPY ORTHO TREATMENT NOTE  Patient Name: Puja Caffey MRN: [id number] DOB:[birth year], 63 y.o., female Today's Date: 03/09/2023   END OF SESSION:  OT End of Session - 03/09/23 0853     Visit Number 2    Number of Visits 8    Date for OT Re-Evaluation 05/02/23   progress note 03/31/23   Authorization Type 1) Medicare A & B 2) [employer]    Authorization Time Period no auth required    Progress Note Due on Visit 10    OT Start Time 0815    OT Stop Time [phone]    OT Time Calculation (min) 38 min    Activity Tolerance Patient tolerated treatment well    Behavior During Therapy WFL for tasks assessed/performed             Past Medical History:  Diagnosis Date   Asthma    Diabetes mellitus type 2, insulin dependent (HCC)    GERD (gastroesophageal reflux disease)    Hyperlipidemia    Hypertension    Pneumonia    Sciatica    Seizure disorder (HCC)    started 2003; last seizure 2009; depakote stopped 2021   Sleep apnea    doesn't wear C-Pap since 2020   Tobacco use    Past Surgical History:  Procedure Laterality Date   COLONOSCOPY  2015   ENDOVENOUS ABLATION SAPHENOUS VEIN W/ LASER Bilateral 2015   FOOT SURGERY Bilateral    4 different surgeries each foot for bunion, hammertoe, nerves (screws bilateral great toes)   REVERSE SHOULDER ARTHROPLASTY Left 04/02/2020   Procedure: REVERSE SHOULDER ARTHROPLASTY;  Surgeon: Christena Flake, MD;  Location: [facility];  Service: Orthopedics;  Laterality: Left;   REVERSE SHOULDER ARTHROPLASTY Right 02/02/2023   Procedure: REVERSE SHOULDER ARTHROPLASTY;  Surgeon: Christena Flake, MD;  Location: [facility];  Service: Orthopedics;  Laterality: Right;   SHOULDER OPEN ROTATOR CUFF REPAIR Left 05/16/2019   Procedure: ROTATOR CUFF REPAIR SHOULDER OPEN;  Surgeon: Vickki Hearing, MD;  Location: [facility];  Service: Orthopedics;  Laterality: Left;  with scalene block    TOTAL KNEE ARTHROPLASTY Left 2017   TOTAL KNEE  ARTHROPLASTY Right 2014   Patient Active Problem List   Diagnosis Date Noted   Status post reverse arthroplasty of shoulder, 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   Status post reverse arthroplasty of shoulder, left 04/02/2020   S/P arthroscopy of left shoulder 05/16/19 06/24/2019   Seizure-like activity (HCC) 03/18/2019    PCP: Coral Ceo, FNP REFERRING PROVIDER: Blanchard Mane, PA-C  ONSET DATE: 02/02/23  REFERRING DIAG:  M75.110 (ICD-10-CM) - Nontraumatic incomplete tear of rotator cuff  M25.511,G89.29 (ICD-10-CM) - Chronic right shoulder pain  M75.81 (ICD-10-CM) - Rotator cuff tendonitis, right  M75.21 (ICD-10-CM) - Biceps tendinitis on right    THERAPY DIAG:  Acute pain of right shoulder  Other symptoms and signs involving the musculoskeletal system  Stiffness of right shoulder, not elsewhere classified  Rationale for Evaluation and Treatment: Rehabilitation  SUBJECTIVE:   SUBJECTIVE STATEMENT: S: "I'm feeling great today"  Pt accompanied by: self  PERTINENT HISTORY: Pt is a 63 y/o female s/p right reverse TSA with bicep tenodesis on 02/02/23. Pt presents in sling, reporting she is able to remove her sling at home but wears in public.   PRECAUTIONS: Shoulder; Following standard protocol. Week 4-6 (2/27-3/13): AA/ROM standing, A/ROM supine. Week 7-8 (3/20-3/27): Wall climbs, A/ROM standing; Week 9-11 (4/3-4/17): light strengthening   WEIGHT BEARING RESTRICTIONS: Yes NWB  PAIN:  Are you  having pain? No  FALLS: Has patient fallen in last 6 months? No  PLOF: Independent  PATIENT GOALS: To be able to use the RUE as normally as possible.   NEXT MD VISIT: 03/17/23  OBJECTIVE:   HAND DOMINANCE: Right  ADLs: Overall ADLs: Pt reports difficulty reaching overhead and behind her back. Pt is able to complete her ADLs fairly well. Cannot lift pots and pans, carry groceries. Pt reports she is sleeping well, tries not to roll onto the right side too much.   FUNCTIONAL OUTCOME  MEASURES: Upper Extremity Functional Scale (UEFS): 58/80  UPPER EXTREMITY ROM:       Assessed in sitting, er/IR adducted  Active ROM Right eval  Shoulder flexion 121  Shoulder abduction 120  Shoulder internal rotation 90  Shoulder external rotation 65  (Blank rows = not tested)    Assessed in supine, er/IR adducted  Passive ROM Right eval  Shoulder flexion 125  Shoulder abduction 125  Shoulder internal rotation 90  Shoulder external rotation 49  (Blank rows = not tested)   UPPER EXTREMITY MMT:     Eval: Assessed on observation due to precautions  MMT Right eval  Shoulder flexion 3/5  Shoulder abduction 3/5  Shoulder internal rotation 3/5  Shoulder external rotation 3/5  (Blank rows = not tested)  SENSATION: WFL  COGNITION: Overall cognitive status: Within functional limits for tasks assessed  OBSERVATIONS: Mod fascial restrictions along medial deltoid, trapezius, and scapular regions. Significant scar tissue along incision.    TODAY'S TREATMENT:                                                                                                                              DATE:   03/09/23 -Manual therapy: myofascial release and trigger point applied to RUE biceps, deltoid, scapular region, and trapezius in order to reduce fascial restrictions and pain, as well as improve ROM. -P/ROM: flexion, abduction, er/IR, x8 -AA/ROM: supine, flexion, abduction, protraction, horizontal abduction, er/IR, x10 -Scapular ROM: elevation/depression, retraction/protraction, rows, x10 -Wall Wash: 2x60" -Thumb tacs: 2x60" -Pulleys: flexion, abduction, x60"    PATIENT EDUCATION: Education details: Continue HEP Person educated: Patient Education method: Explanation, Demonstration, and Handouts Education comprehension: verbalized understanding and returned demonstration  HOME EXERCISE PROGRAM: Eval: AA/ROM  GOALS: Goals reviewed with patient? Yes   SHORT TERM GOALS: Target  date: 03/31/23  Pt will be provided with and educated on HEP to improve mobility in RUE required for use during ADL completion.   Goal status: IN PROGRESS  2.  Pt will increase RUE P/ROM by 20+ degrees to improve ability to use RUE during dressing tasks with minimal compensatory techniques.   Goal status: IN PROGRESS  3.  Pt will increase RUE strength to 3+/5 to improve ability to reach for items at waist to chest height during bathing and grooming tasks.   Goal status: IN PROGRESS    LONG TERM GOALS: Target date: 05/02/23  Pt will decrease pain  in RUE to 3/10 or less to improve ability to sleep for 2+ consecutive hours without waking due to pain.   Goal status: IN PROGRESS  2.  Pt will decrease RUE fascial restrictions to min amounts or less to improve mobility required for functional reaching tasks.   Goal status: IN PROGRESS  3.  Pt will increase RUE A/ROM by 20+ degrees to improve ability to use RUE when reaching overhead or behind back during dressing and bathing tasks.   Goal status: IN PROGRESS  4.  Pt will increase RUE strength to 4+/5 or greater to improve ability to use RUE when lifting or carrying items during meal preparation/housework/yardwork tasks.   Goal status: IN PROGRESS  5.  Pt will return to highest level of function using RUE as dominant during functional task completion.   Goal status: IN PROGRESS   ASSESSMENT:  CLINICAL IMPRESSION: This session pt reports no pain on entry, however she reports with manual and ROM tasks that she is getting sore and fatigued quickly during the session. She required increased time and rest breaks due to increased soreness with tasks. OT adding low level endurance based tasks, as well as pulleys to assist with improving ROM and tolerance. Verbal and tactile cuing provided for positioning and technique throughout session.   PERFORMANCE DEFICITS: in functional skills including in functional skills including ADLs, IADLs,  coordination, tone, ROM, strength, pain, fascial restrictions, muscle spasms, and UE functional use   PLAN:  OT FREQUENCY: 1x/week  OT DURATION: 8 weeks  PLANNED INTERVENTIONS: 97168 OT Re-evaluation, 97535 self care/ADL training, 16109 therapeutic exercise, 97530 therapeutic activity, 97112 neuromuscular re-education, 97140 manual therapy, 97035 ultrasound, 97014 electrical stimulation unattended, patient/family education, and DME and/or AE instructions  RECOMMENDED OTHER SERVICES: None at this time  CONSULTED AND AGREED WITH PLAN OF CARE: Patient  PLAN FOR NEXT SESSION: Follow up on HEP, AA/ROM progressing to A/ROM and strengthening as protocol allows   Trish Mage, OTR/L [phone] 03/09/2023, 10:03 AM

## 2023-03-13 ENCOUNTER — Encounter (HOSPITAL_COMMUNITY): Payer: Medicare Other | Admitting: Occupational Therapy

## 2023-03-14 ENCOUNTER — Ambulatory Visit (HOSPITAL_COMMUNITY): Payer: Medicare Other | Admitting: Occupational Therapy

## 2023-03-14 ENCOUNTER — Encounter (HOSPITAL_COMMUNITY): Payer: Self-pay | Admitting: Occupational Therapy

## 2023-03-14 DIAGNOSIS — M25511 Pain in right shoulder: Secondary | ICD-10-CM | POA: Diagnosis not present

## 2023-03-14 DIAGNOSIS — R29898 Other symptoms and signs involving the musculoskeletal system: Secondary | ICD-10-CM

## 2023-03-14 DIAGNOSIS — M25611 Stiffness of right shoulder, not elsewhere classified: Secondary | ICD-10-CM

## 2023-03-14 NOTE — Therapy (Signed)
 OUTPATIENT OCCUPATIONAL THERAPY ORTHO TREATMENT NOTE  Patient Name: Alyssa Patrick MRN: [id number] DOB:[birth year], 63 y.o., female Today's Date: 03/14/2023   END OF SESSION:  OT End of Session - 03/14/23 0850     Visit Number 3    Number of Visits 8    Date for OT Re-Evaluation 05/02/23   progress note 03/31/23   Authorization Type 1) Medicare A & B 2) [employer]    Authorization Time Period no auth required    Progress Note Due on Visit 10    OT Start Time 0803    OT Stop Time [phone]    OT Time Calculation (min) 43 min    Activity Tolerance Patient tolerated treatment well    Behavior During Therapy [facility] for tasks assessed/performed              Past Medical History:  Diagnosis Date   Asthma    Diabetes mellitus type 2, insulin dependent (HCC)    GERD (gastroesophageal reflux disease)    Hyperlipidemia    Hypertension    Pneumonia    Sciatica    Seizure disorder (HCC)    started 2003; last seizure 2009; depakote stopped 2021   Sleep apnea    doesn't wear C-Pap since 2020   Tobacco use    Past Surgical History:  Procedure Laterality Date   COLONOSCOPY  2015   ENDOVENOUS ABLATION SAPHENOUS VEIN W/ LASER Bilateral 2015   FOOT SURGERY Bilateral    4 different surgeries each foot for bunion, hammertoe, nerves (screws bilateral great toes)   REVERSE SHOULDER ARTHROPLASTY Left 04/02/2020   Procedure: REVERSE SHOULDER ARTHROPLASTY;  Surgeon: Christena Flake, MD;  Location: [facility];  Service: Orthopedics;  Laterality: Left;   REVERSE SHOULDER ARTHROPLASTY Right 02/02/2023   Procedure: REVERSE SHOULDER ARTHROPLASTY;  Surgeon: Christena Flake, MD;  Location: [facility];  Service: Orthopedics;  Laterality: Right;   SHOULDER OPEN ROTATOR CUFF REPAIR Left 05/16/2019   Procedure: ROTATOR CUFF REPAIR SHOULDER OPEN;  Surgeon: Vickki Hearing, MD;  Location: [facility];  Service: Orthopedics;  Laterality: Left;  with scalene block    TOTAL KNEE ARTHROPLASTY Left 2017   TOTAL KNEE  ARTHROPLASTY Right 2014   Patient Active Problem List   Diagnosis Date Noted   Status post reverse arthroplasty of shoulder, 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   Status post reverse arthroplasty of shoulder, left 04/02/2020   S/P arthroscopy of left shoulder 05/16/19 06/24/2019   Seizure-like activity (HCC) 03/18/2019    PCP: Coral Ceo, FNP REFERRING PROVIDER: Blanchard Mane, PA-C  ONSET DATE: 02/02/23  REFERRING DIAG:  M75.110 (ICD-10-CM) - Nontraumatic incomplete tear of rotator cuff  M25.511,G89.29 (ICD-10-CM) - Chronic right shoulder pain  M75.81 (ICD-10-CM) - Rotator cuff tendonitis, right  M75.21 (ICD-10-CM) - Biceps tendinitis on right    THERAPY DIAG:  Acute pain of right shoulder  Other symptoms and signs involving the musculoskeletal system  Stiffness of right shoulder, not elsewhere classified  Rationale for Evaluation and Treatment: Rehabilitation  SUBJECTIVE:   SUBJECTIVE STATEMENT: S: "I'm feeling great today"  Pt accompanied by: self  PERTINENT HISTORY: Pt is a 63 y/o female s/p right reverse TSA with bicep tenodesis on 02/02/23. Pt presents in sling, reporting she is able to remove her sling at home but wears in public.   PRECAUTIONS: Shoulder; Following standard protocol. Week 4-6 (2/27-3/13): AA/ROM standing, A/ROM supine. Week 7-8 (3/20-3/27): Wall climbs, A/ROM standing; Week 9-11 (4/3-4/17): light strengthening   WEIGHT BEARING RESTRICTIONS: Yes NWB  PAIN:  Are  you having pain? No  FALLS: Has patient fallen in last 6 months? No  PLOF: Independent  PATIENT GOALS: To be able to use the RUE as normally as possible.   NEXT MD VISIT: 03/17/23  OBJECTIVE:   HAND DOMINANCE: Right  ADLs: Overall ADLs: Pt reports difficulty reaching overhead and behind her back. Pt is able to complete her ADLs fairly well. Cannot lift pots and pans, carry groceries. Pt reports she is sleeping well, tries not to roll onto the right side too much.   FUNCTIONAL OUTCOME  MEASURES: Upper Extremity Functional Scale (UEFS): 58/80  UPPER EXTREMITY ROM:       Assessed in sitting, er/IR adducted  Active ROM Right eval  Shoulder flexion 121  Shoulder abduction 120  Shoulder internal rotation 90  Shoulder external rotation 65  (Blank rows = not tested)    Assessed in supine, er/IR adducted  Passive ROM Right eval  Shoulder flexion 125  Shoulder abduction 125  Shoulder internal rotation 90  Shoulder external rotation 49  (Blank rows = not tested)   UPPER EXTREMITY MMT:     Eval: Assessed on observation due to precautions  MMT Right eval  Shoulder flexion 3/5  Shoulder abduction 3/5  Shoulder internal rotation 3/5  Shoulder external rotation 3/5  (Blank rows = not tested)  SENSATION: WFL  COGNITION: Overall cognitive status: Within functional limits for tasks assessed  OBSERVATIONS: Mod fascial restrictions along medial deltoid, trapezius, and scapular regions. Significant scar tissue along incision.    TODAY'S TREATMENT:                                                                                                                              DATE:   03/14/23 -Manual therapy: myofascial release and trigger point applied to RUE biceps, deltoid, scapular region, and trapezius in order to reduce fascial restrictions and pain, as well as improve ROM. -P/ROM: flexion, abduction, er/IR, x8 -AA/ROM: supine, flexion, abduction, protraction, horizontal abduction, er/IR, x10 -A/ROM: supine, flexion, abduction, protraction, horizontal abduction, er/IR, x10 -Scapular ROM: elevation/depression, retraction/protraction, rows, x10 -AA/ROM: seated, flexion, abduction, protraction, horizontal abduction, er/IR, x10  03/09/23 -Manual therapy: myofascial release and trigger point applied to RUE biceps, deltoid, scapular region, and trapezius in order to reduce fascial restrictions and pain, as well as improve ROM. -P/ROM: flexion, abduction, er/IR,  x8 -AA/ROM: supine, flexion, abduction, protraction, horizontal abduction, er/IR, x10 -Scapular ROM: elevation/depression, retraction/protraction, rows, x10 -Wall Wash: 2x60" -Thumb tacs: 2x60" -Pulleys: flexion, abduction, x60"    PATIENT EDUCATION: Education details: A/ROM Person educated: Patient Education method: Programmer, multimedia, Facilities manager, and Handouts Education comprehension: verbalized understanding and returned demonstration  HOME EXERCISE PROGRAM: Eval: AA/ROM 3/11: A/ROM  GOALS: Goals reviewed with patient? Yes   SHORT TERM GOALS: Target date: 03/31/23  Pt will be provided with and educated on HEP to improve mobility in RUE required for use during ADL completion.   Goal status: IN PROGRESS  2.  Pt will increase  RUE P/ROM by 20+ degrees to improve ability to use RUE during dressing tasks with minimal compensatory techniques.   Goal status: IN PROGRESS  3.  Pt will increase RUE strength to 3+/5 to improve ability to reach for items at waist to chest height during bathing and grooming tasks.   Goal status: IN PROGRESS    LONG TERM GOALS: Target date: 05/02/23  Pt will decrease pain in RUE to 3/10 or less to improve ability to sleep for 2+ consecutive hours without waking due to pain.   Goal status: IN PROGRESS  2.  Pt will decrease RUE fascial restrictions to min amounts or less to improve mobility required for functional reaching tasks.   Goal status: IN PROGRESS  3.  Pt will increase RUE A/ROM by 20+ degrees to improve ability to use RUE when reaching overhead or behind back during dressing and bathing tasks.   Goal status: IN PROGRESS  4.  Pt will increase RUE strength to 4+/5 or greater to improve ability to use RUE when lifting or carrying items during meal preparation/housework/yardwork tasks.   Goal status: IN PROGRESS  5.  Pt will return to highest level of function using RUE as dominant during functional task completion.   Goal status: IN  PROGRESS   ASSESSMENT:  CLINICAL IMPRESSION: This session pt having mild pain in her clavicle, stemming down from her neck. She was able to work on multiple ROM exercises this session, including adding A/ROM in supine. She is demonstrating 75-80% of full ROM. Pt's pain increased to 5/10 with muscle fatigue towards the end of the session. Verbal and tactile cuing provided for positioning and technique throughout session.   PERFORMANCE DEFICITS: in functional skills including in functional skills including ADLs, IADLs, coordination, tone, ROM, strength, pain, fascial restrictions, muscle spasms, and UE functional use   PLAN:  OT FREQUENCY: 1x/week  OT DURATION: 8 weeks  PLANNED INTERVENTIONS: 97168 OT Re-evaluation, 97535 self care/ADL training, 78295 therapeutic exercise, 97530 therapeutic activity, 97112 neuromuscular re-education, 97140 manual therapy, 97035 ultrasound, 97014 electrical stimulation unattended, patient/family education, and DME and/or AE instructions  RECOMMENDED OTHER SERVICES: None at this time  CONSULTED AND AGREED WITH PLAN OF CARE: Patient  PLAN FOR NEXT SESSION: Follow up on HEP, AA/ROM progressing to A/ROM and strengthening as protocol allows   Trish Mage, OTR/L [phone] 03/14/2023, 8:51 AM

## 2023-03-14 NOTE — Patient Instructions (Signed)

## 2023-03-23 ENCOUNTER — Ambulatory Visit (HOSPITAL_COMMUNITY): Admitting: Occupational Therapy

## 2023-03-23 ENCOUNTER — Encounter (HOSPITAL_COMMUNITY): Payer: Self-pay | Admitting: Occupational Therapy

## 2023-03-23 DIAGNOSIS — M25511 Pain in right shoulder: Secondary | ICD-10-CM

## 2023-03-23 DIAGNOSIS — M25611 Stiffness of right shoulder, not elsewhere classified: Secondary | ICD-10-CM

## 2023-03-23 DIAGNOSIS — R29898 Other symptoms and signs involving the musculoskeletal system: Secondary | ICD-10-CM

## 2023-03-23 NOTE — Therapy (Signed)
 OUTPATIENT OCCUPATIONAL THERAPY ORTHO TREATMENT NOTE  Patient Name: Alyssa Patrick MRN: [id number] DOB:Nov 02, 1960, 63 y.o., female Today's Date: 03/23/2023   END OF SESSION:  OT End of Session - 03/23/23 0846     Visit Number 4    Number of Visits 8    Date for OT Re-Evaluation 05/02/23   progress note 03/31/23   Authorization Type 1) Medicare A & B 2) [employer]    Authorization Time Period no auth required    Progress Note Due on Visit 10    OT Start Time 0808    OT Stop Time [phone]    OT Time Calculation (min) 38 min    Activity Tolerance Patient tolerated treatment well    Behavior During Therapy [facility] for tasks assessed/performed             Past Medical History:  Diagnosis Date   Asthma    Diabetes mellitus type 2, insulin dependent (HCC)    GERD (gastroesophageal reflux disease)    Hyperlipidemia    Hypertension    Pneumonia    Sciatica    Seizure disorder (HCC)    started 2003; last seizure 2009; depakote stopped 2021   Sleep apnea    doesn't wear C-Pap since 2020   Tobacco use    Past Surgical History:  Procedure Laterality Date   COLONOSCOPY  2015   ENDOVENOUS ABLATION SAPHENOUS VEIN W/ LASER Bilateral 2015   FOOT SURGERY Bilateral    4 different surgeries each foot for bunion, hammertoe, nerves (screws bilateral great toes)   REVERSE SHOULDER ARTHROPLASTY Left 04/02/2020   Procedure: REVERSE SHOULDER ARTHROPLASTY;  Surgeon: Christena Flake, MD;  Location: [facility];  Service: Orthopedics;  Laterality: Left;   REVERSE SHOULDER ARTHROPLASTY Right 02/02/2023   Procedure: REVERSE SHOULDER ARTHROPLASTY;  Surgeon: Christena Flake, MD;  Location: [facility];  Service: Orthopedics;  Laterality: Right;   SHOULDER OPEN ROTATOR CUFF REPAIR Left 05/16/2019   Procedure: ROTATOR CUFF REPAIR SHOULDER OPEN;  Surgeon: Vickki Hearing, MD;  Location: [facility];  Service: Orthopedics;  Laterality: Left;  with scalene block    TOTAL KNEE ARTHROPLASTY Left 2017   TOTAL KNEE  ARTHROPLASTY Right 2014   Patient Active Problem List   Diagnosis Date Noted   Status post reverse arthroplasty of shoulder, 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   Status post reverse arthroplasty of shoulder, left 04/02/2020   S/P arthroscopy of left shoulder 05/16/19 06/24/2019   Seizure-like activity (HCC) 03/18/2019    PCP: Coral Ceo, FNP REFERRING PROVIDER: Blanchard Mane, PA-C  ONSET DATE: 02/02/23  REFERRING DIAG:  M75.110 (ICD-10-CM) - Nontraumatic incomplete tear of rotator cuff  M25.511,G89.29 (ICD-10-CM) - Chronic right shoulder pain  M75.81 (ICD-10-CM) - Rotator cuff tendonitis, right  M75.21 (ICD-10-CM) - Biceps tendinitis on right    THERAPY DIAG:  Acute pain of right shoulder  Stiffness of right shoulder, not elsewhere classified  Other symptoms and signs involving the musculoskeletal system  Rationale for Evaluation and Treatment: Rehabilitation  SUBJECTIVE:   SUBJECTIVE STATEMENT: S: "The doctor said I'm doing really well. "  Pt accompanied by: self  PERTINENT HISTORY: Pt is a 63 y/o female s/p right reverse TSA with bicep tenodesis on 02/02/23. Pt presents in sling, reporting she is able to remove her sling at home but wears in public.   PRECAUTIONS: Shoulder; Following standard protocol. Week 4-6 (2/27-3/13): AA/ROM standing, A/ROM supine. Week 7-8 (3/20-3/27): Wall climbs, A/ROM standing; Week 9-11 (4/3-4/17): light strengthening   WEIGHT BEARING RESTRICTIONS: Yes NWB  PAIN:  Are you having pain? No  FALLS: Has patient fallen in last 6 months? No  PLOF: Independent  PATIENT GOALS: To be able to use the RUE as normally as possible.   NEXT MD VISIT: 03/17/23  OBJECTIVE:   HAND DOMINANCE: Right  ADLs: Overall ADLs: Pt reports difficulty reaching overhead and behind her back. Pt is able to complete her ADLs fairly well. Cannot lift pots and pans, carry groceries. Pt reports she is sleeping well, tries not to roll onto the right side too much.   FUNCTIONAL OUTCOME  MEASURES: Upper Extremity Functional Scale (UEFS): 58/80  UPPER EXTREMITY ROM:       Assessed in sitting, er/IR adducted  Active ROM Right eval  Shoulder flexion 121  Shoulder abduction 120  Shoulder internal rotation 90  Shoulder external rotation 65  (Blank rows = not tested)    Assessed in supine, er/IR adducted  Passive ROM Right eval  Shoulder flexion 125  Shoulder abduction 125  Shoulder internal rotation 90  Shoulder external rotation 49  (Blank rows = not tested)   UPPER EXTREMITY MMT:     Eval: Assessed on observation due to precautions  MMT Right eval  Shoulder flexion 3/5  Shoulder abduction 3/5  Shoulder internal rotation 3/5  Shoulder external rotation 3/5  (Blank rows = not tested)  SENSATION: WFL  COGNITION: Overall cognitive status: Within functional limits for tasks assessed  OBSERVATIONS: Mod fascial restrictions along medial deltoid, trapezius, and scapular regions. Significant scar tissue along incision.    TODAY'S TREATMENT:                                                                                                                              DATE:   03/23/23 -Manual therapy: myofascial release and trigger point applied to RUE biceps, deltoid, scapular region, and trapezius in order to reduce fascial restrictions and pain, as well as improve ROM. -P/ROM: flexion, abduction, er/IR, x8 -AA/ROM: seated, flexion, abduction, protraction, horizontal abduction, er/IR, x10 -A/ROM: seated, flexion, abduction, protraction, horizontal abduction, er/IR, x10 -Proximal Shoulder Exercises: paddles, criss cross, circles both direction, x10 -Scapular Strengthening: yellow band, extension, retraction, rows, x10 -UBE: level 1, pace 3.0+, 2.5' forwards and backwards  03/14/23 -Manual therapy: myofascial release and trigger point applied to RUE biceps, deltoid, scapular region, and trapezius in order to reduce fascial restrictions and pain, as well as  improve ROM. -P/ROM: flexion, abduction, er/IR, x8 -AA/ROM: supine, flexion, abduction, protraction, horizontal abduction, er/IR, x10 -A/ROM: supine, flexion, abduction, protraction, horizontal abduction, er/IR, x10 -Scapular ROM: elevation/depression, retraction/protraction, rows, x10 -AA/ROM: seated, flexion, abduction, protraction, horizontal abduction, er/IR, x10  03/09/23 -Manual therapy: myofascial release and trigger point applied to RUE biceps, deltoid, scapular region, and trapezius in order to reduce fascial restrictions and pain, as well as improve ROM. -P/ROM: flexion, abduction, er/IR, x8 -AA/ROM: supine, flexion, abduction, protraction, horizontal abduction, er/IR, x10 -Scapular ROM: elevation/depression, retraction/protraction, rows, x10 -Wall Wash: 2x60" -Thumb tacs: 2x60" -  Pulleys: flexion, abduction, x60"    PATIENT EDUCATION: Education details: Continue HEP Person educated: Patient Education method: Explanation, Demonstration, and Handouts Education comprehension: verbalized understanding and returned demonstration  HOME EXERCISE PROGRAM: Eval: AA/ROM 3/11: A/ROM  GOALS: Goals reviewed with patient? Yes   SHORT TERM GOALS: Target date: 03/31/23  Pt will be provided with and educated on HEP to improve mobility in RUE required for use during ADL completion.   Goal status: IN PROGRESS  2.  Pt will increase RUE P/ROM by 20+ degrees to improve ability to use RUE during dressing tasks with minimal compensatory techniques.   Goal status: IN PROGRESS  3.  Pt will increase RUE strength to 3+/5 to improve ability to reach for items at waist to chest height during bathing and grooming tasks.   Goal status: IN PROGRESS    LONG TERM GOALS: Target date: 05/02/23  Pt will decrease pain in RUE to 3/10 or less to improve ability to sleep for 2+ consecutive hours without waking due to pain.   Goal status: IN PROGRESS  2.  Pt will decrease RUE fascial restrictions to  min amounts or less to improve mobility required for functional reaching tasks.   Goal status: IN PROGRESS  3.  Pt will increase RUE A/ROM by 20+ degrees to improve ability to use RUE when reaching overhead or behind back during dressing and bathing tasks.   Goal status: IN PROGRESS  4.  Pt will increase RUE strength to 4+/5 or greater to improve ability to use RUE when lifting or carrying items during meal preparation/housework/yardwork tasks.   Goal status: IN PROGRESS  5.  Pt will return to highest level of function using RUE as dominant during functional task completion.   Goal status: IN PROGRESS   ASSESSMENT:  CLINICAL IMPRESSION: This session pt continuing to demonstrate improving ROM. She was able to complete seated AA/ROM and A/ROM with 85% of full ROM. Pt completed proximal shoulder exercises and low level scapular exercises this session with fair tolerance, requiring intermittent rest breaks. OT providing verbal and tactile cuing for positioning and technique throughout session.   PERFORMANCE DEFICITS: in functional skills including in functional skills including ADLs, IADLs, coordination, tone, ROM, strength, pain, fascial restrictions, muscle spasms, and UE functional use   PLAN:  OT FREQUENCY: 1x/week  OT DURATION: 8 weeks  PLANNED INTERVENTIONS: 97168 OT Re-evaluation, 97535 self care/ADL training, 08657 therapeutic exercise, 97530 therapeutic activity, 97112 neuromuscular re-education, 97140 manual therapy, 97035 ultrasound, 97014 electrical stimulation unattended, patient/family education, and DME and/or AE instructions  RECOMMENDED OTHER SERVICES: None at this time  CONSULTED AND AGREED WITH PLAN OF CARE: Patient  PLAN FOR NEXT SESSION: Follow up on HEP, AA/ROM progressing to A/ROM and strengthening as protocol allows   Trish Mage, OTR/L [phone] 03/23/2023, 10:13 AM

## 2023-03-28 ENCOUNTER — Encounter (HOSPITAL_COMMUNITY): Payer: Self-pay | Admitting: Occupational Therapy

## 2023-03-28 ENCOUNTER — Ambulatory Visit (HOSPITAL_COMMUNITY): Admitting: Occupational Therapy

## 2023-03-28 DIAGNOSIS — M25511 Pain in right shoulder: Secondary | ICD-10-CM

## 2023-03-28 DIAGNOSIS — R29898 Other symptoms and signs involving the musculoskeletal system: Secondary | ICD-10-CM

## 2023-03-28 DIAGNOSIS — M25611 Stiffness of right shoulder, not elsewhere classified: Secondary | ICD-10-CM

## 2023-03-28 NOTE — Therapy (Signed)
 OUTPATIENT OCCUPATIONAL THERAPY ORTHO TREATMENT NOTE  Patient Name: Alyssa Patrick MRN: [id number] DOB:05-13-1960, 63 y.o., female Today's Date: 03/28/2023   END OF SESSION:  OT End of Session - 03/28/23 0848     Visit Number 5    Number of Visits 8    Date for OT Re-Evaluation 05/02/23   progress note 03/31/23   Authorization Type 1) Medicare A & B 2) [employer]    Authorization Time Period no auth required    Progress Note Due on Visit 10    OT Start Time 0807    OT Stop Time [phone]    OT Time Calculation (min) 40 min    Activity Tolerance Patient tolerated treatment well    Behavior During Therapy [facility] for tasks assessed/performed              Past Medical History:  Diagnosis Date   Asthma    Diabetes mellitus type 2, insulin dependent (HCC)    GERD (gastroesophageal reflux disease)    Hyperlipidemia    Hypertension    Pneumonia    Sciatica    Seizure disorder (HCC)    started 2003; last seizure 2009; depakote stopped 2021   Sleep apnea    doesn't wear C-Pap since 2020   Tobacco use    Past Surgical History:  Procedure Laterality Date   COLONOSCOPY  2015   ENDOVENOUS ABLATION SAPHENOUS VEIN W/ LASER Bilateral 2015   FOOT SURGERY Bilateral    4 different surgeries each foot for bunion, hammertoe, nerves (screws bilateral great toes)   REVERSE SHOULDER ARTHROPLASTY Left 04/02/2020   Procedure: REVERSE SHOULDER ARTHROPLASTY;  Surgeon: Christena Flake, MD;  Location: [facility];  Service: Orthopedics;  Laterality: Left;   REVERSE SHOULDER ARTHROPLASTY Right 02/02/2023   Procedure: REVERSE SHOULDER ARTHROPLASTY;  Surgeon: Christena Flake, MD;  Location: [facility];  Service: Orthopedics;  Laterality: Right;   SHOULDER OPEN ROTATOR CUFF REPAIR Left 05/16/2019   Procedure: ROTATOR CUFF REPAIR SHOULDER OPEN;  Surgeon: Vickki Hearing, MD;  Location: [facility];  Service: Orthopedics;  Laterality: Left;  with scalene block    TOTAL KNEE ARTHROPLASTY Left 2017   TOTAL KNEE  ARTHROPLASTY Right 2014   Patient Active Problem List   Diagnosis Date Noted   Status post reverse arthroplasty of shoulder, 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   Status post reverse arthroplasty of shoulder, left 04/02/2020   S/P arthroscopy of left shoulder 05/16/19 06/24/2019   Seizure-like activity (HCC) 03/18/2019    PCP: Coral Ceo, FNP REFERRING PROVIDER: Blanchard Mane, PA-C  ONSET DATE: 02/02/23  REFERRING DIAG:  M75.110 (ICD-10-CM) - Nontraumatic incomplete tear of rotator cuff  M25.511,G89.29 (ICD-10-CM) - Chronic right shoulder pain  M75.81 (ICD-10-CM) - Rotator cuff tendonitis, right  M75.21 (ICD-10-CM) - Biceps tendinitis on right    THERAPY DIAG:  Acute pain of right shoulder  Stiffness of right shoulder, not elsewhere classified  Other symptoms and signs involving the musculoskeletal system  Rationale for Evaluation and Treatment: Rehabilitation  SUBJECTIVE:   SUBJECTIVE STATEMENT: S: "How much longer do you think I need of therapy?"  Pt accompanied by: self  PERTINENT HISTORY: Pt is a 63 y/o female s/p right reverse TSA with bicep tenodesis on 02/02/23. Pt presents in sling, reporting she is able to remove her sling at home but wears in public.   PRECAUTIONS: Shoulder; Following standard protocol. Week 4-6 (2/27-3/13): AA/ROM standing, A/ROM supine. Week 7-8 (3/20-3/27): Wall climbs, A/ROM standing; Week 9-11 (4/3-4/17): light strengthening   WEIGHT BEARING RESTRICTIONS:  Yes NWB  PAIN:  Are you having pain? No  FALLS: Has patient fallen in last 6 months? No  PLOF: Independent  PATIENT GOALS: To be able to use the RUE as normally as possible.   NEXT MD VISIT: 03/17/23  OBJECTIVE:   HAND DOMINANCE: Right  ADLs: Overall ADLs: Pt reports difficulty reaching overhead and behind her back. Pt is able to complete her ADLs fairly well. Cannot lift pots and pans, carry groceries. Pt reports she is sleeping well, tries not to roll onto the right side too much.   FUNCTIONAL  OUTCOME MEASURES: Upper Extremity Functional Scale (UEFS): 58/80  UPPER EXTREMITY ROM:       Assessed in sitting, er/IR adducted  Active ROM Right eval  Shoulder flexion 121  Shoulder abduction 120  Shoulder internal rotation 90  Shoulder external rotation 65  (Blank rows = not tested)    Assessed in supine, er/IR adducted  Passive ROM Right eval  Shoulder flexion 125  Shoulder abduction 125  Shoulder internal rotation 90  Shoulder external rotation 49  (Blank rows = not tested)   UPPER EXTREMITY MMT:     Eval: Assessed on observation due to precautions  MMT Right eval  Shoulder flexion 3/5  Shoulder abduction 3/5  Shoulder internal rotation 3/5  Shoulder external rotation 3/5  (Blank rows = not tested)  SENSATION: WFL  COGNITION: Overall cognitive status: Within functional limits for tasks assessed  OBSERVATIONS: Mod fascial restrictions along medial deltoid, trapezius, and scapular regions. Significant scar tissue along incision.    TODAY'S TREATMENT:                                                                                                                              DATE:   03/28/23 -Manual therapy: myofascial release and trigger point applied to RUE biceps, deltoid, scapular region, and trapezius in order to reduce fascial restrictions and pain, as well as improve ROM. -A/ROM: seated, flexion, abduction, protraction, horizontal abduction, er/IR, x10 -X to V arms, x10 -Goal Post Arms, x10 -Proximal Shoulder Exercises: paddles, criss cross, circles both direction, x10 -ABC's in the air -Wall climbs: flexion, abduction, x10 -UBE: level 2, 2.5' forwards and backwards  03/23/23 -Manual therapy: myofascial release and trigger point applied to RUE biceps, deltoid, scapular region, and trapezius in order to reduce fascial restrictions and pain, as well as improve ROM. -P/ROM: flexion, abduction, er/IR, x8 -AA/ROM: seated, flexion, abduction,  protraction, horizontal abduction, er/IR, x10 -A/ROM: seated, flexion, abduction, protraction, horizontal abduction, er/IR, x10 -Proximal Shoulder Exercises: paddles, criss cross, circles both direction, x10 -Scapular Strengthening: yellow band, extension, retraction, rows, x10 -UBE: level 1, pace 3.0+, 2.5' forwards and backwards  03/14/23 -Manual therapy: myofascial release and trigger point applied to RUE biceps, deltoid, scapular region, and trapezius in order to reduce fascial restrictions and pain, as well as improve ROM. -P/ROM: flexion, abduction, er/IR, x8 -AA/ROM: supine, flexion, abduction, protraction, horizontal abduction, er/IR, x10 -A/ROM: supine, flexion,  abduction, protraction, horizontal abduction, er/IR, x10 -Scapular ROM: elevation/depression, retraction/protraction, rows, x10 -AA/ROM: seated, flexion, abduction, protraction, horizontal abduction, er/IR, x10    PATIENT EDUCATION: Education details: Wall Climbs Person educated: Patient Education method: Explanation, Demonstration, and Handouts Education comprehension: verbalized understanding and returned demonstration  HOME EXERCISE PROGRAM: Eval: AA/ROM 3/11: A/ROM 3/25: Wall Climbs  GOALS: Goals reviewed with patient? Yes   SHORT TERM GOALS: Target date: 03/31/23  Pt will be provided with and educated on HEP to improve mobility in RUE required for use during ADL completion.   Goal status: IN PROGRESS  2.  Pt will increase RUE P/ROM by 20+ degrees to improve ability to use RUE during dressing tasks with minimal compensatory techniques.   Goal status: IN PROGRESS  3.  Pt will increase RUE strength to 3+/5 to improve ability to reach for items at waist to chest height during bathing and grooming tasks.   Goal status: IN PROGRESS    LONG TERM GOALS: Target date: 05/02/23  Pt will decrease pain in RUE to 3/10 or less to improve ability to sleep for 2+ consecutive hours without waking due to pain.    Goal status: IN PROGRESS  2.  Pt will decrease RUE fascial restrictions to min amounts or less to improve mobility required for functional reaching tasks.   Goal status: IN PROGRESS  3.  Pt will increase RUE A/ROM by 20+ degrees to improve ability to use RUE when reaching overhead or behind back during dressing and bathing tasks.   Goal status: IN PROGRESS  4.  Pt will increase RUE strength to 4+/5 or greater to improve ability to use RUE when lifting or carrying items during meal preparation/housework/yardwork tasks.   Goal status: IN PROGRESS  5.  Pt will return to highest level of function using RUE as dominant during functional task completion.   Goal status: IN PROGRESS   ASSESSMENT:  CLINICAL IMPRESSION: Pt continues to make good progress with active shoulder movements. She is achieving approximately 80% of full ROM, with min cuing for keeping her elbow straight. This session she continues to work on proximal shoulder strengthening/stability, which increases her fatigue requiring frequent rest breaks. Verbal and tactile cuing provided for positioning and technique throughout session.   PERFORMANCE DEFICITS: in functional skills including in functional skills including ADLs, IADLs, coordination, tone, ROM, strength, pain, fascial restrictions, muscle spasms, and UE functional use   PLAN:  OT FREQUENCY: 1x/week  OT DURATION: 8 weeks  PLANNED INTERVENTIONS: 97168 OT Re-evaluation, 97535 self care/ADL training, 78295 therapeutic exercise, 97530 therapeutic activity, 97112 neuromuscular re-education, 97140 manual therapy, 97035 ultrasound, 97014 electrical stimulation unattended, patient/family education, and DME and/or AE instructions  RECOMMENDED OTHER SERVICES: None at this time  CONSULTED AND AGREED WITH PLAN OF CARE: Patient  PLAN FOR NEXT SESSION: Follow up on HEP, AA/ROM progressing to A/ROM, proximal shoulder exercises   Trish Mage,  OTR/L [phone] 03/28/2023, 8:49 AM

## 2023-04-07 ENCOUNTER — Encounter (HOSPITAL_COMMUNITY): Payer: Self-pay | Admitting: Occupational Therapy

## 2023-04-07 ENCOUNTER — Ambulatory Visit (HOSPITAL_COMMUNITY): Attending: Family Medicine | Admitting: Occupational Therapy

## 2023-04-07 DIAGNOSIS — M25611 Stiffness of right shoulder, not elsewhere classified: Secondary | ICD-10-CM

## 2023-04-07 DIAGNOSIS — M25511 Pain in right shoulder: Secondary | ICD-10-CM

## 2023-04-07 DIAGNOSIS — R29898 Other symptoms and signs involving the musculoskeletal system: Secondary | ICD-10-CM | POA: Diagnosis present

## 2023-04-07 NOTE — Therapy (Signed)
 OUTPATIENT OCCUPATIONAL THERAPY ORTHO TREATMENT NOTE  Patient Name: Alyssa Patrick MRN: [id number] DOB:08-18-1960, 63 y.o., female Today's Date: 04/07/2023   END OF SESSION:  OT End of Session - 04/07/23 0839     Visit Number 6    Number of Visits 8    Date for OT Re-Evaluation 05/02/23    Authorization Type 1) Medicare A & B 2) [employer]    Authorization Time Period no auth required    Progress Note Due on Visit 10    OT Start Time 0805    OT Stop Time [phone]    OT Time Calculation (min) 38 min    Activity Tolerance Patient tolerated treatment well    Behavior During Therapy WFL for tasks assessed/performed               Past Medical History:  Diagnosis Date   Asthma    Diabetes mellitus type 2, insulin dependent (HCC)    GERD (gastroesophageal reflux disease)    Hyperlipidemia    Hypertension    Pneumonia    Sciatica    Seizure disorder (HCC)    started 2003; last seizure 2009; depakote stopped 2021   Sleep apnea    doesn't wear C-Pap since 2020   Tobacco use    Past Surgical History:  Procedure Laterality Date   COLONOSCOPY  2015   ENDOVENOUS ABLATION SAPHENOUS VEIN W/ LASER Bilateral 2015   FOOT SURGERY Bilateral    4 different surgeries each foot for bunion, hammertoe, nerves (screws bilateral great toes)   REVERSE SHOULDER ARTHROPLASTY Left 04/02/2020   Procedure: REVERSE SHOULDER ARTHROPLASTY;  Surgeon: Christena Flake, MD;  Location: [facility];  Service: Orthopedics;  Laterality: Left;   REVERSE SHOULDER ARTHROPLASTY Right 02/02/2023   Procedure: REVERSE SHOULDER ARTHROPLASTY;  Surgeon: Christena Flake, MD;  Location: [facility];  Service: Orthopedics;  Laterality: Right;   SHOULDER OPEN ROTATOR CUFF REPAIR Left 05/16/2019   Procedure: ROTATOR CUFF REPAIR SHOULDER OPEN;  Surgeon: Vickki Hearing, MD;  Location: [facility];  Service: Orthopedics;  Laterality: Left;  with scalene block    TOTAL KNEE ARTHROPLASTY Left 2017   TOTAL KNEE ARTHROPLASTY Right 2014    Patient Active Problem List   Diagnosis Date Noted   Status post reverse arthroplasty of shoulder, 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   Status post reverse arthroplasty of shoulder, left 04/02/2020   S/P arthroscopy of left shoulder 05/16/19 06/24/2019   Seizure-like activity (HCC) 03/18/2019    PCP: Coral Ceo, FNP REFERRING PROVIDER: Blanchard Mane, PA-C  ONSET DATE: 02/02/23  REFERRING DIAG:  M75.110 (ICD-10-CM) - Nontraumatic incomplete tear of rotator cuff  M25.511,G89.29 (ICD-10-CM) - Chronic right shoulder pain  M75.81 (ICD-10-CM) - Rotator cuff tendonitis, right  M75.21 (ICD-10-CM) - Biceps tendinitis on right    THERAPY DIAG:  Acute pain of right shoulder  Stiffness of right shoulder, not elsewhere classified  Other symptoms and signs involving the musculoskeletal system  Rationale for Evaluation and Treatment: Rehabilitation  SUBJECTIVE:   SUBJECTIVE STATEMENT: S: "Can I be done with therapy at my next appointment?"  Pt accompanied by: self  PERTINENT HISTORY: Pt is a 63 y/o female s/p right reverse TSA with bicep tenodesis on 02/02/23. Pt presents in sling, reporting she is able to remove her sling at home but wears in public.   PRECAUTIONS: Shoulder; Following standard protocol. Week 4-6 (2/27-3/13): AA/ROM standing, A/ROM supine. Week 7-8 (3/20-3/27): Wall climbs, A/ROM standing; Week 9-11 (4/3-4/17): light strengthening   WEIGHT BEARING RESTRICTIONS: Yes NWB  PAIN:  Are you having pain? No  FALLS: Has patient fallen in last 6 months? No  PLOF: Independent  PATIENT GOALS: To be able to use the RUE as normally as possible.   NEXT MD VISIT: 03/17/23  OBJECTIVE:   HAND DOMINANCE: Right  ADLs: Overall ADLs: Pt reports difficulty reaching overhead and behind her back. Pt is able to complete her ADLs fairly well. Cannot lift pots and pans, carry groceries. Pt reports she is sleeping well, tries not to roll onto the right side too much.   FUNCTIONAL OUTCOME  MEASURES: Upper Extremity Functional Scale (UEFS): 58/80  UPPER EXTREMITY ROM:       Assessed in sitting, er/IR adducted  Active ROM Right eval  Shoulder flexion 121  Shoulder abduction 120  Shoulder internal rotation 90  Shoulder external rotation 65  (Blank rows = not tested)    Assessed in supine, er/IR adducted  Passive ROM Right eval  Shoulder flexion 125  Shoulder abduction 125  Shoulder internal rotation 90  Shoulder external rotation 49  (Blank rows = not tested)   UPPER EXTREMITY MMT:     Eval: Assessed on observation due to precautions  MMT Right eval  Shoulder flexion 3/5  Shoulder abduction 3/5  Shoulder internal rotation 3/5  Shoulder external rotation 3/5  (Blank rows = not tested)  SENSATION: WFL  COGNITION: Overall cognitive status: Within functional limits for tasks assessed  OBSERVATIONS: Mod fascial restrictions along medial deltoid, trapezius, and scapular regions. Significant scar tissue along incision.    TODAY'S TREATMENT:                                                                                                                              DATE:   04/07/23 -A/ROM: seated, flexion, abduction, protraction, horizontal abduction, er/IR, x10 - UE strength: bicep curls, hammer curls, shoulder press, cross body punches x10 -Proximal Shoulder Exercises: paddles, criss cross, circles both direction, x10 - Functional reach: placing cones into cabinet on middle shelf  - UBE: level 2, 2.5' forwards and backwards  03/28/23 -Manual therapy: myofascial release and trigger point applied to RUE biceps, deltoid, scapular region, and trapezius in order to reduce fascial restrictions and pain, as well as improve ROM. -A/ROM: seated, flexion, abduction, protraction, horizontal abduction, er/IR, x10 -X to V arms, x10 -Goal Post Arms, x10 -Proximal Shoulder Exercises: paddles, criss cross, circles both direction, x10 -ABC's in the air -Wall climbs:  flexion, abduction, x10 -UBE: level 2, 2.5' forwards and backwards  03/23/23 -Manual therapy: myofascial release and trigger point applied to RUE biceps, deltoid, scapular region, and trapezius in order to reduce fascial restrictions and pain, as well as improve ROM. -P/ROM: flexion, abduction, er/IR, x8 -AA/ROM: seated, flexion, abduction, protraction, horizontal abduction, er/IR, x10 -A/ROM: seated, flexion, abduction, protraction, horizontal abduction, er/IR, x10 -Proximal Shoulder Exercises: paddles, criss cross, circles both direction, x10 -Scapular Strengthening: yellow band, extension, retraction, rows, x10 -UBE: level 1, pace 3.0+, 2.5' forwards  and backwards   PATIENT EDUCATION: Education details: Wall Climbs Person educated: Patient Education method: Explanation, Demonstration, and Handouts Education comprehension: verbalized understanding and returned demonstration  HOME EXERCISE PROGRAM: Eval: AA/ROM 3/11: A/ROM 3/25: Wall Climbs  GOALS: Goals reviewed with patient? Yes   SHORT TERM GOALS: Target date: 03/31/23  Pt will be provided with and educated on HEP to improve mobility in RUE required for use during ADL completion.   Goal status: IN PROGRESS  2.  Pt will increase RUE P/ROM by 20+ degrees to improve ability to use RUE during dressing tasks with minimal compensatory techniques.   Goal status: IN PROGRESS  3.  Pt will increase RUE strength to 3+/5 to improve ability to reach for items at waist to chest height during bathing and grooming tasks.   Goal status: IN PROGRESS    LONG TERM GOALS: Target date: 05/02/23  Pt will decrease pain in RUE to 3/10 or less to improve ability to sleep for 2+ consecutive hours without waking due to pain.   Goal status: IN PROGRESS  2.  Pt will decrease RUE fascial restrictions to min amounts or less to improve mobility required for functional reaching tasks.   Goal status: IN PROGRESS  3.  Pt will increase RUE A/ROM by  20+ degrees to improve ability to use RUE when reaching overhead or behind back during dressing and bathing tasks.   Goal status: IN PROGRESS  4.  Pt will increase RUE strength to 4+/5 or greater to improve ability to use RUE when lifting or carrying items during meal preparation/housework/yardwork tasks.   Goal status: IN PROGRESS  5.  Pt will return to highest level of function using RUE as dominant during functional task completion.   Goal status: IN PROGRESS   ASSESSMENT:  CLINICAL IMPRESSION:  Pt continues to report no pain with RUE. She did ask OT to avoid manual therapy as this was painful today, despite OT using very light pressure. She demonstrates 85% ROM while seated. Pt asking when she can be done with therapy as she feels that she is at baseline with her functional movement and strength.   PERFORMANCE DEFICITS: in functional skills including in functional skills including ADLs, IADLs, coordination, tone, ROM, strength, pain, fascial restrictions, muscle spasms, and UE functional use   PLAN:  OT FREQUENCY: 1x/week  OT DURATION: 8 weeks  PLANNED INTERVENTIONS: 97168 OT Re-evaluation, 97535 self care/ADL training, 63875 therapeutic exercise, 97530 therapeutic activity, 97112 neuromuscular re-education, 97140 manual therapy, 97035 ultrasound, 97014 electrical stimulation unattended, patient/family education, and DME and/or AE instructions  RECOMMENDED OTHER SERVICES: None at this time  CONSULTED AND AGREED WITH PLAN OF CARE: Patient  PLAN FOR NEXT SESSION: Follow up on HEP, AA/ROM progressing to A/ROM, proximal shoulder exercises   Lurena Joiner, OTR/L [phone] 04/07/2023, 8:40 AM

## 2023-04-11 ENCOUNTER — Ambulatory Visit (HOSPITAL_COMMUNITY): Admitting: Occupational Therapy

## 2023-04-11 ENCOUNTER — Encounter (HOSPITAL_COMMUNITY): Payer: Self-pay | Admitting: Occupational Therapy

## 2023-04-11 DIAGNOSIS — M25611 Stiffness of right shoulder, not elsewhere classified: Secondary | ICD-10-CM

## 2023-04-11 DIAGNOSIS — R29898 Other symptoms and signs involving the musculoskeletal system: Secondary | ICD-10-CM

## 2023-04-11 DIAGNOSIS — M25511 Pain in right shoulder: Secondary | ICD-10-CM | POA: Diagnosis not present

## 2023-04-11 NOTE — Therapy (Signed)
 OUTPATIENT OCCUPATIONAL THERAPY ORTHO TREATMENT NOTE  REASSESSMENT AND DISCHARGE  Patient Name: Alyssa Patrick MRN: [id number] DOB:1960-04-05, 63 y.o., female Today's Date: 04/11/2023  OCCUPATIONAL THERAPY DISCHARGE SUMMARY  Visits from Start of Care: 7  Current functional level related to goals / functional outcomes: See below. Pt has met all goals and is using RUE during all ADLs.    Remaining deficits: Slightly decreased strength and activity tolerance   Education / Equipment: HEP   Patient agrees to discharge. Patient goals were met. Patient is being discharged due to meeting the stated rehab goals..      END OF SESSION:  OT End of Session - 04/11/23 0950     Visit Number 7    Number of Visits 8    Date for OT Re-Evaluation 05/02/23    Authorization Type 1) Medicare A & B 2) [employer]    Authorization Time Period no auth required    Progress Note Due on Visit 10    OT Start Time [phone]    OT Stop Time 0902    OT Time Calculation (min) 10 min    Activity Tolerance Patient tolerated treatment well    Behavior During Therapy [facility] for tasks assessed/performed                Past Medical History:  Diagnosis Date   Asthma    Diabetes mellitus type 2, insulin dependent (HCC)    GERD (gastroesophageal reflux disease)    Hyperlipidemia    Hypertension    Pneumonia    Sciatica    Seizure disorder (HCC)    started 2003; last seizure 2009; depakote stopped 2021   Sleep apnea    doesn't wear C-Pap since 2020   Tobacco use    Past Surgical History:  Procedure Laterality Date   COLONOSCOPY  2015   ENDOVENOUS ABLATION SAPHENOUS VEIN W/ LASER Bilateral 2015   FOOT SURGERY Bilateral    4 different surgeries each foot for bunion, hammertoe, nerves (screws bilateral great toes)   REVERSE SHOULDER ARTHROPLASTY Left 04/02/2020   Procedure: REVERSE SHOULDER ARTHROPLASTY;  Surgeon: Christena Flake, MD;  Location: [facility];  Service: Orthopedics;  Laterality: Left;    REVERSE SHOULDER ARTHROPLASTY Right 02/02/2023   Procedure: REVERSE SHOULDER ARTHROPLASTY;  Surgeon: Christena Flake, MD;  Location: [facility];  Service: Orthopedics;  Laterality: Right;   SHOULDER OPEN ROTATOR CUFF REPAIR Left 05/16/2019   Procedure: ROTATOR CUFF REPAIR SHOULDER OPEN;  Surgeon: Vickki Hearing, MD;  Location: [facility];  Service: Orthopedics;  Laterality: Left;  with scalene block    TOTAL KNEE ARTHROPLASTY Left 2017   TOTAL KNEE ARTHROPLASTY Right 2014   Patient Active Problem List   Diagnosis Date Noted   Status post reverse arthroplasty of shoulder, 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   Status post reverse arthroplasty of shoulder, left 04/02/2020   S/P arthroscopy of left shoulder 05/16/19 06/24/2019   Seizure-like activity (HCC) 03/18/2019    PCP: Coral Ceo, FNP REFERRING PROVIDER: Blanchard Mane, PA-C  ONSET DATE: 02/02/23  REFERRING DIAG:  M75.110 (ICD-10-CM) - Nontraumatic incomplete tear of rotator cuff  M25.511,G89.29 (ICD-10-CM) - Chronic right shoulder pain  M75.81 (ICD-10-CM) - Rotator cuff tendonitis, right  M75.21 (ICD-10-CM) - Biceps tendinitis on right    THERAPY DIAG:  Acute pain of right shoulder  Stiffness of right shoulder, not elsewhere classified  Other symptoms and signs involving the musculoskeletal system  Rationale for Evaluation and Treatment: Rehabilitation  SUBJECTIVE:   SUBJECTIVE STATEMENT: S: "Can I be  done today?"  PERTINENT HISTORY: Pt is a 63 y/o female s/p right reverse TSA with bicep tenodesis on 02/02/23. Pt presents in sling, reporting she is able to remove her sling at home but wears in public.   PRECAUTIONS: Shoulder; Following standard protocol. Week 4-6 (2/27-3/13): AA/ROM standing, A/ROM supine. Week 7-8 (3/20-3/27): Wall climbs, A/ROM standing; Week 9-11 (4/3-4/17): light strengthening   WEIGHT BEARING RESTRICTIONS: Yes NWB  PAIN:  Are you having pain? No  FALLS: Has patient fallen in last 6 months? No  PLOF:  Independent  PATIENT GOALS: To be able to use the RUE as normally as possible.    OBJECTIVE:   HAND DOMINANCE: Right  ADLs: Overall ADLs: Pt reports difficulty reaching overhead and behind her back. Pt is able to complete her ADLs fairly well. Cannot lift pots and pans, carry groceries. Pt reports she is sleeping well, tries not to roll onto the right side too much.   FUNCTIONAL OUTCOME MEASURES: Upper Extremity Functional Scale (UEFS): 58/80 04/11/23: 76/80  UPPER EXTREMITY ROM:       Assessed in sitting, er/IR adducted  Active ROM Right eval Right 04/11/23  Shoulder flexion 121 140  Shoulder abduction 120 139  Shoulder internal rotation 90 90  Shoulder external rotation 65 65  (Blank rows = not tested)    Assessed in supine, er/IR adducted  Passive ROM Right eval  Shoulder flexion 125  Shoulder abduction 125  Shoulder internal rotation 90  Shoulder external rotation 49  (Blank rows = not tested)   UPPER EXTREMITY MMT:     Eval: Assessed on observation due to precautions  MMT Right eval Right 04/11/23  Shoulder flexion 3/5 5/5  Shoulder abduction 3/5 4+/5  Shoulder internal rotation 3/5 5/5  Shoulder external rotation 3/5 4+/5  (Blank rows = not tested)   OBSERVATIONS: Mod fascial restrictions along medial deltoid, trapezius, and scapular regions. Significant scar tissue along incision.    TODAY'S TREATMENT:                                                                                                                              DATE:  04/11/23 -Discussed HEP and functional use of the RUE at home. Reviewed HEP and problem-solved how to add weights. Reviewed theraband exercises   04/07/23 -A/ROM: seated, flexion, abduction, protraction, horizontal abduction, er/IR, x10 - UE strength: bicep curls, hammer curls, shoulder press, cross body punches x10 -Proximal Shoulder Exercises: paddles, criss cross, circles both direction, x10 - Functional reach: placing  cones into cabinet on middle shelf  - UBE: level 2, 2.5' forwards and backwards  03/28/23 -Manual therapy: myofascial release and trigger point applied to RUE biceps, deltoid, scapular region, and trapezius in order to reduce fascial restrictions and pain, as well as improve ROM. -A/ROM: seated, flexion, abduction, protraction, horizontal abduction, er/IR, x10 -X to V arms, x10 -Goal Post Arms, x10 -Proximal Shoulder Exercises: paddles, criss cross, circles both direction, x10 -ABC's in the  air -Wall climbs: flexion, abduction, x10 -UBE: level 2, 2.5' forwards and backwards    PATIENT EDUCATION: Education details: reviewed scapular theraband and shoulder strengthening Person educated: Patient Education method: Explanation, Demonstration, and Handouts Education comprehension: verbalized understanding and returned demonstration  HOME EXERCISE PROGRAM: Eval: AA/ROM 3/11: A/ROM 3/25: Wall Climbs 04/11/23: scapular theraband and shoulder strengthening  GOALS: Goals reviewed with patient? Yes   SHORT TERM GOALS: Target date: 03/31/23  Pt will be provided with and educated on HEP to improve mobility in RUE required for use during ADL completion.   Goal status: MET  2.  Pt will increase RUE P/ROM by 20+ degrees to improve ability to use RUE during dressing tasks with minimal compensatory techniques.   Goal status: MET  3.  Pt will increase RUE strength to 3+/5 to improve ability to reach for items at waist to chest height during bathing and grooming tasks.   Goal status: MET    LONG TERM GOALS: Target date: 05/02/23  Pt will decrease pain in RUE to 3/10 or less to improve ability to sleep for 2+ consecutive hours without waking due to pain.   Goal status: MET  2.  Pt will decrease RUE fascial restrictions to min amounts or less to improve mobility required for functional reaching tasks.   Goal status: MET  3.  Pt will increase RUE A/ROM by 20+ degrees to improve ability to  use RUE when reaching overhead or behind back during dressing and bathing tasks.   Goal status: MET  4.  Pt will increase RUE strength to 4+/5 or greater to improve ability to use RUE when lifting or carrying items during meal preparation/housework/yardwork tasks.   Goal status: MET  5.  Pt will return to highest level of function using RUE as dominant during functional task completion.   Goal status: MET   ASSESSMENT:  CLINICAL IMPRESSION:  Pt requesting to discharge today. Reassessment completed, pt has met all goals and is demonstrating RUE ROM and strength WFL. She reports she is using the RUE as dominant at home with all ADLs, has minimal pain. Pt has made great progress during therapy and is ready for discharge. HEP reviewed and pt verbalized understand of adjusting for form.  PERFORMANCE DEFICITS: in functional skills including in functional skills including ADLs, IADLs, coordination, tone, ROM, strength, pain, fascial restrictions, muscle spasms, and UE functional use   PLAN:  OT FREQUENCY: 1x/week  OT DURATION: 8 weeks  PLANNED INTERVENTIONS: 97168 OT Re-evaluation, 97535 self care/ADL training, 40981 therapeutic exercise, 97530 therapeutic activity, 97112 neuromuscular re-education, 97140 manual therapy, 97035 ultrasound, 97014 electrical stimulation unattended, patient/family education, and DME and/or AE instructions  CONSULTED AND AGREED WITH PLAN OF CARE: Patient  PLAN FOR NEXT SESSION: n/a-discharge today   Ezra Sites, OTR/L  [phone] 04/11/2023, 9:51 AM

## 2023-04-25 ENCOUNTER — Encounter (HOSPITAL_COMMUNITY): Admitting: Occupational Therapy

## 2023-05-30 ENCOUNTER — Other Ambulatory Visit: Payer: Self-pay | Admitting: Internal Medicine

## 2023-05-30 DIAGNOSIS — Z1231 Encounter for screening mammogram for malignant neoplasm of breast: Secondary | ICD-10-CM

## 2023-07-25 ENCOUNTER — Ambulatory Visit
Admission: RE | Admit: 2023-07-25 | Discharge: 2023-07-25 | Disposition: A | Discharge status: Home / Self Care | Source: Ambulatory Visit | Attending: Internal Medicine | Admitting: Internal Medicine

## 2023-07-25 DIAGNOSIS — Z1231 Encounter for screening mammogram for malignant neoplasm of breast: Secondary | ICD-10-CM | POA: Diagnosis present
